# Patient Record
Sex: Male | Born: 1955
Health system: Southern US, Community
[De-identification: ages and names within clinical notes are randomized; demographics above are authoritative.]

## PROBLEM LIST (undated history)

## (undated) DIAGNOSIS — N529 Male erectile dysfunction, unspecified: Secondary | ICD-10-CM

## (undated) DIAGNOSIS — K219 Gastro-esophageal reflux disease without esophagitis: Secondary | ICD-10-CM

## (undated) DIAGNOSIS — E291 Testicular hypofunction: Secondary | ICD-10-CM

## (undated) DIAGNOSIS — G4733 Obstructive sleep apnea (adult) (pediatric): Secondary | ICD-10-CM

## (undated) DIAGNOSIS — G4730 Sleep apnea, unspecified: Secondary | ICD-10-CM

## (undated) DIAGNOSIS — Z9989 Dependence on other enabling machines and devices: Secondary | ICD-10-CM

## (undated) DIAGNOSIS — N400 Enlarged prostate without lower urinary tract symptoms: Secondary | ICD-10-CM

## (undated) DIAGNOSIS — E785 Hyperlipidemia, unspecified: Secondary | ICD-10-CM

## (undated) DIAGNOSIS — T7840XA Allergy, unspecified, initial encounter: Secondary | ICD-10-CM

## (undated) DIAGNOSIS — M1990 Unspecified osteoarthritis, unspecified site: Secondary | ICD-10-CM

## (undated) HISTORY — DX: Unspecified osteoarthritis, unspecified site: M19.90

## (undated) HISTORY — DX: Dependence on other enabling machines and devices: Z99.89

## (undated) HISTORY — PX: CHOLECYSTECTOMY: SHX55

## (undated) HISTORY — DX: Allergy, unspecified, initial encounter: T78.40XA

## (undated) HISTORY — PX: COLONOSCOPY: SHX174

## (undated) HISTORY — DX: Benign prostatic hyperplasia without lower urinary tract symptoms: N40.0

## (undated) HISTORY — DX: Obstructive sleep apnea (adult) (pediatric): G47.33

## (undated) HISTORY — DX: Gastro-esophageal reflux disease without esophagitis: K21.9

## (undated) HISTORY — PX: PILONIDAL CYST EXCISION: SHX744

## (undated) HISTORY — PX: VASECTOMY: SHX75

## (undated) HISTORY — DX: Testicular hypofunction: E29.1

## (undated) HISTORY — PX: EYE SURGERY: SHX253

## (undated) HISTORY — DX: Male erectile dysfunction, unspecified: N52.9

## (undated) HISTORY — DX: Sleep apnea, unspecified: G47.30

## (undated) HISTORY — PX: OTHER SURGICAL HISTORY: SHX169

## (undated) HISTORY — PX: PROSTATE SURGERY: SHX751

## (undated) HISTORY — DX: Hyperlipidemia, unspecified: E78.5

## (undated) HISTORY — PX: GALLBLADDER SURGERY: SHX652

---

## 2006-10-25 ENCOUNTER — Ambulatory Visit (HOSPITAL_COMMUNITY): Admission: RE | Admit: 2006-10-25 | Discharge: 2006-10-25 | Payer: Self-pay | Admitting: *Deleted

## 2006-10-25 ENCOUNTER — Encounter (INDEPENDENT_AMBULATORY_CARE_PROVIDER_SITE_OTHER): Payer: Self-pay | Admitting: *Deleted

## 2006-12-17 ENCOUNTER — Emergency Department (HOSPITAL_COMMUNITY): Admission: EM | Admit: 2006-12-17 | Discharge: 2006-12-18 | Payer: Self-pay | Admitting: Emergency Medicine

## 2008-08-06 ENCOUNTER — Encounter: Payer: Self-pay | Admitting: Pulmonary Disease

## 2008-09-24 ENCOUNTER — Encounter: Payer: Self-pay | Admitting: Pulmonary Disease

## 2008-11-06 ENCOUNTER — Ambulatory Visit: Payer: Self-pay | Admitting: Pulmonary Disease

## 2008-11-06 DIAGNOSIS — R059 Cough, unspecified: Secondary | ICD-10-CM | POA: Insufficient documentation

## 2008-11-06 DIAGNOSIS — R05 Cough: Secondary | ICD-10-CM

## 2008-11-06 DIAGNOSIS — G4733 Obstructive sleep apnea (adult) (pediatric): Secondary | ICD-10-CM | POA: Insufficient documentation

## 2010-11-16 ENCOUNTER — Other Ambulatory Visit: Payer: Self-pay | Admitting: Family Medicine

## 2010-11-16 ENCOUNTER — Ambulatory Visit
Admission: RE | Admit: 2010-11-16 | Discharge: 2010-11-16 | Disposition: A | Discharge status: Home / Self Care | Payer: Self-pay | Source: Ambulatory Visit | Attending: Family Medicine | Admitting: Family Medicine

## 2010-11-16 DIAGNOSIS — R109 Unspecified abdominal pain: Secondary | ICD-10-CM

## 2010-11-16 MED ORDER — IOHEXOL 300 MG/ML  SOLN
125.0000 mL | Freq: Once | INTRAMUSCULAR | Status: AC | PRN
  Administered 2010-11-16: 125 mL via INTRAVENOUS

## 2010-12-18 NOTE — Op Note (Signed)
NAME:  George Garner, George Garner NO.:  [id number]   MEDICAL RECORD NO.:  [id number]          PATIENT TYPE:  AMB   LOCATION:  ENDO                         FACILITY:  [facility]   PHYSICIAN:  Georgiana Spinner, M.D.    DATE OF BIRTH:  [birth year]   DATE OF PROCEDURE:  10/25/2006  DATE OF DISCHARGE:                               OPERATIVE REPORT   PROCEDURE:  Colonoscopy.   INDICATIONS:  Colon cancer screening.   ANESTHESIA:  Fentanyl 25 mcg, Versed 2 mg.   PROCEDURE:  With the patient mildly sedated in the left lateral  decubitus position, a rectal examination was performed which was normal  to my examination.  Subsequently, the Pentax videoscopic colonoscope was  inserted into the rectum and passed under direct vision to the cecum,  identified by ileocecal valve and appendiceal orifice.  It required  abdominal pressure and rotating the patient to his back.  Subsequently,  we photographed the cecum identified by the ileocecal valve and  appendiceal orifice.  From this point, the colonoscope was slowly  withdrawn taking circumferential views of the colonic mucosa stopping  only in the rectum which appeared normal on direct and showed hemorrhoid  on retroflexed view.  The endoscope was straightened and withdrawn.  The  patient's vital signs and pulse oximeter remained stable.  The patient  tolerated procedure well without apparent complication.   FINDINGS:  Internal hemorrhoids, otherwise an unremarkable examination.   PLAN:  See endoscopy note for further details.           ______________________________  Georgiana Spinner, M.D.     GMO/MEDQ  D:  10/25/2006  T:  10/25/2006  Job:  [id number]   cc:   Clydie Braun L. Hal Hope, M.D.

## 2010-12-18 NOTE — Op Note (Signed)
NAME:  George Garner, George Garner NO.:  [id number]   MEDICAL RECORD NO.:  [id number]          PATIENT TYPE:  AMB   LOCATION:  ENDO                         FACILITY:  [facility]   PHYSICIAN:  Georgiana Spinner, M.D.    DATE OF BIRTH:  Oct 03, 1955   DATE OF PROCEDURE:  10/25/2006  DATE OF DISCHARGE:                               OPERATIVE REPORT   PROCEDURE:  Upper endoscopy.   INDICATIONS:  Gastroesophageal reflux disease.   ANESTHESIA:  Fentanyl 75 mcg, Versed 5 mg.   PROCEDURE:  With the patient mildly sedated in the left lateral  decubitus position the Pentax videoscopic endoscope was inserted in the  mouth, passed under direct vision through the esophagus which appeared  normal until we reached the distal esophagus and there were changes of  esophagitis and/or Barrett's.  We could not see the squamocolumnar  junction well.  We elected to biopsy around the parameter as best we  could.  We then entered into the stomach, fundus, body, antrum, duodenal  bulb, second portion duodenum appeared normal.  From this point the  endoscope was slowly withdrawn taking circumferential views of duodenal  mucosa until the endoscope had been pulled back into the stomach, placed  in retroflexion to view the stomach from below.  The endoscope was then  straightened and withdrawn taking circumferential views of the remaining  gastric and esophageal mucosa.  The patient's vital signs, pulse  oximeter remained stable.  The patient tolerated procedure well and left  the operating room without apparent complications.   FINDINGS:  Changes of esophagitis and/or Barrett's esophagus.  Await  biopsy report.  The patient will call me for results and follow-up with  me as an outpatient and proceed to colonoscopy as planned.           ______________________________  Georgiana Spinner, M.D.     GMO/MEDQ  D:  10/25/2006  T:  10/25/2006  Job:  [id number]   cc:   Clydie Braun L. Hal Hope, M.D.

## 2011-12-06 ENCOUNTER — Encounter: Payer: Self-pay | Admitting: Family Medicine

## 2011-12-08 NOTE — Progress Notes (Signed)
This encounter was created in error - please disregard.

## 2011-12-24 ENCOUNTER — Encounter: Payer: Self-pay | Admitting: Family Medicine

## 2011-12-24 ENCOUNTER — Ambulatory Visit (INDEPENDENT_AMBULATORY_CARE_PROVIDER_SITE_OTHER): Payer: BC Managed Care – PPO | Admitting: Family Medicine

## 2011-12-24 VITALS — BP 124/70 | HR 61 | Temp 98.0°F | Resp 16 | Ht 72.0 in | Wt 223.2 lb

## 2011-12-24 DIAGNOSIS — E785 Hyperlipidemia, unspecified: Secondary | ICD-10-CM | POA: Insufficient documentation

## 2011-12-24 DIAGNOSIS — E669 Obesity, unspecified: Secondary | ICD-10-CM

## 2011-12-24 DIAGNOSIS — B079 Viral wart, unspecified: Secondary | ICD-10-CM

## 2011-12-24 DIAGNOSIS — Z0000 Encounter for general adult medical examination without abnormal findings: Secondary | ICD-10-CM

## 2011-12-24 DIAGNOSIS — E291 Testicular hypofunction: Secondary | ICD-10-CM | POA: Insufficient documentation

## 2011-12-24 DIAGNOSIS — N400 Enlarged prostate without lower urinary tract symptoms: Secondary | ICD-10-CM

## 2011-12-24 DIAGNOSIS — K219 Gastro-esophageal reflux disease without esophagitis: Secondary | ICD-10-CM | POA: Insufficient documentation

## 2011-12-24 LAB — COMPREHENSIVE METABOLIC PANEL
Albumin: 4.2 g/dL (ref 3.5–5.2)
Alkaline Phosphatase: 61 U/L (ref 39–117)
BUN: 15 mg/dL (ref 6–23)
CO2: 31 mEq/L (ref 19–32)
Glucose, Bld: 83 mg/dL (ref 70–99)
Potassium: 4.4 mEq/L (ref 3.5–5.3)
Sodium: 144 mEq/L (ref 135–145)
Total Bilirubin: 0.4 mg/dL (ref 0.3–1.2)
Total Protein: 6.9 g/dL (ref 6.0–8.3)

## 2011-12-24 LAB — TSH: TSH: 1.434 u[IU]/mL (ref 0.350–4.500)

## 2011-12-24 LAB — PSA: PSA: 3.3 ng/mL (ref <=4.00)

## 2011-12-24 LAB — TESTOSTERONE: Testosterone: 375.91 ng/dL (ref 300–890)

## 2011-12-24 LAB — LIPID PANEL
Cholesterol: 179 mg/dL (ref 0–200)
Triglycerides: 129 mg/dL (ref <150)
VLDL: 26 mg/dL (ref 0–40)

## 2011-12-24 LAB — POCT URINALYSIS DIPSTICK
Bilirubin, UA: NEGATIVE
Glucose, UA: NEGATIVE
Ketones, UA: NEGATIVE
Leukocytes, UA: NEGATIVE
Nitrite, UA: NEGATIVE

## 2011-12-24 LAB — IFOBT (OCCULT BLOOD): IFOBT: NEGATIVE

## 2011-12-24 NOTE — Patient Instructions (Signed)
Testosterone This test is used to determine if your testosterone level is abnormal. This could be used to explain difficulty getting an erection (erectile dysfunction), inability of your partner to get pregnant (infertility), premature or delayed puberty if you are male, or the appearance of masculine physical features if you are male. PREPARATION FOR TEST A blood sample is obtained by inserting a needle into a vein in the arm. NORMAL FINDINGS  Free Testosterone: 0.3-2 pg/mL   % Free Testosterone: 0.1%-0.3%  Total Testosterone:  7 mos-9 yrs (Tanner Stage I)   Male: Less than 30 ng/dL   Male: Less than 30 ng/dL   11-91 yrs (Tanner Stage II)   Male: Less than 300 ng/dL   Male: Less than 40 ng/dL   47-57 yrs (Tanner Stage III)   Male: 170-540 ng/dL   Male: Less than 60 ng/dL   57-62 yrs (Tanner Stage IV, V)   Male: 250-910 ng/dL   Male: Less than 70 ng/dL   20 yrs and over   Male: [phone] ng/dL   Male: Less than 70 ng/dL  Ranges for normal findings may vary among different laboratories and hospitals. You should always check with your doctor after having lab work or other tests done to discuss the meaning of your test results and whether your values are considered within normal limits. MEANING OF TEST  Your caregiver will go over the test results with you and discuss the importance and meaning of your results, as well as treatment options and the need for additional tests if necessary. OBTAINING THE TEST RESULTS It is your responsibility to obtain your test results. Ask the lab or department performing the test when and how you will get your results. Document Released: 08/05/2004 Document Revised: 07/08/2011 Document Reviewed: 06/30/2008 [employer] Patient Information 2012 ExitCare, [facility]    Hypertriglyceridemia  Diet for High blood levels of Triglycerides Most fats in food are triglycerides. Triglycerides in your blood are stored as fat in your body. High  levels of triglycerides in your blood may put you at a greater risk for heart disease and stroke.  Normal triglyceride levels are less than 150 mg/dL. Borderline high levels are 150-199 mg/dl. High levels are 200 - 499 mg/dL, and very high triglyceride levels are greater than 500 mg/dL. The decision to treat high triglycerides is generally based on the level. For people with borderline or high triglyceride levels, treatment includes weight loss and exercise. Drugs are recommended for people with very high triglyceride levels. Many people who need treatment for high triglyceride levels have metabolic syndrome. This syndrome is a collection of disorders that often include: insulin resistance, high blood pressure, blood clotting problems, high cholesterol and triglycerides. TESTING PROCEDURE FOR TRIGLYCERIDES  You should not eat 4 hours before getting your triglycerides measured. The normal range of triglycerides is between 10 and 250 milligrams per deciliter (mg/dl). Some people may have extreme levels (1000 or above), but your triglyceride level may be too high if it is above 150 mg/dl, depending on what other risk factors you have for heart disease.   People with high blood triglycerides may also have high blood cholesterol levels. If you have high blood cholesterol as well as high blood triglycerides, your risk for heart disease is probably greater than if you only had high triglycerides. High blood cholesterol is one of the main risk factors for heart disease.  CHANGING YOUR DIET  Your weight can affect your blood triglyceride level. If you are more than 20% above your ideal  body weight, you may be able to lower your blood triglycerides by losing weight. Eating less and exercising regularly is the best way to combat this. Fat provides more calories than any other food. The best way to lose weight is to eat less fat. Only 30% of your total calories should come from fat. Less than 7% of your diet should  come from saturated fat. A diet low in fat and saturated fat is the same as a diet to decrease blood cholesterol. By eating a diet lower in fat, you may lose weight, lower your blood cholesterol, and lower your blood triglyceride level.  Eating a diet low in fat, especially saturated fat, may also help you lower your blood triglyceride level. Ask your dietitian to help you figure how much fat you can eat based on the number of calories your caregiver has prescribed for you.  Exercise, in addition to helping with weight loss may also help lower triglyceride levels.   Alcohol can increase blood triglycerides. You may need to stop drinking alcoholic beverages.   Too much carbohydrate in your diet may also increase your blood triglycerides. Some complex carbohydrates are necessary in your diet. These may include bread, rice, potatoes, other starchy vegetables and cereals.   Reduce "simple" carbohydrates. These may include pure sugars, candy, honey, and jelly without losing other nutrients. If you have the kind of high blood triglycerides that is affected by the amount of carbohydrates in your diet, you will need to eat less sugar and less high-sugar foods. Your caregiver can help you with this.   Adding 2-4 grams of fish oil (EPA+ DHA) may also help lower triglycerides. Speak with your caregiver before adding any supplements to your regimen.  Following the Diet  Maintain your ideal weight. Your caregivers can help you with a diet. Generally, eating less food and getting more exercise will help you lose weight. Joining a weight control group may also help. Ask your caregivers for a good weight control group in your area.  Eat low-fat foods instead of high-fat foods. This can help you lose weight too.  These foods are lower in fat. Eat MORE of these:   Dried beans, peas, and lentils.   Egg whites.   Low-fat cottage cheese.   Fish.   Lean cuts of meat, such as round, sirloin, rump, and flank (cut  extra fat off meat you fix).   Whole grain breads, cereals and pasta.   Skim and nonfat dry milk.   Low-fat yogurt.   Poultry without the skin.   Cheese made with skim or part-skim milk, such as mozzarella, parmesan, farmers', ricotta, or pot cheese.  These are higher fat foods. Eat LESS of these:   Whole milk and foods made from whole milk, such as American, blue, cheddar, monterey jack, and swiss cheese   High-fat meats, such as luncheon meats, sausages, knockwurst, bratwurst, hot dogs, ribs, corned beef, ground pork, and regular ground beef.   Fried foods.  Limit saturated fats in your diet. Substituting unsaturated fat for saturated fat may decrease your blood triglyceride level. You will need to read package labels to know which products contain saturated fats.  These foods are high in saturated fat. Eat LESS of these:   Fried pork skins.   Whole milk.   Skin and fat from poultry.   Palm oil.   Butter.   Shortening.   Cream cheese.   Tomasa Blase.   Margarines and baked goods made from listed oils.  Vegetable shortenings.   Chitterlings.   Fat from meats.   Coconut oil.   Palm kernel oil.   Lard.   Cream.   Sour cream.   Fatback.   Coffee whiteners and non-dairy creamers made with these oils.   Cheese made from whole milk.  Use unsaturated fats (both polyunsaturated and monounsaturated) moderately. Remember, even though unsaturated fats are better than saturated fats; you still want a diet low in total fat.  These foods are high in unsaturated fat:   Canola oil.   Sunflower oil.   Mayonnaise.   Almonds.   Peanuts.   Pine nuts.   Margarines made with these oils.   Safflower oil.   Olive oil.   Avocados.   Cashews.   Peanut butter.   Sunflower seeds.   Soybean oil.   Peanut oil.   Olives.   Pecans.   Walnuts.   Pumpkin seeds.  Avoid sugar and other high-sugar foods. This will decrease carbohydrates without decreasing  other nutrients. Sugar in your food goes rapidly to your blood. When there is excess sugar in your blood, your liver may use it to make more triglycerides. Sugar also contains calories without other important nutrients.  Eat LESS of these:   Sugar, brown sugar, powdered sugar, jam, jelly, preserves, honey, syrup, molasses, pies, candy, cakes, cookies, frosting, pastries, colas, soft drinks, punches, fruit drinks, and regular gelatin.   Avoid alcohol. Alcohol, even more than sugar, may increase blood triglycerides. In addition, alcohol is high in calories and low in nutrients. Ask for sparkling water, or a diet soft drink instead of an alcoholic beverage.  Suggestions for planning and preparing meals   Bake, broil, grill or roast meats instead of frying.   Remove fat from meats and skin from poultry before cooking.   Add spices, herbs, lemon juice or vinegar to vegetables instead of salt, rich sauces or gravies.   Use a non-stick skillet without fat or use no-stick sprays.   Cool and refrigerate stews and broth. Then remove the hardened fat floating on the surface before serving.   Refrigerate meat drippings and skim off fat to make low-fat gravies.   Serve more fish.   Use less butter, margarine and other high-fat spreads on bread or vegetables.   Use skim or reconstituted non-fat dry milk for cooking.   Cook with low-fat cheeses.   Substitute low-fat yogurt or cottage cheese for all or part of the sour cream in recipes for sauces, dips or congealed salads.   Use half yogurt/half mayonnaise in salad recipes.   Substitute evaporated skim milk for cream. Evaporated skim milk or reconstituted non-fat dry milk can be whipped and substituted for whipped cream in certain recipes.   Choose fresh fruits for dessert instead of high-fat foods such as pies or cakes. Fruits are naturally low in fat.  When Dining Out   Order low-fat appetizers such as fruit or vegetable juice, pasta with  vegetables or tomato sauce.   Select clear, rather than cream soups.   Ask that dressings and gravies be served on the side. Then use less of them.   Order foods that are baked, broiled, poached, steamed, stir-fried, or roasted.   Ask for margarine instead of butter, and use only a small amount.   Drink sparkling water, unsweetened tea or coffee, or diet soft drinks instead of alcohol or other sweet beverages.  QUESTIONS AND ANSWERS ABOUT OTHER FATS IN THE BLOOD: SATURATED FAT, TRANS FAT, AND CHOLESTEROL What is  trans fat? Trans fat is a type of fat that is formed when vegetable oil is hardened through a process called hydrogenation. This process helps makes foods more solid, gives them shape, and prolongs their shelf life. Trans fats are also called hydrogenated or partially hydrogenated oils.  What do saturated fat, trans fat, and cholesterol in foods have to do with heart disease? Saturated fat, trans fat, and cholesterol in the diet all raise the level of LDL "bad" cholesterol in the blood. The higher the LDL cholesterol, the greater the risk for coronary heart disease (CHD). Saturated fat and trans fat raise LDL similarly.  What foods contain saturated fat, trans fat, and cholesterol? High amounts of saturated fat are found in animal products, such as fatty cuts of meat, chicken skin, and full-fat dairy products like butter, whole milk, cream, and cheese, and in tropical vegetable oils such as palm, palm kernel, and coconut oil. Trans fat is found in some of the same foods as saturated fat, such as vegetable shortening, some margarines (especially hard or stick margarine), crackers, cookies, baked goods, fried foods, salad dressings, and other processed foods made with partially hydrogenated vegetable oils. Small amounts of trans fat also occur naturally in some animal products, such as milk products, beef, and lamb. Foods high in cholesterol include liver, other organ meats, egg yolks, shrimp,  and full-fat dairy products. How can I use the new food label to make heart-healthy food choices? Check the Nutrition Facts panel of the food label. Choose foods lower in saturated fat, trans fat, and cholesterol. For saturated fat and cholesterol, you can also use the Percent Daily Value (%DV): 5% DV or less is low, and 20% DV or more is high. (There is no %DV for trans fat.) Use the Nutrition Facts panel to choose foods low in saturated fat and cholesterol, and if the trans fat is not listed, read the ingredients and limit products that list shortening or hydrogenated or partially hydrogenated vegetable oil, which tend to be high in trans fat. POINTS TO REMEMBER: YOU NEED A LITTLE TLC (THERAPEUTIC LIFESTYLE CHANGES)  Discuss your risk for heart disease with your caregivers, and take steps to reduce risk factors.   Change your diet. Choose foods that are low in saturated fat, trans fat, and cholesterol.   Add exercise to your daily routine if it is not already being done. Participate in physical activity of moderate intensity, like brisk walking, for at least 30 minutes on most, and preferably all days of the week. No time? Break the 30 minutes into three, 10-minute segments during the day.   Stop smoking. If you do smoke, contact your caregiver to discuss ways in which they can help you quit.   Do not use street drugs.   Maintain a normal weight.   Maintain a healthy blood pressure.   Keep up with your blood work for checking the fats in your blood as directed by your caregiver.  Document Released: 05/06/2004 Document Revised: 07/08/2011 Document Reviewed: 12/02/2008 [facility] Patient Information 2012 [place], [place].      Marland KitchenKeeping you healthy  Get these tests  Blood pressure- Have your blood pressure checked once a year by your healthcare provider.  Normal blood pressure is 120/80  Weight- Have your body mass index (BMI) calculated to screen for obesity.  BMI is a measure of body  fat based on height and weight. You can also calculate your own BMI at [email or web address].  Cholesterol- Have your cholesterol checked every year.  Diabetes- Have your blood sugar checked regularly if you have high blood pressure, high cholesterol, have a family history of diabetes or if you are overweight.  Screening for Colon Cancer- Colonoscopy starting at age 84.  Screening may begin sooner depending on your family history and other health conditions. Follow up colonoscopy as directed by your Gastroenterologist.  Screening for Prostate Cancer- Both blood work (PSA) and a rectal exam help screen for Prostate Cancer.  Screening begins at age 39 with African-American men and at age 25 with Caucasian men.  Screening may begin sooner depending on your family history.  Take these medicines  Aspirin- One aspirin daily can help prevent Heart disease and Stroke.  Flu shot- Every fall.  Tetanus- Every 10 years.  Zostavax- Once after the age of 16 to prevent Shingles.  Pneumonia shot- Once after the age of 29; if you are younger than 67, ask your healthcare provider if you need a Pneumonia shot.  Take these steps  Don't smoke- If you do smoke, talk to your doctor about quitting.  For tips on how to quit, go to www.smokefree.gov or call 1-800-QUIT-NOW.  Be physically active- Exercise 5 days a week for at least 30 minutes.  If you are not already physically active start slow and gradually work up to 30 minutes of moderate physical activity.  Examples of moderate activity include walking briskly, mowing the yard, dancing, swimming, bicycling, etc.  Eat a healthy diet- Eat a variety of healthy food such as fruits, vegetables, low fat milk, low fat cheese, yogurt, lean meant, poultry, fish, beans, tofu, etc. For more information go to www.thenutritionsource.org  Drink alcohol in moderation- Limit alcohol intake to less than two drinks a day. Never drink and drive.  Dentist- Brush and floss  twice daily; visit your dentist twice a year.  Depression- Your emotional health is as important as your physical health. If you're feeling down, or losing interest in things you would normally enjoy please talk to your healthcare provider.  Eye exam- Visit your eye doctor every year.  Safe sex- If you may be exposed to a sexually transmitted infection, use a condom.  Seat belts- Seat belts can save your life; always wear one.  Smoke/Carbon Monoxide detectors- These detectors need to be installed on the appropriate level of your home.  Replace batteries at least once a year.  Skin cancer- When out in the sun, cover up and use sunscreen 15 SPF or higher.  Violence- If anyone is threatening you, please tell your healthcare provider.  Living Will/ Health care power of attorney- Speak with your healthcare provider and family.

## 2011-12-24 NOTE — Progress Notes (Signed)
Subjective:    Patient ID: George Garner, male    DOB: 30-Nov-1955, 56 y.o.   MRN: [id number]  HPI This 56 y.o. Cauc male is here for CPE and labs. He takes Zyrtec for year-round allergies and   Niaspan for elevated Trigylcerides. Currently has a "cold" and thinks he picked up a virus from his   67 y.o. son who was recently ill. He has managed to lose 30 pounds since last CPE but is "stuck  at" current weight and is concerned about this. He has seen Urology re: low Testosterone/ PSA=3.96  and nothing has been diagnosed; was told his prostate is 'large".        Pt is a Optician, dispensing, is married and has alcohol < 1x/week. He exercises 6 days/week.     Review of Systems  Constitutional: Negative.   HENT: Positive for congestion, sore throat, rhinorrhea and sinus pressure. Negative for ear pain.        Nasal discharge is yellow  Eyes: Negative.   Respiratory: Negative.   Cardiovascular: Negative.   Gastrointestinal: Negative.   Genitourinary:       Nocturia, hesitancy and dribbling  Musculoskeletal: Positive for gait problem. Negative for back pain and arthralgias.       "problems walking"  Skin: Negative for pallor and rash.       Wart on right thumb that was treated with cryo 1 1/2 years ago; it has reoccured  Neurological: Negative.   Hematological: Negative.   Psychiatric/Behavioral: Negative.        Objective:   Physical Exam  Nursing note and vitals reviewed. Constitutional: He is oriented to person, place, and time. He appears well-developed and well-nourished. No distress.  HENT:  Head: Normocephalic and atraumatic.  Right Ear: External ear normal.  Left Ear: External ear normal.  Nose: Nose normal.  Mouth/Throat: Oropharynx is clear and moist.  Eyes: Conjunctivae and EOM are normal. Pupils are equal, round, and reactive to light. No scleral icterus.  Neck: Normal range of motion. Neck supple. No JVD present.  Cardiovascular: Normal rate, normal heart sounds and intact distal  pulses.  Exam reveals no gallop and no friction rub.   No murmur heard. Pulmonary/Chest: Effort normal and breath sounds normal. No respiratory distress. He has no wheezes.  Abdominal: Soft. He exhibits no mass. There is no tenderness. There is no guarding. Hernia confirmed negative in the right inguinal area and confirmed negative in the left inguinal area.       No organomegaly  Genitourinary: Rectum normal, testes normal and penis normal. Rectal exam shows no external hemorrhoid, no internal hemorrhoid, no fissure, no mass, no tenderness and anal tone normal. Guaiac negative stool. Prostate is enlarged. Prostate is not tender. Right testis shows no mass, no swelling and no tenderness. Right testis is descended. Left testis shows no mass, no swelling and no tenderness. Left testis is descended.  Musculoskeletal: Normal range of motion. He exhibits no edema and no tenderness.  Lymphadenopathy:    He has no cervical adenopathy.       Right: No inguinal adenopathy present.       Left: No inguinal adenopathy present.  Neurological: He is alert and oriented to person, place, and time. He has normal reflexes. No cranial nerve deficit. He exhibits normal muscle tone. Coordination normal.  Skin: Skin is warm and dry. No erythema. No pallor.  Psychiatric: He has a normal mood and affect. His behavior is normal. Judgment and thought content normal.  ECG: sinus bradycardia, otherwise normal.   Procedure note: Using Cryo cannister, wart-like lesion on R thumb was treated with freeze-thaw cycle x 4     Assessment & Plan:   1. Routine general medical examination at a health care facility    2. Dyslipidemia  Comprehensive metabolic panel, Lipid panel  3. Hypogonadism male - pt has been evaluated at [facility] in May 2012 Testosterone level; Urology advised weight loss, no replacement and use of Levitra prn  4. BPH (benign prostatic hypertrophy)  PSA; conservative treatment by Urology  witsurveillance  5. Obesity (BMI 30.0-34.9)  Vitamin D, 25-hydroxy, TSH  6. Viral wart on right thumb  Cryo-destruction       RX: Zostavax given to pt

## 2011-12-26 NOTE — Progress Notes (Signed)
Quick Note:  Please call pt and advise that the following labs are abnormal...  Testosterone is still in the low normal range. HDL("good") cholesterol is too low and LDL("bad") chol is too high. Total Chol is below 200 which is very good. Triglycerides are excellent with Niaspan  You can try OTC Omega 3 Fish Oil 1200 mg 1 capsule daily taken at a different time than other medications. This may help raise your HDL; continue regular exercise.  All other labs are normal. Your PSA was 3.96 last time and is lower (3.30) this time.  Copy to pt. ______

## 2011-12-30 ENCOUNTER — Encounter: Payer: Self-pay | Admitting: Family Medicine

## 2012-01-14 ENCOUNTER — Telehealth: Payer: Self-pay

## 2012-01-14 MED ORDER — NIACIN ER (ANTIHYPERLIPIDEMIC) 500 MG PO TBCR: 500.0000 mg | EXTENDED_RELEASE_TABLET | Freq: Every day | ORAL | Status: DC

## 2012-01-14 NOTE — Telephone Encounter (Signed)
Pt is requesting refill of Niaspan. Please send to [facility] at [address]

## 2012-01-14 NOTE — Telephone Encounter (Signed)
Patient just seen recently for this-- ok to refill.

## 2012-01-26 ENCOUNTER — Ambulatory Visit (INDEPENDENT_AMBULATORY_CARE_PROVIDER_SITE_OTHER): Payer: BC Managed Care – PPO | Admitting: Emergency Medicine

## 2012-01-26 ENCOUNTER — Ambulatory Visit: Payer: BC Managed Care – PPO

## 2012-01-26 VITALS — BP 130/71 | HR 71 | Temp 98.3°F | Resp 16 | Ht 72.0 in | Wt 231.6 lb

## 2012-01-26 DIAGNOSIS — J339 Nasal polyp, unspecified: Secondary | ICD-10-CM

## 2012-01-26 DIAGNOSIS — J329 Chronic sinusitis, unspecified: Secondary | ICD-10-CM

## 2012-01-26 DIAGNOSIS — J309 Allergic rhinitis, unspecified: Secondary | ICD-10-CM

## 2012-01-26 MED ORDER — FLUTICASONE PROPIONATE 50 MCG/ACT NA SUSP: 2.0000 | Freq: Every day | NASAL | Status: DC

## 2012-01-26 MED ORDER — LEVOFLOXACIN 500 MG PO TABS: 500.0000 mg | ORAL_TABLET | Freq: Every day | ORAL | Status: AC

## 2012-01-26 MED ORDER — PREDNISONE 20 MG PO TABS: ORAL_TABLET | ORAL | Status: DC

## 2012-01-26 NOTE — Progress Notes (Signed)
  Subjective:    Patient ID: George Garner, male    DOB: [birth year], 56 y.o.   MRN: [id number]  HPI patient states that one month ago he developed sinus congestion since that time he has been unable to breathe through the left side of his nose. He thinks initial episode was triggered by cat exposure. Patient also would like a refill on his Cialis .    Review of Systems     Objective:   Physical Exam there is a large polyp present in the left side of the nose. TMs are both occluded with wax. The throat is normal chest is clear to auscultation  [facility] reading (PRIMARY) by  Dr. 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 We'll put on prednisone Dosepak as well as Flonase nasal spray. Conclusion persistent he will have to see ENT for removal of the polyp. Her to see if there is any evidence of maxillary sinus obstruction. She shows complete blockage of the left maxillary sinus. Patient will be on prednisone Dosepak Levaquin and Flonase. He is going to decide if he wants to followup here in 2 weeks for repeat film or whether he wants to see ENT and he will let us know test requested

## 2012-03-13 ENCOUNTER — Ambulatory Visit (INDEPENDENT_AMBULATORY_CARE_PROVIDER_SITE_OTHER): Payer: BC Managed Care – PPO | Admitting: Family Medicine

## 2012-03-13 VITALS — BP 124/72 | HR 69 | Temp 99.1°F | Resp 18 | Ht 72.0 in | Wt 229.0 lb

## 2012-03-13 DIAGNOSIS — J310 Chronic rhinitis: Secondary | ICD-10-CM

## 2012-03-13 DIAGNOSIS — J069 Acute upper respiratory infection, unspecified: Secondary | ICD-10-CM

## 2012-03-13 MED ORDER — IPRATROPIUM BROMIDE 0.06 % NA SOLN: 2.0000 | Freq: Four times a day (QID) | NASAL | Status: DC

## 2012-03-13 NOTE — Progress Notes (Signed)
  Subjective:    Patient ID: ABDALLAH HERN, male    DOB: Apr 20, 1956, 56 y.o.   MRN: [id number]  HPI Nasal congestion past 4 days. Some cough.  2 boys have been sick as well recently.  Using flonase - 1 spray in each nostril each day.   Seen few weeks ago - dx with nasal polyp and congestion - improved with treatment last time almost instantly. (received prednisone taper)  No fever.    Review of Systems  Constitutional: Negative for fever and chills.  HENT: Positive for congestion (clear at times, yellow at times. ), rhinorrhea and sneezing.   Respiratory: Positive for cough. Negative for shortness of breath and wheezing.        Objective:   Physical Exam  Constitutional: He is oriented to person, place, and time. He appears well-developed and well-nourished.  HENT:  Head: Normocephalic and atraumatic.  Right Ear: Tympanic membrane, external ear and ear canal normal.  Left Ear: Tympanic membrane, external ear and ear canal normal.  Nose: No rhinorrhea.  Mouth/Throat: Oropharynx is clear and moist and mucous membranes are normal. No oropharyngeal exudate or posterior oropharyngeal erythema.  Eyes: Conjunctivae are normal. Pupils are equal, round, and reactive to light.  Neck: Neck supple.  Cardiovascular: Normal rate, regular rhythm, normal heart sounds and intact distal pulses.   No murmur heard. Pulmonary/Chest: Effort normal and breath sounds normal. He has no wheezes. He has no rhonchi. He has no rales.  Abdominal: Soft. There is no tenderness.  Lymphadenopathy:    He has no cervical adenopathy.  Neurological: He is alert and oriented to person, place, and time.  Skin: Skin is warm and dry. No rash noted.  Psychiatric: He has a normal mood and affect. His behavior is normal.       Assessment & Plan:  ADONIAS DEMORE is a 56 y.o. male 1. URI (upper respiratory infection)  ipratropium (ATROVENT) 0.06 % nasal spray  2. Rhinitis  ipratropium (ATROVENT) 0.06 % nasal spray   Sx care,.   Holding off on repeat prednisone at this point, but if not improving in next week, could reconsider. rtc precautions discussed.   Patient Instructions  You can increase the flonase to 2 sprays once per day.  atrovent nasal spray as needed for congestion and runny nose. Saline nasal spray atleast 4 times per day, over the counter mucinex or mucinex DM if needed for cough.  Drink plenty of fluids.  Return to the clinic or go to the nearest emergency room if any of your symptoms worsen or new symptoms occur.

## 2012-03-13 NOTE — Patient Instructions (Signed)
You can increase the flonase to 2 sprays once per day.  atrovent nasal spray as needed for congestion and runny nose. Saline nasal spray atleast 4 times per day, over the counter mucinex or mucinex DM if needed for cough.  Drink plenty of fluids.  Return to the clinic or go to the nearest emergency room if any of your symptoms worsen or new symptoms occur.

## 2012-08-21 ENCOUNTER — Telehealth: Payer: Self-pay

## 2012-08-21 MED ORDER — NIACIN ER (ANTIHYPERLIPIDEMIC) 500 MG PO TBCR: 500.0000 mg | EXTENDED_RELEASE_TABLET | Freq: Every day | ORAL | Status: DC

## 2012-08-21 NOTE — Telephone Encounter (Signed)
The following email was submitted to your website from George Garner I have been on a prescription for Niaspan 500 mg for cholesterol.  While at another doctor, Melvyn Novas, I mentioned that I was about out, so she wrote me a prescription for it.  Now that prescription has run out.  I called in to [facility] to get it refilled, but after standing in line for over 10 minutes and not seeing any movement in the line, I gave up.  Would it be possible to call in a prescription at another pharmacy?  The [facility] would be fine.    Thanks  George Garner Date of birth: [birth year]

## 2012-08-21 NOTE — Telephone Encounter (Signed)
Sent in 1 mos RF and notified pt on home VM that we sent in RF to [facility] on [address] and that he is due for f/up before more RFs are needed.

## 2012-09-22 ENCOUNTER — Ambulatory Visit (INDEPENDENT_AMBULATORY_CARE_PROVIDER_SITE_OTHER): Payer: BC Managed Care – PPO | Admitting: Family Medicine

## 2012-09-22 VITALS — BP 132/68 | HR 66 | Temp 98.2°F | Resp 18 | Ht 72.0 in | Wt 223.0 lb

## 2012-09-22 DIAGNOSIS — E785 Hyperlipidemia, unspecified: Secondary | ICD-10-CM

## 2012-09-22 DIAGNOSIS — J069 Acute upper respiratory infection, unspecified: Secondary | ICD-10-CM

## 2012-09-22 DIAGNOSIS — J310 Chronic rhinitis: Secondary | ICD-10-CM

## 2012-09-22 DIAGNOSIS — Z13220 Encounter for screening for lipoid disorders: Secondary | ICD-10-CM

## 2012-09-22 DIAGNOSIS — J309 Allergic rhinitis, unspecified: Secondary | ICD-10-CM

## 2012-09-22 DIAGNOSIS — M542 Cervicalgia: Secondary | ICD-10-CM

## 2012-09-22 DIAGNOSIS — G5600 Carpal tunnel syndrome, unspecified upper limb: Secondary | ICD-10-CM

## 2012-09-22 LAB — COMPREHENSIVE METABOLIC PANEL
BUN: 23 mg/dL (ref 6–23)
CO2: 31 mEq/L (ref 19–32)
Calcium: 9.9 mg/dL (ref 8.4–10.5)
Chloride: 105 mEq/L (ref 96–112)
Creat: 1.06 mg/dL (ref 0.50–1.35)
Glucose, Bld: 98 mg/dL (ref 70–99)

## 2012-09-22 LAB — LIPID PANEL
LDL Cholesterol: 140 mg/dL — ABNORMAL HIGH (ref 0–99)
Triglycerides: 95 mg/dL (ref <150)
VLDL: 19 mg/dL (ref 0–40)

## 2012-09-22 MED ORDER — FLUTICASONE PROPIONATE 50 MCG/ACT NA SUSP: 2.0000 | Freq: Every day | NASAL | Status: DC

## 2012-09-22 MED ORDER — NIACIN ER (ANTIHYPERLIPIDEMIC) 500 MG PO TBCR: EXTENDED_RELEASE_TABLET | ORAL | Status: DC

## 2012-09-22 NOTE — Progress Notes (Signed)
Subjective: Patient is here for a number of things. He denies headaches. He has some decreased hearing. Always has wax in his ears and these were irrigated. His neck is a little sore. He has a history of arthritic problems there. He has a numbness and tingling into the middle fingers of his left hand. He works on a Animator a lot. When he leaned down to pick something up it seems to be the worst. He hasn't had history of hyperlipidemia. Doesn't have to keep you apprised for the niacin it is not doing him any good. He has chronic allergic rhinitis and uses medications for that, but really has not been using the nose spray regularly lately and just takes the Zyrtec OTC. He is a Musician. He says the work hardening lost weight, about 40 pounds over 6 months, but since then he is plateaued just cannot lose. He has questions about that.  Objective: Pleasant gentleman in no major distress. TMs are both occluded by [facility]. Later on it was all irrigated out drugs could be well visualized. There is a little red streak on the left eardrum, maybe where the Lexapro loose or the pressure of the spray irritated. His throat was clear. Neck little bit decreased range of motion.. Chest clear. Heart regular murmurs. Motor is normal in his hands. Sensory grossly normal. Grip is good.  Assessment: Probable carpal tunnel syndrome Headaches Cerumen impaction, irrigated Allergic rhinitis Cervical Hyperlipidemia  Plan: Check labs Wear the splint at night. If CtS symptoms persist we will need to consider getting nerve conduction studies and/or cervical imaging.

## 2012-09-22 NOTE — Patient Instructions (Addendum)
Try to use Debrox in your ears every 3 or 4 months on a scheduled basis to see if you can keep the wax from building back up  Wear the wrist splint every night for at least several weeks and see how the discomfort in the fingers does.  Try over-the-counter time released niacin and see how that does you. Ask the pharmacist if they do have a generic that is inexpensive  Take Aleve (naproxen) 2 pills twice daily with food to see if that will help you neck. Try for a couple of weeks if it is not affecting your stomach. If it helps, then start tapering the dose back.  Continued to get the exercise and work on weight loss.

## 2012-09-23 ENCOUNTER — Encounter: Payer: Self-pay | Admitting: Family Medicine

## 2012-09-25 ENCOUNTER — Ambulatory Visit (INDEPENDENT_AMBULATORY_CARE_PROVIDER_SITE_OTHER): Payer: BC Managed Care – PPO | Admitting: Physician Assistant

## 2012-09-25 DIAGNOSIS — H6690 Otitis media, unspecified, unspecified ear: Secondary | ICD-10-CM

## 2012-09-25 MED ORDER — AZITHROMYCIN 250 MG PO TABS: ORAL_TABLET | ORAL | Status: DC

## 2012-09-25 NOTE — Patient Instructions (Signed)
Continue the Flonase and resume the Atrovent nasal spray.  Get plenty of rest and drink at least 64 ounces of water daily.

## 2012-09-25 NOTE — Progress Notes (Signed)
  Subjective:    Patient ID: George Garner, male    DOB: [birth year], 57 y.o.   MRN: [id number]  HPI  This 57 y.o. male presents for evaluation of LEFT ear pain that began after having his ears irrigated 09/22/2012.  He notes that he had wax in both ears and while they were both cleared, at the end of the procedure the pain was so severe in the LEFT ear that he thought he might faint.  The LEFT ear feels full, muffled.  Not painful presently.  Reports he's never had an ear infection.  No nasal congestion, drainage, sore throat, cough.   Past Medical History  Diagnosis Date  . GERD (gastroesophageal reflux disease)   . Dyslipidemia   . Hypogonadism male   . OSA on CPAP   . Obstructive sleep apnea     Past Surgical History  Procedure Laterality Date  . Gallbladder surgery    . Pilonidal cyst excision    . Vasectomy    . Eye surgery    . Cholecystectomy      Prior to Admission medications   Medication Sig Start Date End Date Taking? Authorizing Provider  aspirin 81 MG tablet Take 81 mg by mouth daily.   Yes Historical Provider, MD  cetirizine (ZYRTEC) 10 MG tablet Take 10 mg by mouth daily.   Yes Historical Provider, MD  fluticasone (FLONASE) 50 MCG/ACT nasal spray Place 2 sprays into the nose daily. 09/22/12 09/22/13 Yes Peyton Najjar, MD  Multiple Vitamin (MULTIVITAMIN) tablet Take 1 tablet by mouth daily.   Yes Historical Provider, MD  niacin (NIASPAN) 500 MG CR tablet Take daily for lipids 09/22/12  Yes Peyton Najjar, MD  ipratropium (ATROVENT) 0.06 % nasal spray Place 2 sprays into the nose 4 (four) times daily. 03/13/12 03/13/13 NO Shade Flood, MD    Allergies  Allergen Reactions  . Penicillins Rash    History   Social History  . Marital Status: Married   Occupational History  . minister    Social History Main Topics  . Smoking status: Former Games developer  . Smokeless tobacco: Not on file  . Alcohol Use: Yes  . Drug Use: No  . Sexually Active: Yes    Birth Control/  Protection: Other-see comments     Comment: vasectomy    Family History  Problem Relation Age of Onset  . Diabetes Mother     Review of Systems As above.    Objective:   Physical Exam  Blood pressure 147/76, pulse 55, temperature 98.2 F (36.8 C), temperature source Oral, resp. rate 16, height 6' (1.829 m), weight 226 lb (102.513 kg), SpO2 98.00%. Body mass index is 30.64 kg/(m^2). Well-developed, well nourished WM who is awake, alert and oriented, in NAD. HEENT: [place]/AT, PERRL, EOMI.  Sclera and conjunctiva are clear.  EAC are patent, RIGHT TM is normal in appearance. LEFT TM is injected, dull and mildly bulging. Nasal mucosa is pink and moist. OP is clear. Neck: supple, non-tender, no lymphadenopathy, thyromegaly. Lungs: normal effort Extremities: no cyanosis, clubbing or edema. Skin: warm and dry without rash. Psychologic: good mood and appropriate affect, normal speech and behavior.     Assessment & Plan:  AOM (acute otitis media), left - Plan: azithromycin (ZITHROMAX) 250 MG tablet  Continue Flonase.  Restart Atrovent nasal spray.  Rest, fluids.  Anticipatory guidance provided.

## 2012-09-29 ENCOUNTER — Ambulatory Visit (INDEPENDENT_AMBULATORY_CARE_PROVIDER_SITE_OTHER): Payer: BC Managed Care – PPO | Admitting: Family Medicine

## 2012-09-29 VITALS — BP 134/56 | HR 82 | Temp 97.8°F | Resp 14 | Ht 71.5 in | Wt 227.0 lb

## 2012-09-29 DIAGNOSIS — H6692 Otitis media, unspecified, left ear: Secondary | ICD-10-CM

## 2012-09-29 DIAGNOSIS — H6690 Otitis media, unspecified, unspecified ear: Secondary | ICD-10-CM

## 2012-09-29 MED ORDER — CEFDINIR 300 MG PO CAPS: 300.0000 mg | ORAL_CAPSULE | Freq: Two times a day (BID) | ORAL | Status: DC

## 2012-09-29 NOTE — Progress Notes (Signed)
[facility]:  Office Visit  Chief Complaint:  Chief Complaint  Patient presents with  . Otalgia    left ear; not feeling better after antibiotics    HPI: George Garner is a 56 y.o. male who complains of  Left ear AOM, not resolved from prior cerumen irrigationa nd also azithromycin treatment. Denies fevers, chills  Past Medical History  Diagnosis Date  . GERD (gastroesophageal reflux disease)   . Dyslipidemia   . Hypogonadism male   . OSA on CPAP   . Obstructive sleep apnea    Past Surgical History  Procedure Laterality Date  . Gallbladder surgery    . Pilonidal cyst excision    . Vasectomy    . Eye surgery    . Cholecystectomy     History   Social History  . Marital Status: Married    Spouse Name: N/A    Number of Children: N/A  . Years of Education: N/A   Occupational History  . minister    Social History Main Topics  . Smoking status: Former Games developer  . Smokeless tobacco: None  . Alcohol Use: Yes  . Drug Use: No  . Sexually Active: Yes    Birth Control/ Protection: Other-see comments     Comment: vasectomy   Other Topics Concern  . None   Social History Narrative  . None   Family History  Problem Relation Age of Onset  . Diabetes Mother    Allergies  Allergen Reactions  . Penicillins Rash   Prior to Admission medications   Medication Sig Start Date End Date Taking? Authorizing Provider  aspirin 81 MG tablet Take 81 mg by mouth daily.   Yes Historical Provider, MD  azithromycin (ZITHROMAX) 250 MG tablet Take 2 tabs PO x 1 dose, then 1 tab PO QD x 4 days 09/25/12  Yes Chelle S Jeffery, PA-C  cetirizine (ZYRTEC) 10 MG tablet Take 10 mg by mouth daily.   Yes Historical Provider, MD  fluticasone (FLONASE) 50 MCG/ACT nasal spray Place 2 sprays into the nose daily. 09/22/12 09/22/13 Yes Peyton Najjar, MD  ipratropium (ATROVENT) 0.06 % nasal spray Place 2 sprays into the nose 4 (four) times daily. 03/13/12 03/13/13 Yes Shade Flood, MD   Multiple Vitamin (MULTIVITAMIN) tablet Take 1 tablet by mouth daily.   Yes Historical Provider, MD  niacin (NIASPAN) 500 MG CR tablet Take daily for lipids 09/22/12  Yes Peyton Najjar, MD     ROS: The patient denies fevers, chills, night sweats, unintentional weight loss, chest pain, palpitations, wheezing, dyspnea on exertion, nausea, vomiting, abdominal pain, dysuria, hematuria, melena, numbness, weakness, or tingling.   All other systems have been reviewed and were otherwise negative with the exception of those mentioned in the HPI and as above.    PHYSICAL EXAM: Filed Vitals:   09/29/12 0956  BP: 134/56  Pulse: 82  Temp: 97.8 F (36.6 C)  Resp: 14   Filed Vitals:   09/29/12 0956  Height: 5' 11.5" (1.816 m)  Weight: 227 lb (102.967 kg)   Body mass index is 31.22 kg/(m^2).  General: Alert, no acute distress HEENT:  Normocephalic, atraumatic, oropharynx patent. Left TM red, dull. No exudates Cardiovascular:  Regular rate and rhythm, no rubs murmurs or gallops.  No Carotid bruits, radial pulse intact. No pedal edema.  Respiratory: Clear to auscultation bilaterally.  No wheezes, rales, or rhonchi.  No cyanosis, no use of accessory musculature GI: No organomegaly, abdomen is soft and  non-tender, positive bowel sounds.  No masses. Skin: No rashes. Neurologic: Facial musculature symmetric. Psychiatric: Patient is appropriate throughout our interaction. Lymphatic: No cervical lymphadenopathy Musculoskeletal: Gait intact.   LABS: Results for orders placed in visit on 09/22/12  COMPREHENSIVE METABOLIC PANEL      Result Value Range   Sodium 141  135 - 145 mEq/L   Potassium 5.2  3.5 - 5.3 mEq/L   Chloride 105  96 - 112 mEq/L   CO2 31  19 - 32 mEq/L   Glucose, Bld 98  70 - 99 mg/dL   BUN 23  6 - 23 mg/dL   Creat 0.98  1.19 - 1.47 mg/dL   Total Bilirubin 0.7  0.3 - 1.2 mg/dL   Alkaline Phosphatase 55  39 - 117 U/L   AST 18  0 - 37 U/L   ALT 19  0 - 53 U/L   Total Protein  7.1  6.0 - 8.3 g/dL   Albumin 4.5  3.5 - 5.2 g/dL   Calcium 9.9  8.4 - 82.9 mg/dL  LIPID PANEL      Result Value Range   Cholesterol 201 (*) 0 - 200 mg/dL   Triglycerides 95  <562 mg/dL   HDL 42  >13 mg/dL   Total CHOL/HDL Ratio 4.8     VLDL 19  0 - 40 mg/dL   LDL Cholesterol 086 (*) 0 - 99 mg/dL     EKG/XRAY:   Primary read interpreted by Dr. Conley Rolls at [facility].   ASSESSMENT/PLAN: Encounter Diagnosis  Name Primary?  . Left otitis media Yes   D/w patient labs D/w patient risks and benefits of taking omnicef with pcn allergy, he states he had a mild rash as a teenager. NO SOB, CP Rx Omnicef 300 mg BID x 7 days F/u prn    [name], DO 09/29/2012 10:38 AM

## 2012-10-09 ENCOUNTER — Ambulatory Visit (INDEPENDENT_AMBULATORY_CARE_PROVIDER_SITE_OTHER): Payer: BC Managed Care – PPO | Admitting: Physician Assistant

## 2012-10-09 VITALS — BP 128/60 | HR 56 | Temp 98.1°F | Resp 16 | Ht 70.0 in | Wt 224.0 lb

## 2012-10-09 DIAGNOSIS — H9190 Unspecified hearing loss, unspecified ear: Secondary | ICD-10-CM

## 2012-10-09 DIAGNOSIS — H9193 Unspecified hearing loss, bilateral: Secondary | ICD-10-CM

## 2012-10-09 DIAGNOSIS — H6980 Other specified disorders of Eustachian tube, unspecified ear: Secondary | ICD-10-CM

## 2012-10-09 DIAGNOSIS — H6983 Other specified disorders of Eustachian tube, bilateral: Secondary | ICD-10-CM

## 2012-10-09 NOTE — Progress Notes (Signed)
   [address] [place] [address]   Phone [phone]  Subjective:    Patient ID: George Garner, male    DOB: 1955/11/24, 57 y.o.   MRN: [id number]  HPI  Pt presents to clinic with concerns regarding his ear.  Since he had an ear irrigation on 2/12 he has not felt like his hearing is back to normal.  He knows he has trouble hearing but now he can hear himself more than normal, like an echo in his head.  He is having no current nasal congestion but he is on daily Flonase and Atrovent for allergies.  Review of Systems  HENT: Positive for hearing loss. Negative for ear pain, congestion and ear discharge.   Neurological: Negative for headaches.       Objective:   Physical Exam  Vitals reviewed. Constitutional: He is oriented to person, place, and time. He appears well-developed and well-nourished.  HENT:  Head: Normocephalic and atraumatic.  Right Ear: Hearing, external ear and ear canal normal. Tympanic membrane is bulging (serous fluids, dull). Tympanic membrane is not injected and not erythematous.  Left Ear: Hearing, external ear and ear canal normal. Tympanic membrane is bulging (serous fluids, with a scab of cerumen along the inferior side of the TM).  Weber - Lateralization to R ear, pt states he does not hear anything in the L ear Rhinne - AC>BC bilaterally  Eyes: Conjunctivae are normal.  Neck: Neck supple.  Pulmonary/Chest: Effort normal.  Neurological: He is alert and oriented to person, place, and time.  Skin: Skin is warm and dry.  Psychiatric: He has a normal mood and affect. His behavior is normal. Judgment and thought content normal.    Audiometry performed and pt has moderately severe hearing loss in range > 3K bilaterally.      Assessment & Plan:  Hearing loss and ETD - pt is on Flonase but he is using half dose so he will increase his dose to 2 sprays each nostril daily  He will also take a 12h sudafed in the am.  We will do a referral for ENT due to use of  Flonase and still ETD and significant hearing loss which may improve with reduction in ETD but I do not expect it to resolve especially since the hearing loss is not reduced across all the ranges.

## 2013-02-21 ENCOUNTER — Encounter: Payer: BC Managed Care – PPO | Admitting: Family Medicine

## 2013-04-04 ENCOUNTER — Encounter: Payer: Self-pay | Admitting: Family Medicine

## 2013-04-04 ENCOUNTER — Ambulatory Visit (INDEPENDENT_AMBULATORY_CARE_PROVIDER_SITE_OTHER): Payer: BC Managed Care – PPO | Admitting: Family Medicine

## 2013-04-04 VITALS — BP 125/69 | HR 72 | Temp 98.4°F | Resp 16 | Ht 72.0 in | Wt 213.0 lb

## 2013-04-04 DIAGNOSIS — R195 Other fecal abnormalities: Secondary | ICD-10-CM

## 2013-04-04 DIAGNOSIS — N400 Enlarged prostate without lower urinary tract symptoms: Secondary | ICD-10-CM

## 2013-04-04 DIAGNOSIS — Z0000 Encounter for general adult medical examination without abnormal findings: Secondary | ICD-10-CM

## 2013-04-04 DIAGNOSIS — E785 Hyperlipidemia, unspecified: Secondary | ICD-10-CM

## 2013-04-04 LAB — LIPID PANEL
HDL: 44 mg/dL (ref >39)
LDL Cholesterol: 113 mg/dL — ABNORMAL HIGH (ref 0–99)

## 2013-04-04 LAB — POCT URINALYSIS DIPSTICK
Bilirubin, UA: NEGATIVE
Glucose, UA: NEGATIVE
Leukocytes, UA: NEGATIVE
Nitrite, UA: NEGATIVE
Urobilinogen, UA: 0.2

## 2013-04-04 LAB — COMPREHENSIVE METABOLIC PANEL
ALT: 23 U/L (ref 0–53)
AST: 24 U/L (ref 0–37)
Albumin: 4.2 g/dL (ref 3.5–5.2)
Alkaline Phosphatase: 56 U/L (ref 39–117)
BUN: 12 mg/dL (ref 6–23)
Calcium: 9.4 mg/dL (ref 8.4–10.5)
Chloride: 105 mEq/L (ref 96–112)
Potassium: 4.7 mEq/L (ref 3.5–5.3)
Sodium: 142 mEq/L (ref 135–145)

## 2013-04-04 LAB — IFOBT (OCCULT BLOOD): IFOBT: NEGATIVE

## 2013-04-04 LAB — T4, FREE: Free T4: 1.04 ng/dL (ref 0.80–1.80)

## 2013-04-04 NOTE — Progress Notes (Signed)
Subjective:    Patient ID: George Garner, male    DOB: Sep 22, 1955, 57 y.o.   MRN: [id number]  HPI  This 57 y.o. Cauc male is here for CPE; he is in good health but is not pleased that his  weight loss has stalled despite his best efforts. He would like to see 1 lb/week weight loss.  He does dome aerobic exercises several times a week but is not strength training. His caloric  intake is ~ 1900 calories/day. Another concern is abnormal stools; he reports sporadic liquid  stools not related to any particular food ingestion. He denies hematochezia or melena or  fever/chills, n/v or abd pain with liquid stools.    HCM: CRS-Current (normal).            IMM- Current.  Patient Active Problem List   Diagnosis Date Noted  . GERD (gastroesophageal reflux disease)   . Dyslipidemia   . HYPOGODADISM MALE   . OBSTRUCTIVE SLEEP APNEA 11/06/2008  . COUGH 11/06/2008    PMHx, Soc Hx and Fam Hx reviewed. Medications reconciled.   Review of Systems  Gastrointestinal:       As per HPI; upper abd pressure but no pain.  All other systems reviewed and are negative.       Objective:   Physical Exam  Nursing note and vitals reviewed. Constitutional: He is oriented to person, place, and time. Vital signs are normal. He appears well-developed and well-nourished. No distress.  HENT:  Head: Normocephalic and atraumatic.  Right Ear: Hearing, tympanic membrane, external ear and ear canal normal.  Left Ear: Hearing, tympanic membrane, external ear and ear canal normal.  Nose: Nose normal. No mucosal edema, rhinorrhea, nasal deformity or septal deviation.  Mouth/Throat: Uvula is midline, oropharynx is clear and moist and mucous membranes are normal. No oral lesions. Normal dentition. No dental caries.  Eyes: Conjunctivae and lids are normal. Pupils are equal, round, and reactive to light. No scleral icterus.  Fundoscopic exam:      The right eye shows no arteriolar narrowing, no AV nicking and no papilledema.  The right eye shows red reflex.       The left eye shows no arteriolar narrowing and no papilledema. The left eye shows red reflex.  Periodic vision eval performed by [facility] professional.  Neck: Normal range of motion and full passive range of motion without pain. Neck supple. No hepatojugular reflux and no JVD present. No spinous process tenderness and no muscular tenderness present. Normal range of motion present. No mass and no thyromegaly present.  Cardiovascular: Normal rate, regular rhythm, S1 normal, S2 normal, normal heart sounds and normal pulses.   No extrasystoles are present. PMI is not displaced.  Exam reveals no gallop and no friction rub.   No murmur heard. Pulmonary/Chest: Effort normal and breath sounds normal. No respiratory distress.  Abdominal: Soft. Normal appearance, normal aorta and bowel sounds are normal. He exhibits no distension, no abdominal bruit, no pulsatile midline mass and no mass. There is no hepatosplenomegaly. There is tenderness in the right upper quadrant. There is no rigidity, no rebound, no guarding, no CVA tenderness and negative Murphy's sign. No hernia.  Genitourinary: Rectum normal. Rectal exam shows no external hemorrhoid, no internal hemorrhoid, no fissure, no mass, no tenderness and anal tone normal. Guaiac negative stool. Prostate is enlarged. Prostate is not tender.  Musculoskeletal: Normal range of motion. He exhibits no edema and no tenderness.  Lymphadenopathy:       Head (right  side): No submental, no submandibular, no tonsillar, no posterior auricular and no occipital adenopathy present.       Head (left side): No submental, no submandibular, no tonsillar, no posterior auricular and no occipital adenopathy present.    He has no cervical adenopathy.    He has no axillary adenopathy.       Right: No inguinal and no supraclavicular adenopathy present.       Left: No inguinal and no supraclavicular adenopathy present.  Neurological: He is alert and  oriented to person, place, and time. He has normal reflexes. He is not disoriented. He displays no atrophy. No cranial nerve deficit or sensory deficit. He exhibits normal muscle tone. Coordination and gait normal.  Skin: Skin is warm, dry and intact. No bruising, no lesion and no rash noted. He is not diaphoretic. No cyanosis or erythema. No pallor. Nails show no clubbing.  Psychiatric: He has a normal mood and affect. His speech is normal and behavior is normal. Judgment and thought content normal. Cognition and memory are normal.    Results for orders placed in visit on 04/04/13  POCT URINALYSIS DIPSTICK      Result Value Range   Color, UA yellow     Clarity, UA clear     Glucose, UA neg     Bilirubin, UA neg     Ketones, UA neg     Spec Grav, UA 1.025     Blood, UA neg     pH, UA 6.0     Protein, UA trace     Urobilinogen, UA 0.2     Nitrite, UA neg     Leukocytes, UA Negative    IFOBT (OCCULT BLOOD)      Result Value Range   IFOBT Negative         Assessment & Plan:  Routine general medical examination at a health care facility - Plan: POCT urinalysis dipstick, IFOBT POC (occult bld, rslt in office)  Dyslipidemia - Continue 3 grams of Fish Oil daily. Continue regular exercise and add weight training to increase muscle mass and caloric burn. Pt declines referral to a nutritionist.  Plan: Comprehensive metabolic panel, Lipid panel  Loose stools - Plan: H. pylori antibody, IgG, T4, Free, T3, Free  Enlarged prostate without lower urinary tract symptoms (luts) - Plan: PSA

## 2013-04-04 NOTE — Patient Instructions (Addendum)
Keeping you healthy  Get these tests  Blood pressure- Have your blood pressure checked once a year by your healthcare provider.  Normal blood pressure is 120/80  Weight- Have your body mass index (BMI) calculated to screen for obesity.  BMI is a measure of body fat based on height and weight. You can also calculate your own BMI at [email or web address].  Cholesterol- Have your cholesterol checked every year.  Diabetes- Have your blood sugar checked regularly if you have high blood pressure, high cholesterol, have a family history of diabetes or if you are overweight.  Screening for Colon Cancer- Colonoscopy starting at age 78.  Screening may begin sooner depending on your family history and other health conditions. Follow up colonoscopy as directed by your Gastroenterologist.  Screening for Prostate Cancer- Both blood work (PSA) and a rectal exam help screen for Prostate Cancer.  Screening begins at age 53 with African-American men and at age 60 with Caucasian men.  Screening may begin sooner depending on your family history.  Take these medicines  Aspirin- One aspirin daily can help prevent Heart disease and Stroke.  Flu shot- Every fall.  Tetanus- Every 10 years. Tdap was given in 2007; next Tetanus is due in 2017.  Zostavax- Once after the age of 35 to prevent Shingles.  Pneumonia shot- Once after the age of 11; if you are younger than 23, ask your healthcare provider if you need a Pneumonia shot.  Take these steps  Don't smoke- If you do smoke, talk to your doctor about quitting.  For tips on how to quit, go to www.smokefree.gov or call 1-800-QUIT-NOW.  Be physically active- Exercise 5 days a week for at least 30 minutes.  If you are not already physically active start slow and gradually work up to 30 minutes of moderate physical activity.  Examples of moderate activity include walking briskly, mowing the yard, dancing, swimming, bicycling, etc.  Eat a healthy diet- Eat a  variety of healthy food such as fruits, vegetables, low fat milk, low fat cheese, yogurt, lean meant, poultry, fish, beans, tofu, etc. For more information go to www.thenutritionsource.org  Drink alcohol in moderation- Limit alcohol intake to less than two drinks a day. Never drink and drive.  Dentist- Brush and floss twice daily; visit your dentist twice a year.  Depression- Your emotional health is as important as your physical health. If you're feeling down, or losing interest in things you would normally enjoy please talk to your healthcare provider.  Eye exam- Visit your eye doctor every year.  Safe sex- If you may be exposed to a sexually transmitted infection, use a condom.  Seat belts- Seat belts can save your life; always wear one.  Smoke/Carbon Monoxide detectors- These detectors need to be installed on the appropriate level of your home.  Replace batteries at least once a year.  Skin cancer- When out in the sun, cover up and use sunscreen 15 SPF or higher.  Violence- If anyone is threatening you, please tell your healthcare provider.  Living Will/ Health care power of attorney- Speak with your healthcare provider and family.   Exercise to Lose Weight Exercise and a healthy diet may help you lose weight. Your doctor may suggest specific exercises. EXERCISE IDEAS AND TIPS  Choose low-cost things you enjoy doing, such as walking, bicycling, or exercising to workout videos.  Take stairs instead of the elevator.  Walk during your lunch break.  Park your car further away from work or school.  Go to  a gym or an exercise class.  Start with 5 to 10 minutes of exercise each day. Build up to 30 minutes of exercise 4 to 6 days a week.  Wear shoes with good support and comfortable clothes.  Stretch before and after working out.  Work out until you breathe harder and your heart beats faster.  Drink extra water when you exercise.  Do not do so much that you hurt yourself,  feel dizzy, or get very short of breath. Exercises that burn about 150 calories:  Running 1  miles in 15 minutes.  Playing volleyball for 45 to 60 minutes.  Washing and waxing a car for 45 to 60 minutes.  Playing touch football for 45 minutes.  Walking 1  miles in 35 minutes.  Pushing a stroller 1  miles in 30 minutes.  Playing basketball for 30 minutes.  Raking leaves for 30 minutes.  Bicycling 5 miles in 30 minutes.  Walking 2 miles in 30 minutes.  Dancing for 30 minutes.  Shoveling snow for 15 minutes.  Swimming laps for 20 minutes.  Walking up stairs for 15 minutes.  Bicycling 4 miles in 15 minutes.  Gardening for 30 to 45 minutes.  Jumping rope for 15 minutes.  Washing windows or floors for 45 to 60 minutes. Document Released: 08/21/2010 Document Revised: 10/11/2011 Document Reviewed: 08/21/2010 [facility] Patient Information 2014 [place], [place].  I have suggested you step up your strength training in order to increase your muscle mass and increase the amount of calories you burn per workout. A reasonable weight loss goal is 1-2 pound to 1 pound per week. You have lost 10 pounds since May 2013.    Helicobacter Pylori and Ulcer Disease An ulcer may be in your stomach (gastric ulcer) or in the first part of your small bowel, which is called the duodenum (duodenal ulcer). An ulcer is a break in the stomach or duodenum lining. The break wears down into the deeper tissue. Helicobacter pylori (H. pylori) is a type of germ (bacteria) that may cause the majority of gastric or duodenal ulcers. CAUSES   A germ (bacterium). H. pylori can weaken the protective mucous coating of the stomach and duodenum. This allows acid to get through to the sensitive lining of the stomach or duodenum and an ulcer can then form.  Certain medications.  Using substances that can bother the lining of the stomach (alcohol, tobacco or medications such as Advil or Motrin) in the presence of  H.pylori infection. This can increase the chances of getting an ulcer.  Cancer (rarely). Most people infected with H. pylori do not get ulcers. It is not known how people catch H. pylori. It may be through food or water. H. pylori has been found in the saliva of some infected people. Therefore, the bacteria may also spread through mouth-to-mouth contact such as kissing. SYMPTOMS  The problems (symptoms) of ulcer disease are usually:  A burning or gnawing of the mid-upper belly (abdomen). This is often worse on an empty stomach. It may get better with food. This may be associated with feeling sick to your stomach (nausea), bloating and vomiting.  If the ulcer results in bleeding, it can cause:  Black, tarry stools.  Throwing up bright red blood.  Throwing up coffee ground looking materials. With severe bleeding, there may be loss of consciousness and shock. Besides ulcer disease, H. pylori can also cause chronic gastritis (irritation of the lining of the stomach without ulcer) or stomach acid-type discomfort. You may  not have symptoms even though you have an H. pylori infection. Although this is an infection, you may not have usual infection symptoms (such as fever). DIAGNOSIS  Ulcer disease can be diagnosed in many different ways. If you have an ulcer, it is important to know whether or not it is caused by H. Pylori. Treatment for an ulcer caused by H. pylori is different from that for an ulcer with other causes. The best way to detect H. pylori is taking tissue directly from the ulcer during an endoscopy test.   An endoscopy is an exam that uses an endoscope. This is a thin, lighted tube with a small camera on the end. It is like a flexible telescope. The patient is given a drug to make them calm (sedative). The caregiver eases the endoscope into the mouth and down the throat to the stomach and duodenum. This allows the doctor to see the lining of the esophagus, stomach and duodenum.  If an  endoscopy is not needed, then H. pylori can be detected with tests of the blood, stool or even breath. TREATMENT   H. pylori peptic ulcer treatment usually involves a combination of:  Medicines that kill germs (antibiotics).  Acid suppressors.  Stomach protectors.  The use of only one medication to treat H. pylori is not recommended. The most proven treatment is a 2 week course of treatment called triple therapy. It involves taking two antibiotics to kill the bacteria and either an acid suppressor or stomach-lining shield. Two-week triple therapy reduces ulcer symptoms, kills the bacteria, and prevents ulcers from coming back in many patients.  Unfortunately, patients may find triple therapy hard to do. This is because it involves taking as many as 20 pills a day. Also, the antibiotics used in triple therapy may cause mild side effects. These include nausea, vomiting, diarrhea, dark stools, a metallic taste in the mouth, dizziness, headache and yeast infections in women. Talk to your caregiver if you have any of these side effects. HOME CARE INSTRUCTIONS   Take your medications as directed and for as long as prescribed. Contact your caregiver if you have problems or side effects from your medications.  Continue regular work and usual activities unless told otherwise by your caregiver.  Avoid tobacco, alcohol and caffeine. Tobacco use will decrease and slow healing.  Avoid medications that are harmful. This includes aspirin and NSAIDS such as ibuprofen and naproxen.  Avoid foods that seem to aggravate or cause discomfort.  There are many over-the-counter products available to control stomach acid and other symptoms. Discuss these with your caregiver before using them. Do not  stop taking prescription medications for over-the-counter medications without talking with your caregiver.  Special diets are not usually needed.  Keep any follow-up appointments and blood tests as directed. SEEK  MEDICAL CARE IF:   Your pain or other ulcer symptoms do not improve within a few days of starting treatment.  You develop diarrhea. This can be a problem related to certain treatments.  You have ongoing indigestion or heartburn even if your main ulcer symptoms are improved.  You think you have any side effects from your medications or if you do not understand how to use your medications right. SEEK IMMEDIATE MEDICAL CARE IF:  Any of the following happen:  You develop bright red, rectal bleeding.  You develop dark black, tarry stools.  You throw up (vomit) blood.  You become light-headed, weak, have fainting episodes, or become sweaty, cold and clammy.  You have severe  abdominal pain not controlled by medications. Do not take pain medications unless ordered by your caregiver. MAKE SURE YOU:   Understand these instructions.  Will watch your condition.  Will get help right away if you are not doing well or get worse. Document Released: 10/09/2003 Document Revised: 10/11/2011 Document Reviewed: 03/07/2008 [facility] Patient Information 2013 [place], [place].

## 2013-04-05 LAB — H. PYLORI ANTIBODY, IGG: H Pylori IgG: 1.22 {ISR} — ABNORMAL HIGH

## 2013-04-09 ENCOUNTER — Other Ambulatory Visit: Payer: Self-pay | Admitting: Family Medicine

## 2013-04-09 DIAGNOSIS — N400 Enlarged prostate without lower urinary tract symptoms: Secondary | ICD-10-CM

## 2013-04-09 DIAGNOSIS — R972 Elevated prostate specific antigen [PSA]: Secondary | ICD-10-CM

## 2013-04-09 MED ORDER — CLARITHROMYCIN 500 MG PO TABS: ORAL_TABLET | ORAL | Status: DC

## 2013-04-09 MED ORDER — OMEPRAZOLE 20 MG PO CPDR: DELAYED_RELEASE_CAPSULE | ORAL | Status: DC

## 2013-04-09 NOTE — Progress Notes (Signed)
Quick Note:  Please contact pt and advise that the following labs are abnormal...  Blood test for bacteria in the stomach is positive; I am prescribing 2 medications; take the Omeprazole as directed for 8 weeks total and the antibiotic for 2 weeks only.  Prostate blood test (PSA) is slightly above normal. I am referring you to a Urologist for further evaluation.  All other tests are normal.  Copy to pt. ______

## 2013-05-11 ENCOUNTER — Ambulatory Visit (INDEPENDENT_AMBULATORY_CARE_PROVIDER_SITE_OTHER): Payer: BC Managed Care – PPO | Admitting: Internal Medicine

## 2013-05-11 VITALS — BP 144/74 | HR 65 | Temp 98.4°F | Resp 18 | Wt 218.0 lb

## 2013-05-11 DIAGNOSIS — R21 Rash and other nonspecific skin eruption: Secondary | ICD-10-CM

## 2013-05-11 DIAGNOSIS — R194 Change in bowel habit: Secondary | ICD-10-CM

## 2013-05-11 DIAGNOSIS — R141 Gas pain: Secondary | ICD-10-CM

## 2013-05-11 DIAGNOSIS — R198 Other specified symptoms and signs involving the digestive system and abdomen: Secondary | ICD-10-CM

## 2013-05-11 DIAGNOSIS — R42 Dizziness and giddiness: Secondary | ICD-10-CM

## 2013-05-11 DIAGNOSIS — B360 Pityriasis versicolor: Secondary | ICD-10-CM

## 2013-05-11 DIAGNOSIS — R140 Abdominal distension (gaseous): Secondary | ICD-10-CM

## 2013-05-11 LAB — POCT CBC
HCT, POC: 42.8 % — AB (ref 43.5–53.7)
Lymph, poc: 2.1 (ref 0.6–3.4)
MCHC: 32.2 g/dL (ref 31.8–35.4)
MCV: 95.7 fL (ref 80–97)
MID (cbc): 0.4 (ref 0–0.9)
POC Granulocyte: 4.8 (ref 2–6.9)
POC LYMPH PERCENT: 29.4 %L (ref 10–50)
Platelet Count, POC: 172 10*3/uL (ref 142–424)
RDW, POC: 13.7 %

## 2013-05-11 LAB — GLUCOSE, POCT (MANUAL RESULT ENTRY): POC Glucose: 71 mg/dl (ref 70–99)

## 2013-05-11 LAB — LIPASE: Lipase: 23 U/L (ref 0–75)

## 2013-05-11 MED ORDER — TRIAMCINOLONE ACETONIDE 0.1 % EX CREA: TOPICAL_CREAM | Freq: Two times a day (BID) | CUTANEOUS | Status: DC

## 2013-05-11 MED ORDER — FLUCONAZOLE 200 MG PO TABS: 200.0000 mg | ORAL_TABLET | Freq: Every day | ORAL | Status: DC

## 2013-05-11 NOTE — Progress Notes (Signed)
Subjective:    Patient ID: George Garner, male    DOB: 12/30/1955, 57 y.o.   MRN: [id number]  HPI presents today with ongoing issue upset stomach, multiple BMs, sometimes diarrhea sometimes normal. These symptoms have not improved since 04/04/13. He was treated with antibiotic at that time due to bacteria in stomach. Up until August was always had regular am BM. Had colonoscopy 5 years ago. No nausea, vomiting, anorexia. Is losing weight but trying to lose.Occ abdominal pain , does have H. Pylori, txed but feels no better and is on omeprazole now.  2) rash L side. Itchy and is also on R side as well. Just on front of chestt, is fading.  3)dizziness x 2 weeks. More when he stands up from sitting position. Is postural only. No recent cbc found in record, hemosure was negative.    Review of Systems     Objective:   Physical Exam  Vitals reviewed. Constitutional: He is oriented to person, place, and time. He appears well-developed and well-nourished. No distress.  HENT:  Head: Normocephalic.  Eyes: EOM are normal. No scleral icterus.  Neck: Neck supple.  Cardiovascular: Normal rate, regular rhythm, normal heart sounds and intact distal pulses.   Pulmonary/Chest: Effort normal and breath sounds normal.  Abdominal: Soft. Bowel sounds are normal. He exhibits no distension and no mass. There is no tenderness. There is no rebound and no guarding.  Neurological: He is alert and oriented to person, place, and time. He exhibits normal muscle tone. Coordination normal.  Skin: Rash noted. Rash is maculopapular. There is erythema.     Itchy, irregular paps and macs, scaly  Back covered with tinea versicolor  Psychiatric: He has a normal mood and affect. His behavior is normal.   EKG is normal except low voltage  Results for orders placed in visit on 05/11/13  POCT CBC      Result Value Range   WBC 7.3  4.6 - 10.2 K/uL   Lymph, poc 2.1  0.6 - 3.4   POC LYMPH PERCENT 29.4  10 - 50 %L   MID (cbc)  0.4  0 - 0.9   POC MID % 5.5  0 - 12 %M   POC Granulocyte 4.8  2 - 6.9   Granulocyte percent 65.1  37 - 80 %G   RBC 4.47 (*) 4.69 - 6.13 M/uL   Hemoglobin 13.8 (*) 14.1 - 18.1 g/dL   HCT, POC 40.9 (*) 81.1 - 53.7 %   MCV 95.7  80 - 97 fL   MCH, POC 30.9  27 - 31.2 pg   MCHC 32.2  31.8 - 35.4 g/dL   RDW, POC 91.4     Platelet Count, POC 172  142 - 424 K/uL   MPV 8.3  0 - 99.8 fL  GLUCOSE, POCT (MANUAL RESULT ENTRY)      Result Value Range   POC Glucose 71  70 - 99 mg/dl  POCT GLYCOSYLATED HEMOGLOBIN (HGB A1C)      Result Value Range   Hemoglobin A1C 4.9          Assessment & Plan:  Fluconazole 200mg  1 po qd  Week/Triamcinolone cream chest 1 week Refer to GI [place] for GI issues and mild anemia Patient advised to return in one month for follow up sooner if worse  Meds ordered this encounter  Medications  . fluconazole (DIFLUCAN) 200 MG tablet    Sig: Take 1 tablet (200 mg total) by mouth daily.  Dispense:  7 tablet    Refill:  0  . triamcinolone cream (KENALOG) 0.1 %    Sig: Apply topically 2 (two) times daily.    Dispense:  30 g    Refill:  0

## 2013-05-11 NOTE — Progress Notes (Signed)
  Subjective:    Patient ID: George Garner, male    DOB: 20-Jul-1956, 57 y.o.   MRN: [id number]  HPI    Review of Systems     Objective:   Physical Exam        Assessment & Plan:

## 2013-05-11 NOTE — Patient Instructions (Addendum)
You will be referred to GI doctor for the abdominal pain and anemia. You will get a call regarding this. If you get worse or dizziness becomes more persistent let us know. To emergency room if severe. Use the cream on your rash, and take the diflucan daily for seven days, this should help the rash very quickly. Return to clinic in one month for a recheck  Vertigo Vertigo means you feel like you or your surroundings are moving when they are not. Vertigo can be dangerous if it occurs when you are at work, driving, or performing difficult activities.  CAUSES  Vertigo occurs when there is a conflict of signals sent to your brain from the visual and sensory systems in your body. There are many different causes of vertigo, including: Infections, especially in the inner ear. A bad reaction to a drug or misuse of alcohol and medicines. Withdrawal from drugs or alcohol. Rapidly changing positions, such as lying down or rolling over in bed. A migraine headache. Decreased blood flow to the brain. Increased pressure in the brain from a head injury, infection, tumor, or bleeding. SYMPTOMS  You may feel as though the world is spinning around or you are falling to the ground. Because your balance is upset, vertigo can cause nausea and vomiting. You may have involuntary eye movements (nystagmus). DIAGNOSIS  Vertigo is usually diagnosed by physical exam. If the cause of your vertigo is unknown, your caregiver may perform imaging tests, such as an MRI scan (magnetic resonance imaging). TREATMENT  Most cases of vertigo resolve on their own, without treatment. Depending on the cause, your caregiver may prescribe certain medicines. If your vertigo is related to body position issues, your caregiver may recommend movements or procedures to correct the problem. In rare cases, if your vertigo is caused by certain inner ear problems, you may need surgery. HOME CARE INSTRUCTIONS  Follow your caregiver's  instructions. Avoid driving. Avoid operating heavy machinery. Avoid performing any tasks that would be dangerous to you or others during a vertigo episode. Tell your caregiver if you notice that certain medicines seem to be causing your vertigo. Some of the medicines used to treat vertigo episodes can actually make them worse in some people. SEEK IMMEDIATE MEDICAL CARE IF:  Your medicines do not relieve your vertigo or are making it worse. You develop problems with talking, walking, weakness, or using your arms, hands, or legs. You develop severe headaches. Your nausea or vomiting continues or gets worse. You develop visual changes. A family member notices behavioral changes. Your condition gets worse. MAKE SURE YOU: Understand these instructions. Will watch your condition. Will get help right away if you are not doing well or get worse. Document Released: 04/28/2005 Document Revised: 10/11/2011 Document Reviewed: 02/04/2011 [facility] Patient Information 2014 [place], [place].    Tinea Versicolor Tinea versicolor is a common yeast infection of the skin. This condition becomes known when the yeast on our skin starts to overgrow (yeast is a normal inhabitant on our skin). This condition is noticed as white or light brown patches on brown skin, and is more evident in the summer on tanned skin. These areas are slightly scaly if scratched. The light patches from the yeast become evident when the yeast creates "holes in your suntan". This is most often noticed in the summer. The patches are usually located on the chest, back, pubis, neck and body folds. However, it may occur on any area of body. Mild itching and inflammation (redness or soreness) may be  present. DIAGNOSIS  The diagnosisof this is made clinically (by looking). Cultures from samples are usually not needed. Examination under the microscope may help. However, yeast is normally found on skin. The diagnosis still remains clinical.  Examination under Wood's Ultraviolet Light can determine the extent of the infection. TREATMENT  This common infection is usually only of cosmetic (only a concern to your appearance). It is easily treated with dandruff shampoo used during showers or bathing. Vigorous scrubbing will eliminate the yeast over several days time. The light areas in your skin may remain for weeks or months after the infection is cured unless your skin is exposed to sunlight. The lighter or darker spots caused by the fungus that remain after complete treatment are not a sign of treatment failure; it will take a long time to resolve. Your caregiver may recommend a number of commercial preparations or medication by mouth if home care is not working. Recurrence is common and preventative medication may be necessary. This skin condition is not highly contagious. Special care is not needed to protect close friends and family members. Normal hygiene is usually enough. Follow up is required only if you develop complications (such as a secondary infection from scratching), if recommended by your caregiver, or if no relief is obtained from the preparations used. Document Released: 07/16/2000 Document Revised: 10/11/2011 Document Reviewed: 08/28/2008 [facility] Patient Information 2014 [place], [place]. Gastroesophageal Reflux Disease, Adult Gastroesophageal reflux disease (GERD) happens when acid from your stomach flows up into the esophagus. When acid comes in contact with the esophagus, the acid causes soreness (inflammation) in the esophagus. Over time, GERD may create small holes (ulcers) in the lining of the esophagus. CAUSES   Increased body weight. This puts pressure on the stomach, making acid rise from the stomach into the esophagus.  Smoking. This increases acid production in the stomach.  Drinking alcohol. This causes decreased pressure in the lower esophageal sphincter (valve or ring of muscle between the esophagus and  stomach), allowing acid from the stomach into the esophagus.  Late evening meals and a full stomach. This increases pressure and acid production in the stomach.  A malformed lower esophageal sphincter. Sometimes, no cause is found. SYMPTOMS   Burning pain in the lower part of the mid-chest behind the breastbone and in the mid-stomach area. This may occur twice a week or more often.  Trouble swallowing.  Sore throat.  Dry cough.  Asthma-like symptoms including chest tightness, shortness of breath, or wheezing. DIAGNOSIS  Your caregiver may be able to diagnose GERD based on your symptoms. In some cases, X-rays and other tests may be done to check for complications or to check the condition of your stomach and esophagus. TREATMENT  Your caregiver may recommend over-the-counter or prescription medicines to help decrease acid production. Ask your caregiver before starting or adding any new medicines.  HOME CARE INSTRUCTIONS   Change the factors that you can control. Ask your caregiver for guidance concerning weight loss, quitting smoking, and alcohol consumption.  Avoid foods and drinks that make your symptoms worse, such as:  Caffeine or alcoholic drinks.  Chocolate.  Peppermint or mint flavorings.  Garlic and onions.  Spicy foods.  Citrus fruits, such as oranges, lemons, or limes.  Tomato-based foods such as sauce, chili, salsa, and pizza.  Fried and fatty foods.  Avoid lying down for the 3 hours prior to your bedtime or prior to taking a nap.  Eat small, frequent meals instead of large meals.  Wear loose-fitting clothing.  Do not wear anything tight around your waist that causes pressure on your stomach.  Raise the head of your bed 6 to 8 inches with wood blocks to help you sleep. Extra pillows will not help.  Only take over-the-counter or prescription medicines for pain, discomfort, or fever as directed by your caregiver.  Do not take aspirin, ibuprofen, or other  nonsteroidal anti-inflammatory drugs (NSAIDs). SEEK IMMEDIATE MEDICAL CARE IF:   You have pain in your arms, neck, jaw, teeth, or back.  Your pain increases or changes in intensity or duration.  You develop nausea, vomiting, or sweating (diaphoresis).  You develop shortness of breath, or you faint.  Your vomit is green, yellow, black, or looks like coffee grounds or blood.  Your stool is red, bloody, or black. These symptoms could be signs of other problems, such as heart disease, gastric bleeding, or esophageal bleeding. MAKE SURE YOU:   Understand these instructions.  Will watch your condition.  Will get help right away if you are not doing well or get worse. Document Released: 04/28/2005 Document Revised: 10/11/2011 Document Reviewed: 02/05/2011 [facility] Patient Information 2014 [employer] [place], [place]. Bloating Bloating is the feeling of fullness in your belly. You may feel as though your pants are too tight. Often the cause of bloating is overeating, retaining fluids, or having gas in your bowel. It is also caused by swallowing air and eating foods that cause gas. Irritable bowel syndrome is one of the most common causes of bloating. Constipation is also a common cause. Sometimes more serious problems can cause bloating. SYMPTOMS  Usually there is a feeling of fullness, as though your abdomen is bulged out. There may be mild discomfort.  DIAGNOSIS  Usually no particular testing is necessary for most bloating. If the condition persists and seems to become worse, your caregiver may do additional testing.  TREATMENT   There is no direct treatment for bloating.  Do not put gas into the bowel. Avoid chewing gum and sucking on candy. These tend to make you swallow air. Swallowing air can also be a nervous habit. Try to avoid this.  Avoiding high residue diets will help. Eat foods with soluble fibers (examples include root vegetables, apples, or barley) and substitute dairy products with  soy and rice products. This helps irritable bowel syndrome.  If constipation is the cause, then a high residue diet with more fiber will help.  Avoid carbonated beverages.  Over-the-counter preparations are available that help reduce gas. Your pharmacist can help you with this. SEEK MEDICAL CARE IF:   Bloating continues and seems to be getting worse.  You notice a weight gain.  You have a weight loss but the bloating is getting worse.  You have changes in your bowel habits or develop nausea or vomiting. SEEK IMMEDIATE MEDICAL CARE IF:   You develop shortness of breath or swelling in your legs.  You have an increase in abdominal pain or develop chest pain. Document Released: 05/19/2006 Document Revised: 10/11/2011 Document Reviewed: 07/07/2007 [facility] Patient Information 2014 [place], [place].

## 2013-05-16 ENCOUNTER — Telehealth: Payer: Self-pay | Admitting: Radiology

## 2013-05-16 NOTE — Telephone Encounter (Signed)
E-mail sent for Dr. Perrin Maltese:  Dr. Perrin Maltese had suggested a colonoscopy for me.  I'm not adverse to doing that if it's necessary, but it's a very expensive procedure, even with insurance, and I want to avoid it if at all possible.  My question for him is simply this; since my original problem was with a bacteria in my stomach, shouldn't we retest for that to make certain the antibiotic worked before doing the colonoscopy?  I have not yet been contacted by the [facility], but I wanted to run this by him before they do call.    Dr. Lars Masson advise what you want to suggest for the patient.  Thanks!

## 2013-05-20 NOTE — Telephone Encounter (Signed)
Must have colonoscopy

## 2013-05-21 NOTE — Telephone Encounter (Signed)
Called patient to advise. He indicates he will come in to talk to Dr Perrin Maltese about it when he is back in town.

## 2013-07-04 ENCOUNTER — Encounter: Payer: Self-pay | Admitting: Internal Medicine

## 2014-01-07 ENCOUNTER — Ambulatory Visit: Payer: BC Managed Care – PPO

## 2014-01-07 ENCOUNTER — Ambulatory Visit (INDEPENDENT_AMBULATORY_CARE_PROVIDER_SITE_OTHER): Payer: BC Managed Care – PPO | Admitting: Emergency Medicine

## 2014-01-07 VITALS — BP 130/72 | HR 72 | Temp 98.3°F | Ht 72.0 in | Wt 241.0 lb

## 2014-01-07 DIAGNOSIS — M25562 Pain in left knee: Secondary | ICD-10-CM

## 2014-01-07 DIAGNOSIS — S83509A Sprain of unspecified cruciate ligament of unspecified knee, initial encounter: Secondary | ICD-10-CM

## 2014-01-07 DIAGNOSIS — M25569 Pain in unspecified knee: Secondary | ICD-10-CM

## 2014-01-07 MED ORDER — MELOXICAM 7.5 MG PO TABS: ORAL_TABLET | ORAL | Status: DC

## 2014-01-07 NOTE — Progress Notes (Signed)
   Subjective:    Patient ID: George Garner, male    DOB: Jun 19, 1956, 58 y.o.   MRN: [id number]  HPI 58 year old male pt presents with left knee pain. Twisted his knee on Saturday. Sunday morning he had a lot of pain in his knee. The pain has become worse. There is some swelling. He cannot fully straighten his leg.     Review of Systems     Objective:   Physical Exam There is tenderness at the proximal attachment of the lateral collateral ligament. There is also tenderness present along the medial meniscus. There is a negative anterior drawer sign. There is no knee weakness noted.  [facility] reading (PRIMARY) by  Dr.Ignatius Kloos negative      Assessment & Plan:  Patient has a lateral collateral ligament strain and may have an injury to the medial cartilage. He will treat this with rest ice elevation and given an anti-inflammatory medication

## 2014-01-07 NOTE — Patient Instructions (Signed)
Knee Sprain  A knee sprain is a tear in one of the strong, fibrous tissues that connect the bones (ligaments) in your knee. The severity of the sprain depends on how much of the ligament is torn. The tear can be either partial or complete.  CAUSES   Often, sprains are a result of a fall or injury. The force of the impact causes the fibers of your ligament to stretch too much. This excess tension causes the fibers of your ligament to tear.  SIGNS AND SYMPTOMS   You may have some loss of motion in your knee. Other symptoms include:   Bruising.   Pain in the knee area.   Tenderness of the knee to the touch.   Swelling.  DIAGNOSIS   To diagnose a knee sprain, your health care provider will physically examine your knee. Your health care provider may also suggest an X-ray exam of your knee to make sure no bones are broken.  TREATMENT   If your ligament is only partially torn, treatment usually involves keeping the knee in a fixed position (immobilization) or bracing your knee for activities that require movement for several weeks. To do this, your health care provider will apply a bandage, cast, or splint to keep your knee from moving and to support your knee during movement until it heals. For a partially torn ligament, the healing process usually takes 4 6 weeks.  If your ligament is completely torn, depending on which ligament it is, you may need surgery to reconnect the ligament to the bone or reconstruct it. After surgery, a cast or splint may be applied and will need to stay on your knee for 4 6 weeks while your ligament heals.  HOME CARE INSTRUCTIONS   Keep your injured knee elevated to decrease swelling.   To ease pain and swelling, apply ice to the injured area:   Put ice in a plastic bag.   Place a towel between your skin and the bag.   Leave the ice on for 20 minutes, 2 3 times a day.   Only take medicine for pain as directed by your health care provider.   Do not leave your knee unprotected until  pain and stiffness go away (usually 4 6 weeks).   If you have a cast or splint, do not allow it to get wet. If you have been instructed not to remove it, cover it with a plastic bag when you shower or bathe. Do not swim.   Your health care provider may suggest exercises for you to do during your recovery to prevent or limit permanent weakness and stiffness.  SEEK IMMEDIATE MEDICAL CARE IF:   Your cast or splint becomes damaged.   Your pain becomes worse.   You have significant pain, swelling, or numbness below the cast or splint.  MAKE SURE YOU:   Understand these instructions.   Will watch your condition.   Will get help right away if you are not doing well or get worse.  Document Released: 07/19/2005 Document Revised: 05/09/2013 Document Reviewed: 02/28/2013  [employer] Patient Information 2014 [employer].

## 2014-04-25 ENCOUNTER — Ambulatory Visit (INDEPENDENT_AMBULATORY_CARE_PROVIDER_SITE_OTHER): Payer: BC Managed Care – PPO | Admitting: Family Medicine

## 2014-04-25 VITALS — BP 142/79 | HR 69 | Temp 97.8°F | Resp 16 | Ht 72.0 in | Wt 237.0 lb

## 2014-04-25 DIAGNOSIS — S46911S Strain of unspecified muscle, fascia and tendon at shoulder and upper arm level, right arm, sequela: Secondary | ICD-10-CM

## 2014-04-25 DIAGNOSIS — S8392XS Sprain of unspecified site of left knee, sequela: Secondary | ICD-10-CM

## 2014-04-25 DIAGNOSIS — S63601S Unspecified sprain of right thumb, sequela: Secondary | ICD-10-CM

## 2014-04-25 DIAGNOSIS — IMO0002 Reserved for concepts with insufficient information to code with codable children: Secondary | ICD-10-CM

## 2014-04-25 MED ORDER — MELOXICAM 15 MG PO TABS: 15.0000 mg | ORAL_TABLET | Freq: Every day | ORAL | Status: DC

## 2014-04-25 NOTE — Patient Instructions (Signed)
Arthritis, Nonspecific °Arthritis is inflammation of a joint. This usually means pain, redness, warmth or swelling are present. One or more joints may be involved. There are a number of types of arthritis. Your caregiver may not be able to tell what type of arthritis you have right away. °CAUSES  °The most common cause of arthritis is the wear and tear on the joint (osteoarthritis). This causes damage to the cartilage, which can break down over time. The knees, hips, back and neck are most often affected by this type of arthritis. °Other types of arthritis and common causes of joint pain include: °· Sprains and other injuries near the joint. Sometimes minor sprains and injuries cause pain and swelling that develop hours later. °· Rheumatoid arthritis. This affects hands, feet and knees. It usually affects both sides of your body at the same time. It is often associated with chronic ailments, fever, weight loss and general weakness. °· Crystal arthritis. Gout and pseudo gout can cause occasional acute severe pain, redness and swelling in the foot, ankle, or knee. °· Infectious arthritis. Bacteria can get into a joint through a break in overlying skin. This can cause infection of the joint. Bacteria and viruses can also spread through the blood and affect your joints. °· Drug, infectious and allergy reactions. Sometimes joints can become mildly painful and slightly swollen with these types of illnesses. °SYMPTOMS  °· Pain is the main symptom. °· Your joint or joints can also be red, swollen and warm or hot to the touch. °· You may have a fever with certain types of arthritis, or even feel overall ill. °· The joint with arthritis will hurt with movement. Stiffness is present with some types of arthritis. °DIAGNOSIS  °Your caregiver will suspect arthritis based on your description of your symptoms and on your exam. Testing may be needed to find the type of arthritis: °· Blood and sometimes urine tests. °· X-ray tests  and sometimes CT or MRI scans. °· Removal of fluid from the joint (arthrocentesis) is done to check for bacteria, crystals or other causes. Your caregiver (or a specialist) will numb the area over the joint with a local anesthetic, and use a needle to remove joint fluid for examination. This procedure is only minimally uncomfortable. °· Even with these tests, your caregiver may not be able to tell what kind of arthritis you have. Consultation with a specialist (rheumatologist) may be helpful. °TREATMENT  °Your caregiver will discuss with you treatment specific to your type of arthritis. If the specific type cannot be determined, then the following general recommendations may apply. °Treatment of severe joint pain includes: °· Rest. °· Elevation. °· Anti-inflammatory medication (for example, ibuprofen) may be prescribed. Avoiding activities that cause increased pain. °· Only take over-the-counter or prescription medicines for pain and discomfort as recommended by your caregiver. °· Cold packs over an inflamed joint may be used for 10 to 15 minutes every hour. Hot packs sometimes feel better, but do not use overnight. Do not use hot packs if you are diabetic without your caregiver's permission. °· A cortisone shot into arthritic joints may help reduce pain and swelling. °· Any acute arthritis that gets worse over the next 1 to 2 days needs to be looked at to be sure there is no joint infection. °Long-term arthritis treatment involves modifying activities and lifestyle to reduce joint stress jarring. This can include weight loss. Also, exercise is needed to nourish the joint cartilage and remove waste. This helps keep the muscles   around the joint strong. °HOME CARE INSTRUCTIONS  °· Do not take aspirin to relieve pain if gout is suspected. This elevates uric acid levels. °· Only take over-the-counter or prescription medicines for pain, discomfort or fever as directed by your caregiver. °· Rest the joint as much as  possible. °· If your joint is swollen, keep it elevated. °· Use crutches if the painful joint is in your leg. °· Drinking plenty of fluids may help for certain types of arthritis. °· Follow your caregiver's dietary instructions. °· Try low-impact exercise such as: °¨ Swimming. °¨ Water aerobics. °¨ Biking. °¨ Walking. °· Morning stiffness is often relieved by a warm shower. °· Put your joints through regular range-of-motion. °SEEK MEDICAL CARE IF:  °· You do not feel better in 24 hours or are getting worse. °· You have side effects to medications, or are not getting better with treatment. °SEEK IMMEDIATE MEDICAL CARE IF:  °· You have a fever. °· You develop severe joint pain, swelling or redness. °· Many joints are involved and become painful and swollen. °· There is severe back pain and/or leg weakness. °· You have loss of bowel or bladder control. °Document Released: 08/26/2004 Document Revised: 10/11/2011 Document Reviewed: 09/11/2008 °[employer]® Patient Information ©2015 [employer]. This information is not intended to replace advice given to you by your health care provider. Make sure you discuss any questions you have with your health care provider. ° °

## 2014-04-25 NOTE — Progress Notes (Signed)
Subjective:    Patient ID: George Garner, male    DOB: July 31, 1956, 58 y.o.   MRN: [id number] Chief Complaint  Patient presents with  . Hand Pain    right thumb has been having pain for about 3 weeks; no swelling;  pt states he has had injuries to thumb in the past  . Elbow Pain    hx of injury;  right elbow has been having pain for about 1 month; no swelling; pt states pain is everyday and is not going away  . Knee Pain    hx of injury; left knee has been having pain off and on for about 3 months; no swelling; walking up the stairs triggers more pain than other daily activities    HPI  Had hyperextension injury of his right thumb about 3 weeks ago when playing volleyball.  Never had any swelling or ROM but painful MCP joint and clicks - feels like he might have popped off a part of a bone. Has not tried anything for it - thought it would just heal but instead the pain has been getting worse. Also having pain over medial aspect of elbow when arm is fully extended.  No swelling or weakness, no decreased ROM, no pain with lifting or grasping. 3 mos ago was seen here for suspected Left knee LCL sprain - was rehabed w/ RICE and meloxicam which helped significantly but he is still having pain in the knee when walking upstairs.   No sig medical problems. 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  Past Medical History  Diagnosis Date  . GERD (gastroesophageal reflux disease)   . Dyslipidemia   . Hypogonadism male   . OSA on CPAP   . Obstructive sleep apnea   . Allergy    Current Outpatient Prescriptions on File Prior to Visit  Medication Sig Dispense Refill  . aspirin 81 MG tablet Take 81 mg by mouth daily.      . cetirizine (ZYRTEC) 10 MG tablet Take 10 mg by mouth daily.      . fish oil-omega-3 fatty acids 1000 MG capsule Take 3 g by mouth daily.       . fluticasone (FLONASE) 50 MCG/ACT nasal spray Place 2 sprays into the nose daily.  16 g  12  . ipratropium (ATROVENT) 0.06 %  nasal spray Place 2 sprays into the nose 4 (four) times daily.  15 mL  1  . Multiple Vitamin (MULTIVITAMIN) tablet Take 1 tablet by mouth daily.      . niacin (NIASPAN) 500 MG CR tablet Take daily for lipids  90 tablet  3  . omeprazole (PRILOSEC) 20 MG capsule Take 2 capsules once a day for 2 weeks then 1 capsule daily for 6 weeks.  30 capsule  2   No current facility-administered medications on file prior to visit.   Allergies  Allergen Reactions  . Penicillins Rash     Review of Systems  Constitutional: Negative for fever, chills, diaphoresis, activity change and appetite change.  Gastrointestinal: Negative for vomiting, abdominal pain and abdominal distention.  Musculoskeletal: Positive for arthralgias. Negative for back pain, gait problem, joint swelling and myalgias.  Skin: Negative for color change, rash and wound.  Neurological: Negative for weakness and numbness.  Hematological: Negative for adenopathy. Does not bruise/bleed easily.  Psychiatric/Behavioral: Negative for sleep disturbance.       Objective:  BP 142/79  Pulse 69  Temp(Src) 97.8 F (36.6 C) (Oral)  Resp 16  Ht 6' (1.829 m)  Wt 237 lb (107.502 kg)  BMI 32.14 kg/m2  SpO2 97%  Physical Exam  Constitutional: He is oriented to person, place, and time. He appears well-developed and well-nourished. No distress.  HENT:  Head: Normocephalic and atraumatic.  Eyes: No scleral icterus.  Pulmonary/Chest: Effort normal.  Musculoskeletal:       Right elbow: He exhibits normal range of motion, no swelling, no effusion, no deformity and no laceration. Tenderness found. Medial epicondyle tenderness noted. No radial head, no lateral epicondyle and no olecranon process tenderness noted.       Right wrist: Normal.       Right hand: He exhibits tenderness and bony tenderness. He exhibits normal range of motion, no deformity and no swelling. Normal sensation noted. Normal strength noted.  Rt first MCP joint ttp over palmar  aspect but no tenderness over CMC or metacarpals, no ttp over first extensor tendons and full ROM. No pain in Rt epicondyle with any resisted muscle testing in arm or wrist.  Neurological: He is alert and oriented to person, place, and time. He has normal strength. He displays no atrophy. No sensory deficit. He exhibits normal muscle tone. Gait normal.  Skin: Skin is warm and dry. He is not diaphoretic.  Psychiatric: He has a normal mood and affect. His behavior is normal.       Assessment & Plan:   Strain of elbow, right, sequela - Plan: Ambulatory referral to Orthopedic Surgery  Thumb sprain, right, sequela - Plan: Ambulatory referral to Orthopedic Surgery  Left knee sprain, sequela - Plan: Ambulatory referral to Orthopedic Surgery Since all injuries are several weeks out and pt does not have any deficit in sensation, weakness, swelling, or restrictions of motion but just having pain so start daily meloxicam and refer to ortho as concern more chronic joint problems - arthritis.  Will defer xray imaging to ortho since all problems more chronic. Consider starting glucosamine-chondroiton supp at f/u.  Warned of no otc nsaids w/ meloxicam.  Elev BP - poss due to pain - check at f/u  Meds ordered this encounter  Medications  . meloxicam (MOBIC) 15 MG tablet    Sig: Take 1 tablet (15 mg total) by mouth daily.    Dispense:  30 tablet    Refill:  1     Delman Cheadle, MD MPH

## 2014-05-13 ENCOUNTER — Ambulatory Visit (INDEPENDENT_AMBULATORY_CARE_PROVIDER_SITE_OTHER): Payer: BC Managed Care – PPO | Admitting: Family Medicine

## 2014-05-13 ENCOUNTER — Encounter: Payer: Self-pay | Admitting: Family Medicine

## 2014-05-13 VITALS — BP 140/80 | HR 71 | Temp 98.5°F | Resp 16 | Ht 72.0 in | Wt 237.2 lb

## 2014-05-13 DIAGNOSIS — J302 Other seasonal allergic rhinitis: Secondary | ICD-10-CM

## 2014-05-13 DIAGNOSIS — Z1329 Encounter for screening for other suspected endocrine disorder: Secondary | ICD-10-CM

## 2014-05-13 DIAGNOSIS — Z23 Encounter for immunization: Secondary | ICD-10-CM

## 2014-05-13 DIAGNOSIS — E785 Hyperlipidemia, unspecified: Secondary | ICD-10-CM

## 2014-05-13 DIAGNOSIS — B070 Plantar wart: Secondary | ICD-10-CM

## 2014-05-13 DIAGNOSIS — Z125 Encounter for screening for malignant neoplasm of prostate: Secondary | ICD-10-CM

## 2014-05-13 DIAGNOSIS — Z1389 Encounter for screening for other disorder: Secondary | ICD-10-CM

## 2014-05-13 DIAGNOSIS — Z131 Encounter for screening for diabetes mellitus: Secondary | ICD-10-CM

## 2014-05-13 DIAGNOSIS — G4733 Obstructive sleep apnea (adult) (pediatric): Secondary | ICD-10-CM

## 2014-05-13 DIAGNOSIS — Z0000 Encounter for general adult medical examination without abnormal findings: Secondary | ICD-10-CM

## 2014-05-13 LAB — CBC WITH DIFFERENTIAL/PLATELET
Basophils Absolute: 0 10*3/uL (ref 0.0–0.1)
Basophils Relative: 0 % (ref 0–1)
EOS ABS: 0.3 10*3/uL (ref 0.0–0.7)
Eosinophils Relative: 5 % (ref 0–5)
HCT: 43.5 % (ref 39.0–52.0)
HEMOGLOBIN: 15.1 g/dL (ref 13.0–17.0)
LYMPHS ABS: 1.2 10*3/uL (ref 0.7–4.0)
LYMPHS PCT: 24 % (ref 12–46)
MCH: 30.1 pg (ref 26.0–34.0)
MCHC: 34.7 g/dL (ref 30.0–36.0)
MCV: 86.7 fL (ref 78.0–100.0)
MONOS PCT: 9 % (ref 3–12)
Monocytes Absolute: 0.5 10*3/uL (ref 0.1–1.0)
NEUTROS PCT: 62 % (ref 43–77)
Neutro Abs: 3.2 10*3/uL (ref 1.7–7.7)
Platelets: 194 10*3/uL (ref 150–400)
RBC: 5.02 MIL/uL (ref 4.22–5.81)
RDW: 14.2 % (ref 11.5–15.5)
WBC: 5.2 10*3/uL (ref 4.0–10.5)

## 2014-05-13 LAB — COMPREHENSIVE METABOLIC PANEL
ALT: 25 U/L (ref 0–53)
AST: 21 U/L (ref 0–37)
Albumin: 4.3 g/dL (ref 3.5–5.2)
Alkaline Phosphatase: 53 U/L (ref 39–117)
BILIRUBIN TOTAL: 0.6 mg/dL (ref 0.2–1.2)
BUN: 13 mg/dL (ref 6–23)
CALCIUM: 9.2 mg/dL (ref 8.4–10.5)
CHLORIDE: 105 meq/L (ref 96–112)
CO2: 28 meq/L (ref 19–32)
Creat: 1.1 mg/dL (ref 0.50–1.35)
GLUCOSE: 94 mg/dL (ref 70–99)
Potassium: 4.9 mEq/L (ref 3.5–5.3)
SODIUM: 142 meq/L (ref 135–145)
Total Protein: 6.7 g/dL (ref 6.0–8.3)

## 2014-05-13 LAB — TSH: TSH: 2.172 u[IU]/mL (ref 0.350–4.500)

## 2014-05-13 LAB — POCT URINALYSIS DIPSTICK
Bilirubin, UA: NEGATIVE
Glucose, UA: NEGATIVE
KETONES UA: NEGATIVE
Leukocytes, UA: NEGATIVE
NITRITE UA: NEGATIVE
PH UA: 6.5
Protein, UA: NEGATIVE
RBC UA: NEGATIVE
Spec Grav, UA: <=1.005
Urobilinogen, UA: 0.2

## 2014-05-13 LAB — LIPID PANEL
CHOLESTEROL: 207 mg/dL — AB (ref 0–200)
HDL: 36 mg/dL — ABNORMAL LOW (ref >39)
LDL Cholesterol: 140 mg/dL — ABNORMAL HIGH (ref 0–99)
Total CHOL/HDL Ratio: 5.8 Ratio
Triglycerides: 157 mg/dL — ABNORMAL HIGH (ref <150)
VLDL: 31 mg/dL (ref 0–40)

## 2014-05-13 LAB — HEMOGLOBIN A1C
Hgb A1c MFr Bld: 5.7 % — ABNORMAL HIGH (ref <5.7)
Mean Plasma Glucose: 117 mg/dL — ABNORMAL HIGH (ref <117)

## 2014-05-13 MED ORDER — FLUTICASONE PROPIONATE 50 MCG/ACT NA SUSP: 2.0000 | Freq: Every day | NASAL | Status: DC

## 2014-05-13 NOTE — Patient Instructions (Signed)

## 2014-05-13 NOTE — Progress Notes (Signed)
Subjective:  This chart was scribed for [employer]. Tamala Julian, MD by Terressa Koyanagi, ED Scribe. This patient was seen in room 21 and the patient's care was started at 2:34 PM.     Patient ID: George Garner, male    DOB: [birth year], 58 y.o.   MRN: [id number]  05/13/2014  Annual Exam   HPI HPI Comments: George Garner is a 58 y.o. male, with medical Hx noted below, who presents to the [facility] for his annual physical.   Last Physical: Pt's last physical was done by Dr. Jhonnie Garner on 04/04/2013. Colonoscopy: Last colonoscopy was in 2008 and was normal.  Shingles: Pt received his shingles vaccine at [facility] 2 years ago.  Tetanus Shot: Pt received his last tetanus shot in 2007.  Eye Exam: Pt is due for an eye exam, but denies any cataracts or glaucoma.  Dentist: Pt reports he goes to the dentist three times a year.  Seasonal Allergies: Pt reports taking Zyrtec daily and Flonase as needed.   High Cholesterol: Pt takes fish oil for his cholesterol. Pt notes that he began to watch his diet and workout regularly when he was notified that he has high cholesterol.   Sleep Apnea: Pt discontinued use of his CPAP one year ago as his Sx improved when he lost weight. Pt reports that prior to being Dx his wife witnessed an episode where pt stopped breathing.   Low Testosterone Level: Pt denies any ongoing Tx.   Allergies: Pt reports an allergy to penicillin. Last time pt ingested penicillin he was a teenager, at which time he developed a rash.   Plantar wart: has a plantar wart on thumb that has been present for years; has cryotherapy periodically to area; requesting repeat cryotherapy today.  Neck Pain: Pt reports neck pain at baseline but specifically denies any new or worsening Sx.   Hearing: Pt reports his hearing is not good at baseline, but denies any new or worsening Sx.   Urination: Pt reports waking up at night and using the restroom to urinate 1-2 times per night. Pt notes his  urine stream is weaker.   Sex Drive: Pt reports his sex drive is at baseline. Pt reports seeing a urologist for erectile dysfunction  in the past. Per pt, the urologist informed him that he could not find any physiological problems. Pt denies using any sildenafil. Pt notes that things are going well in his marriage and there are currently no problems resulting from his erectile dysfunction.   Weight: Pt reports he has been trying to lose weight. He works out 3 times a week and keeps his calorie intake to 2100 calories per day. Pt states that despite these measures, the weight is not coming off as easily as he would like.   Sleep Schedule: Pt reports he goes to bed around 10:30/11:00 PM and wakes up at 6:00AM. Pt denies trouble falling, or staying, asleep.   Mood: Pt denies any unusual feelings of prolonged sadness or depression.   Meds: Pt is no longer taking Niaspan. Otherwise, pt is taking all the meds listed below, including Flonase (as needed).   Tobacco and Alcohol Use: Pt is a former smoker who quit 15 years ago. Pt reports infrequent alcohol use.   Denials: Pt denies tinnitus, HAs, blurred vision, sores in his mouth that won't heal, vision disturbances, chest pain, SOB, palpitations, swelling of LE, chronic cough, numbness/ringling/burning in feet, back pain, neck stiffness, back pain, constipation (at times if on  antibiotics), diarrhea, n/v, abd pain, persistent depression, diffiuclty falling asleep or difficulty staying asleep.   Family Hx:  Mother--Pt reports his mother died at 39/90 yo last year. He reports his mother had DM and alzheimer's.  Father--Pt's 32 yo father is still living and has no serious chronic health problems.  Sister-- Pt reports he has one sister without any chronic health problems.  Brothers-- Pt reports that he has brothers, none of whom have chronic health problems.   Personal Hx: Pt reports he has been married for 25 years (will be 26 years in December) and  has three children: 8, 58, and 60 yo. The 12 yo just got engaged and the 59 yo still lives at home.   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   Guns In the Home: Pt denies any guns in the home.   Review of Systems  Constitutional: Negative for fever, chills, diaphoresis, activity change, appetite change, fatigue and unexpected weight change.  HENT: Negative for congestion, dental problem, drooling, ear discharge, ear pain, facial swelling, hearing loss, mouth sores, nosebleeds, postnasal drip, rhinorrhea, sinus pressure, sneezing, sore throat, tinnitus, trouble swallowing and voice change.   Eyes: Negative for photophobia, pain, discharge, redness, itching and visual disturbance.       Negative for blurred vision or double vision  Respiratory: Negative for apnea, cough, choking, chest tightness, shortness of breath, wheezing and stridor.   Cardiovascular: Negative for chest pain, palpitations and leg swelling.  Gastrointestinal: Negative for nausea, vomiting, abdominal pain, diarrhea, constipation and blood in stool.  Endocrine: Negative for cold intolerance, heat intolerance, polydipsia, polyphagia and polyuria.  Genitourinary: Negative for dysuria, urgency, frequency, hematuria, flank pain, decreased urine volume, discharge, penile swelling, scrotal swelling, enuresis, difficulty urinating, genital sores, penile pain and testicular pain.  Musculoskeletal: Positive for neck pain. Negative for arthralgias, back pain, gait problem, joint swelling, myalgias and neck stiffness.  Skin: Negative for color change, pallor, rash and wound.  Allergic/Immunologic: Negative for environmental allergies, food allergies and immunocompromised state.  Neurological: Negative for dizziness, tremors, seizures, syncope, facial asymmetry, speech difficulty, weakness, light-headedness, numbness and headaches.  Hematological: Negative for adenopathy. Does not bruise/bleed  easily.  Psychiatric/Behavioral: Negative for suicidal ideas, hallucinations, behavioral problems, confusion, sleep disturbance, self-injury, dysphoric mood, decreased concentration and agitation. The patient is not nervous/anxious and is not hyperactive.     Past Medical History  Diagnosis Date  . GERD (gastroesophageal reflux disease)   . Dyslipidemia   . Hypogonadism male   . OSA on CPAP   . Obstructive sleep apnea   . Allergy     Zyrtec daily.  Flonase PRN.  Marland Kitchen Hyperlipidemia   . Erectile dysfunction   . BPH (benign prostatic hyperplasia)    Past Surgical History  Procedure Laterality Date  . Gallbladder surgery    . Pilonidal cyst excision    . Vasectomy    . Cholecystectomy    . Eye surgery      L eye tumor benign.   Allergies  Allergen Reactions  . Penicillins Rash   Current Outpatient Prescriptions  Medication Sig Dispense Refill  . aspirin 81 MG tablet Take 81 mg by mouth daily.      . cetirizine (ZYRTEC) 10 MG tablet Take 10 mg by mouth daily.      . fish oil-omega-3 fatty acids 1000 MG capsule Take 3 g by mouth daily.       . meloxicam (MOBIC) 15 MG  tablet Take 1 tablet (15 mg total) by mouth daily.  30 tablet  1  . Multiple Vitamin (MULTIVITAMIN) tablet Take 1 tablet by mouth daily.      . fluticasone (FLONASE) 50 MCG/ACT nasal spray Place 2 sprays into both nostrils daily.  16 g  12  . ipratropium (ATROVENT) 0.06 % nasal spray Place 2 sprays into the nose 4 (four) times daily.  15 mL  1   No current facility-administered medications for this visit.       Objective:    Triage Vitals: BP 140/80  Pulse 71  Temp(Src) 98.5 F (36.9 C) (Oral)  Resp 16  Ht 6' (1.829 m)  Wt 237 lb 3.2 oz (107.593 kg)  BMI 32.16 kg/m2  SpO2 99% Physical Exam  Nursing note and vitals reviewed. Constitutional: He is oriented to person, place, and time. He appears well-developed and well-nourished. No distress.  HENT:  Head: Normocephalic and atraumatic.  Right Ear:  External ear normal.  Left Ear: External ear normal.  Nose: Nose normal.  Mouth/Throat: Oropharynx is clear and moist.  Eyes: Conjunctivae and EOM are normal. Pupils are equal, round, and reactive to light. No scleral icterus.  Neck: Normal range of motion. Neck supple. Carotid bruit is not present. No tracheal deviation present. No thyromegaly present.  Cardiovascular: Normal rate, regular rhythm, normal heart sounds and intact distal pulses.  Exam reveals no gallop and no friction rub.   No murmur heard. Pulmonary/Chest: Effort normal and breath sounds normal. No respiratory distress. He has no wheezes. He has no rales.  Abdominal: Soft. Bowel sounds are normal. He exhibits no distension and no mass. There is no tenderness. There is no rebound and no guarding.  Genitourinary: Testes normal and penis normal. Rectal exam shows external hemorrhoid. Prostate is enlarged. Prostate is not tender. Right testis shows no mass, no swelling and no tenderness. Left testis shows no mass, no swelling and no tenderness. Circumcised.  External hemorrhoid.  Inguinal hernia that was reducible.  Prostate enlarged.   Musculoskeletal: Normal range of motion. He exhibits no edema and no tenderness.       Right shoulder: Normal.       Left shoulder: Normal.       Cervical back: Normal.  Lymphadenopathy:    He has no cervical adenopathy.  Neurological: He is alert and oriented to person, place, and time. He has normal reflexes. He displays normal reflexes. No cranial nerve deficit. He exhibits normal muscle tone. Coordination normal.  Skin: Skin is warm and dry. No rash noted. He is not diaphoretic.  Psychiatric: He has a normal mood and affect. His behavior is normal. Judgment and thought content normal.   Results for orders placed in visit on 05/13/14  CBC WITH DIFFERENTIAL      Result Value Ref Range   WBC 5.2  4.0 - 10.5 K/uL   RBC 5.02  4.22 - 5.81 MIL/uL   Hemoglobin 15.1  13.0 - 17.0 g/dL   HCT 43.5   39.0 - 52.0 %   MCV 86.7  78.0 - 100.0 fL   MCH 30.1  26.0 - 34.0 pg   MCHC 34.7  30.0 - 36.0 g/dL   RDW 14.2  11.5 - 15.5 %   Platelets 194  150 - 400 K/uL   Neutrophils Relative % 62  43 - 77 %   Neutro Abs 3.2  1.7 - 7.7 K/uL   Lymphocytes Relative 24  12 - 46 %   Lymphs Abs 1.2  0.7 - 4.0 K/uL   Monocytes Relative 9  3 - 12 %   Monocytes Absolute 0.5  0.1 - 1.0 K/uL   Eosinophils Relative 5  0 - 5 %   Eosinophils Absolute 0.3  0.0 - 0.7 K/uL   Basophils Relative 0  0 - 1 %   Basophils Absolute 0.0  0.0 - 0.1 K/uL   Smear Review Criteria for review not met    COMPREHENSIVE METABOLIC PANEL      Result Value Ref Range   Sodium 142  135 - 145 mEq/L   Potassium 4.9  3.5 - 5.3 mEq/L   Chloride 105  96 - 112 mEq/L   CO2 28  19 - 32 mEq/L   Glucose, Bld 94  70 - 99 mg/dL   BUN 13  6 - 23 mg/dL   Creat 1.10  0.50 - 1.35 mg/dL   Total Bilirubin 0.6  0.2 - 1.2 mg/dL   Alkaline Phosphatase 53  39 - 117 U/L   AST 21  0 - 37 U/L   ALT 25  0 - 53 U/L   Total Protein 6.7  6.0 - 8.3 g/dL   Albumin 4.3  3.5 - 5.2 g/dL   Calcium 9.2  8.4 - 10.5 mg/dL  HEMOGLOBIN A1C      Result Value Ref Range   Hemoglobin A1C 5.7 (*) <5.7 %   Mean Plasma Glucose 117 (*) <117 mg/dL  LIPID PANEL      Result Value Ref Range   Cholesterol 207 (*) 0 - 200 mg/dL   Triglycerides 157 (*) <150 mg/dL   HDL 36 (*) >39 mg/dL   Total CHOL/HDL Ratio 5.8     VLDL 31  0 - 40 mg/dL   LDL Cholesterol 140 (*) 0 - 99 mg/dL  TSH      Result Value Ref Range   TSH 2.172  0.350 - 4.500 uIU/mL  PSA      Result Value Ref Range   PSA 4.06 (*) <=4.00 ng/mL  POCT URINALYSIS DIPSTICK      Result Value Ref Range   Color, UA yellow     Clarity, UA clear     Glucose, UA neg     Bilirubin, UA neg     Ketones, UA neg     Spec Grav, UA <=1.005     Blood, UA neg     pH, UA 6.5     Protein, UA neg     Urobilinogen, UA 0.2     Nitrite, UA neg     Leukocytes, UA Negative     INFLUENZA VACCINE ADMINISTERED.  CRYOTHERAPY  X 3 ADMINISTERED TO FINGER.    Assessment & Plan:   1. Dyslipidemia   2. Routine general medical examination at a health care facility   3. OSA (obstructive sleep apnea)   4. Screening for prostate cancer   5. Need for influenza vaccination   6. Screening for thyroid disorder   7. Screening for nephropathy   8. Screening for diabetes mellitus   9. Other seasonal allergic rhinitis   10. Plantar wart   .   1.  Complete Physical Examination: anticipatory guidance provided --- weight loss, exercise, ASA.  Colonoscopy UTD.  S/p flu vaccine in office. 2.  OSA: improved with weight loss; no longer compliant with CPAP. Snoring has improved. 3.  Screening for prostate cancer: DRE performed; PSA obtained. 4.  S/p flu vaccine. 5. Screening for thyroid disease: obtain TSH. 6.  Screening  for DMII: obtain glucose and HgbA1c.   7. Dyslipidemia: stable; obtain labs. 8.  Allergic Rhinitis: controlled; refill of Flonase provided. 9. Plantar wart: persistent; s/p cryotherapy x 3 to finger. Meds ordered this encounter  Medications  . fluticasone (FLONASE) 50 MCG/ACT nasal spray    Sig: Place 2 sprays into both nostrils daily.    Dispense:  16 g    Refill:  12    Return in about 1 year (around 05/14/2015) for complete physical examiniation.  I personally performed the services described in this documentation, which was scribed in my presence.  The recorded information has been reviewed and is accurate.  Reginia Forts, M.D.  [facility] [address] [place], [place]  [address] [phone] phone [phone] fax

## 2014-05-14 LAB — PSA: PSA: 4.06 ng/mL — AB (ref <=4.00)

## 2014-05-24 DIAGNOSIS — J302 Other seasonal allergic rhinitis: Secondary | ICD-10-CM | POA: Insufficient documentation

## 2014-05-24 DIAGNOSIS — B070 Plantar wart: Secondary | ICD-10-CM | POA: Insufficient documentation

## 2014-05-24 DIAGNOSIS — Z9989 Dependence on other enabling machines and devices: Secondary | ICD-10-CM

## 2014-05-24 DIAGNOSIS — G4733 Obstructive sleep apnea (adult) (pediatric): Secondary | ICD-10-CM | POA: Insufficient documentation

## 2014-08-06 ENCOUNTER — Encounter: Payer: BC Managed Care – PPO | Admitting: Family Medicine

## 2015-02-05 ENCOUNTER — Telehealth: Payer: Self-pay

## 2015-02-05 NOTE — Telephone Encounter (Signed)
Patient requesting a referral for a colonoscopy. Patients call back number is [phone]

## 2015-02-06 NOTE — Telephone Encounter (Signed)
Spoke with pt, advised pt of Dr. Thompson Caul message. Pt understood and will double check the actual date of his last colonoscopy.

## 2015-02-06 NOTE — Telephone Encounter (Signed)
Call to clarify WHY pt needs a colonoscopy.  Per my note from 05/2014 at his physical, he reported his last colonoscopy was in 2008 and was normal (no polyps); thus, he should not be due for a colonoscopy until 2018?

## 2015-03-14 IMAGING — CR DG KNEE COMPLETE 4+V*L*
4 series · 4 of 4 positions shown · non-contrast
Comparison: None.

CLINICAL DATA: Knee pain.

EXAM:
LEFT KNEE - COMPLETE 4+ VIEW

[AP]
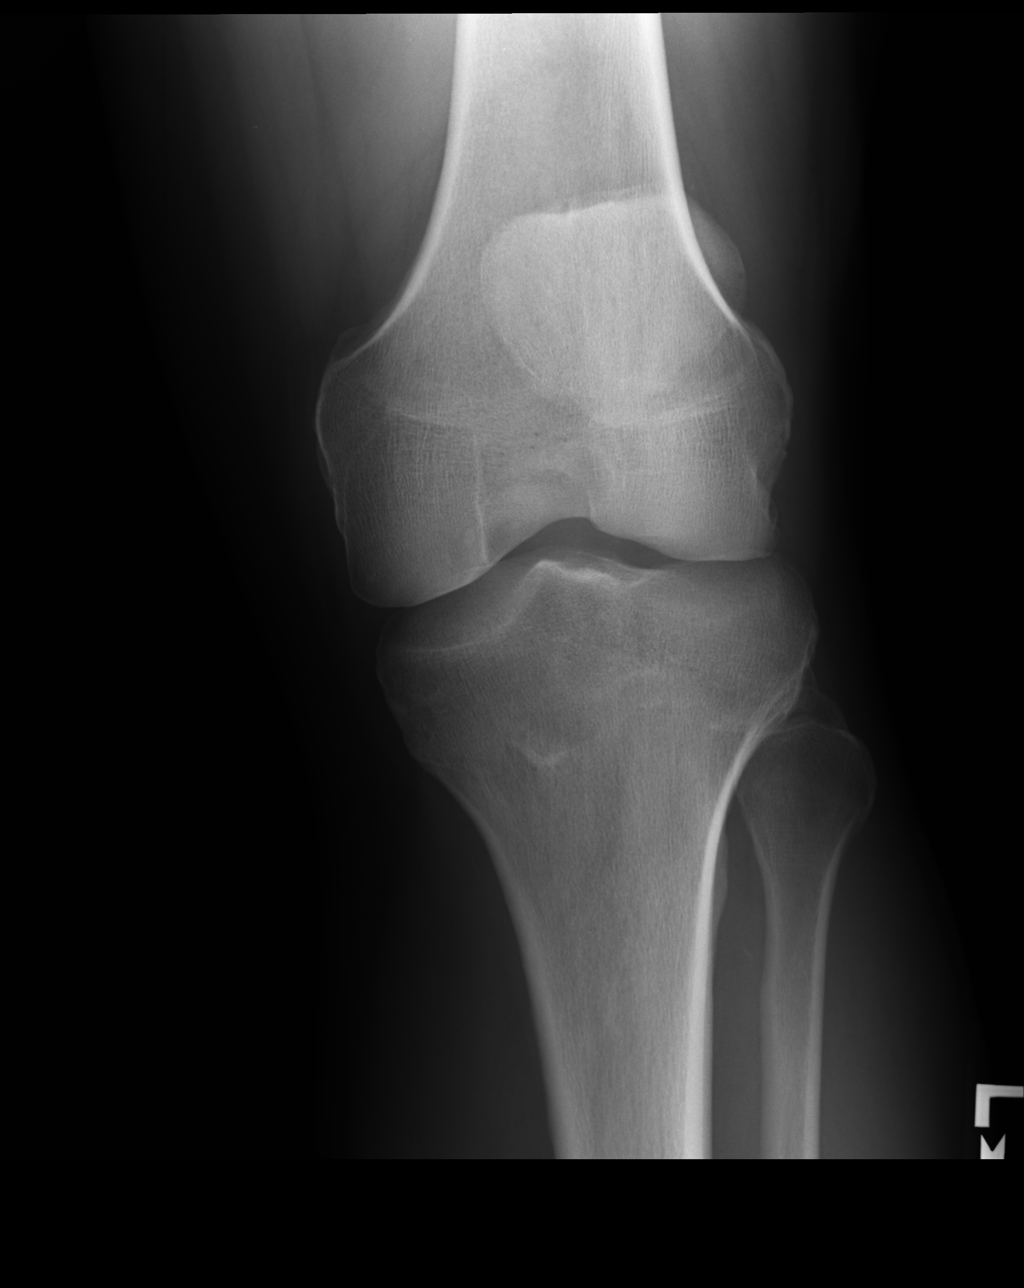

[ap axial]
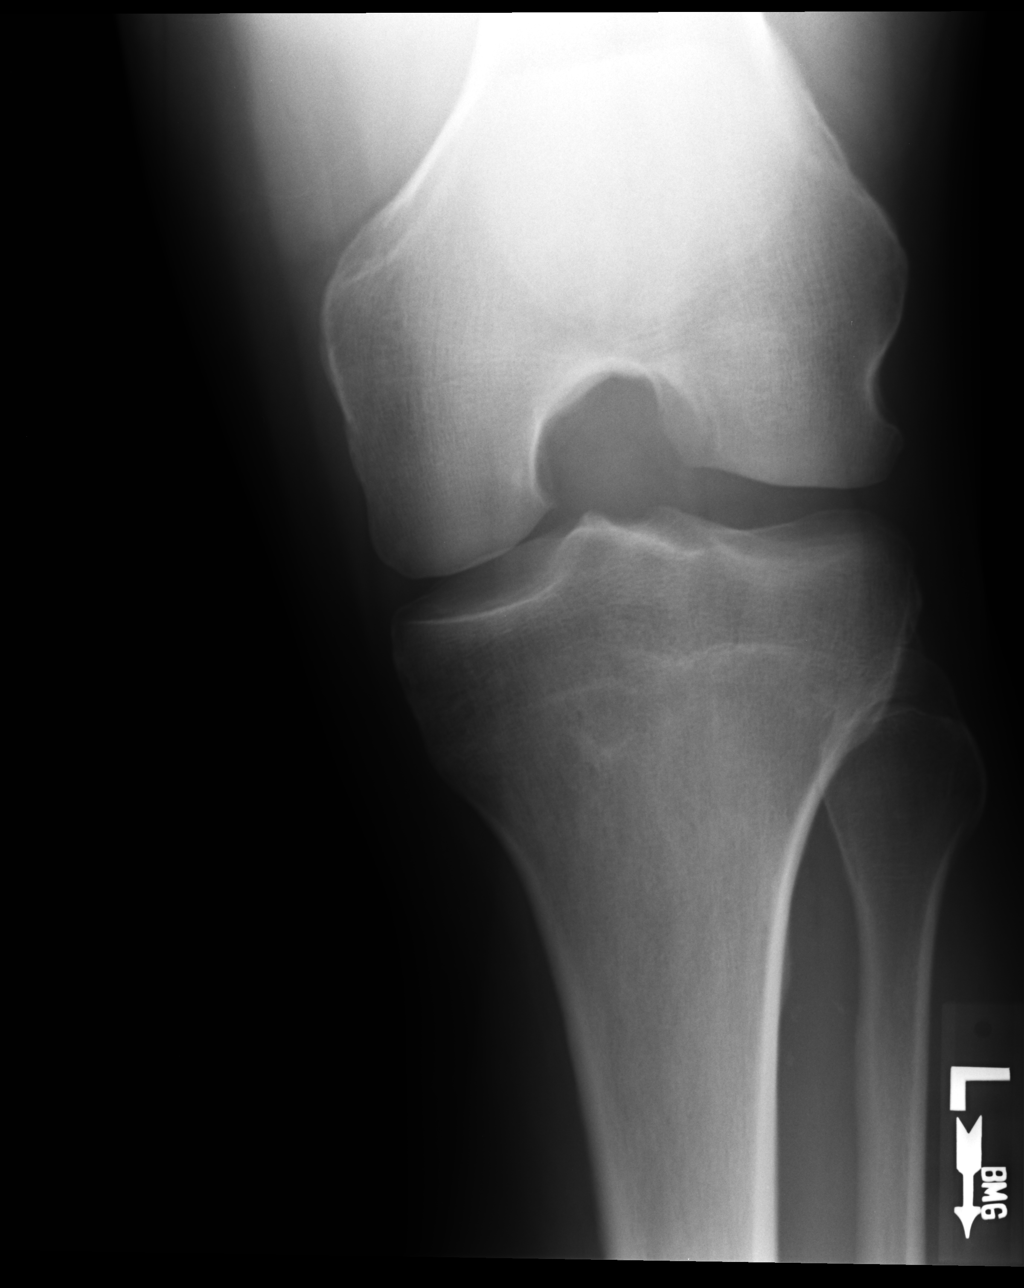

[lateral]
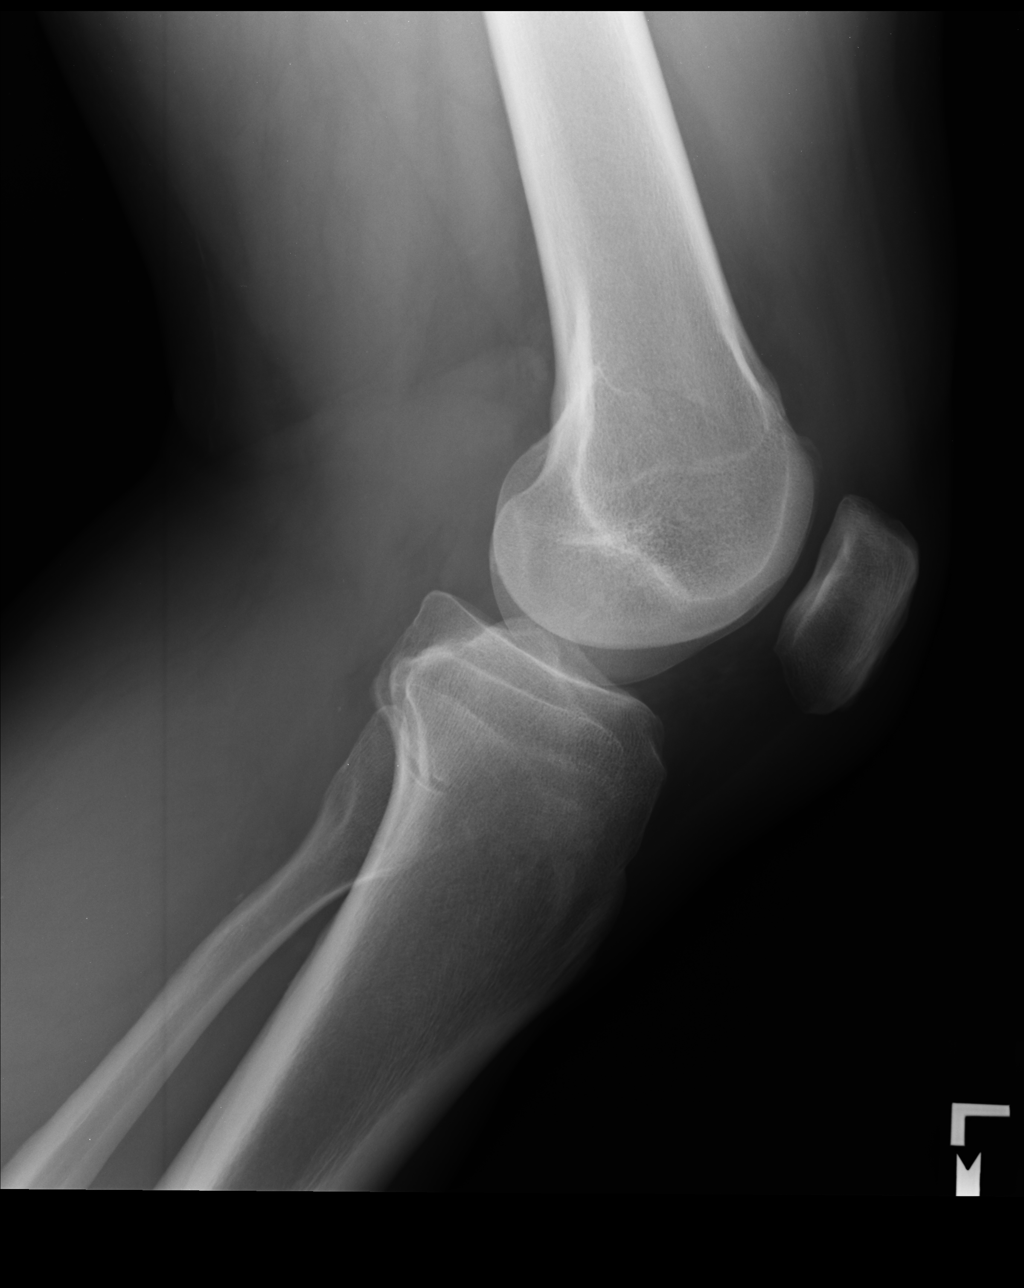

[sunrise]
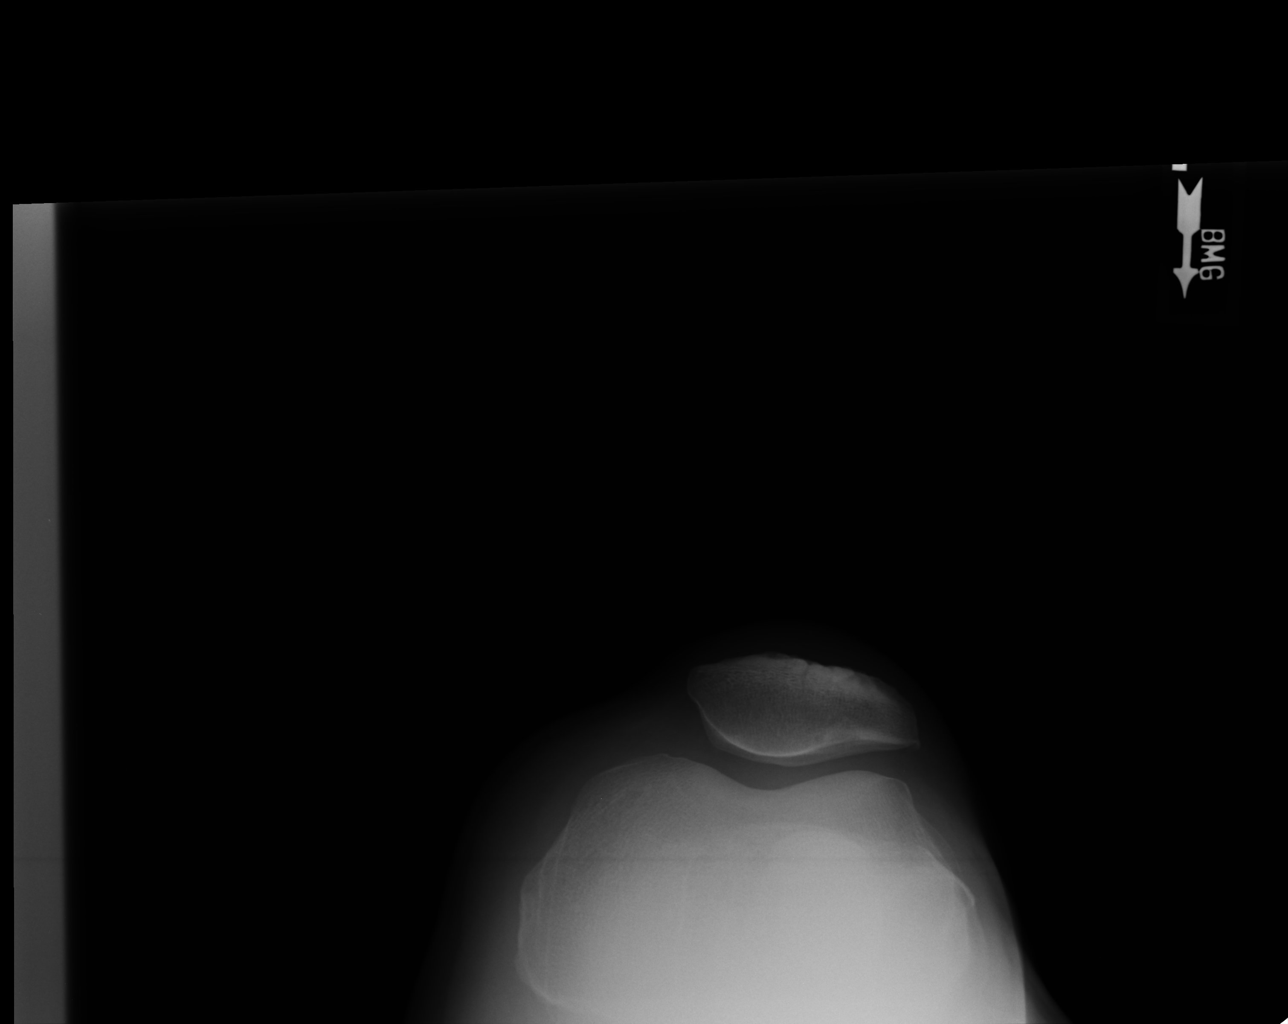

[4 of 4 positions shown; findings below may reference images not displayed]

FINDINGS: There may be a small joint effusion.  No fracture.
IMPRESSION: There may be a small joint effusion.  No fracture.

## 2015-06-23 ENCOUNTER — Ambulatory Visit (INDEPENDENT_AMBULATORY_CARE_PROVIDER_SITE_OTHER): Payer: BC Managed Care – PPO | Admitting: Family Medicine

## 2015-06-23 ENCOUNTER — Encounter: Payer: Self-pay | Admitting: Family Medicine

## 2015-06-23 VITALS — BP 130/70 | HR 76 | Temp 98.7°F | Resp 16 | Ht 72.0 in | Wt 243.6 lb

## 2015-06-23 DIAGNOSIS — R7303 Prediabetes: Secondary | ICD-10-CM | POA: Diagnosis not present

## 2015-06-23 DIAGNOSIS — R7989 Other specified abnormal findings of blood chemistry: Secondary | ICD-10-CM

## 2015-06-23 DIAGNOSIS — Z130 Encounter for screening for diseases of the blood and blood-forming organs and certain disorders involving the immune mechanism: Secondary | ICD-10-CM | POA: Diagnosis not present

## 2015-06-23 DIAGNOSIS — B078 Other viral warts: Secondary | ICD-10-CM

## 2015-06-23 DIAGNOSIS — Z0000 Encounter for general adult medical examination without abnormal findings: Secondary | ICD-10-CM | POA: Diagnosis not present

## 2015-06-23 DIAGNOSIS — Z23 Encounter for immunization: Secondary | ICD-10-CM | POA: Diagnosis not present

## 2015-06-23 DIAGNOSIS — B360 Pityriasis versicolor: Secondary | ICD-10-CM

## 2015-06-23 DIAGNOSIS — E291 Testicular hypofunction: Secondary | ICD-10-CM

## 2015-06-23 DIAGNOSIS — R972 Elevated prostate specific antigen [PSA]: Secondary | ICD-10-CM

## 2015-06-23 DIAGNOSIS — E669 Obesity, unspecified: Secondary | ICD-10-CM

## 2015-06-23 DIAGNOSIS — E785 Hyperlipidemia, unspecified: Secondary | ICD-10-CM

## 2015-06-23 DIAGNOSIS — N529 Male erectile dysfunction, unspecified: Secondary | ICD-10-CM

## 2015-06-23 LAB — COMPLETE METABOLIC PANEL WITH GFR
ALK PHOS: 50 U/L (ref 40–115)
ALT: 42 U/L (ref 9–46)
AST: 29 U/L (ref 10–35)
Albumin: 4.2 g/dL (ref 3.6–5.1)
BUN: 11 mg/dL (ref 7–25)
CALCIUM: 9 mg/dL (ref 8.6–10.3)
CO2: 25 mmol/L (ref 20–31)
CREATININE: 0.95 mg/dL (ref 0.70–1.25)
Chloride: 102 mmol/L (ref 98–110)
GFR, Est Non African American: 87 mL/min (ref >=60)
Glucose, Bld: 83 mg/dL (ref 65–99)
POTASSIUM: 4.2 mmol/L (ref 3.5–5.3)
Sodium: 139 mmol/L (ref 135–146)
Total Bilirubin: 0.6 mg/dL (ref 0.2–1.2)
Total Protein: 7 g/dL (ref 6.1–8.1)

## 2015-06-23 LAB — HEMOGLOBIN A1C
Hgb A1c MFr Bld: 5.6 % (ref <5.7)
MEAN PLASMA GLUCOSE: 114 mg/dL (ref <117)

## 2015-06-23 LAB — CBC
HCT: 43 % (ref 39.0–52.0)
HEMOGLOBIN: 15.4 g/dL (ref 13.0–17.0)
MCH: 30.6 pg (ref 26.0–34.0)
MCHC: 35.8 g/dL (ref 30.0–36.0)
MCV: 85.3 fL (ref 78.0–100.0)
MPV: 9.8 fL (ref 8.6–12.4)
Platelets: 205 10*3/uL (ref 150–400)
RBC: 5.04 MIL/uL (ref 4.22–5.81)
RDW: 14.3 % (ref 11.5–15.5)
WBC: 7.3 10*3/uL (ref 4.0–10.5)

## 2015-06-23 LAB — LIPID PANEL
CHOL/HDL RATIO: 5.3 ratio — AB (ref <=5.0)
CHOLESTEROL: 230 mg/dL — AB (ref 125–200)
HDL: 43 mg/dL (ref >=40)
LDL Cholesterol: 158 mg/dL — ABNORMAL HIGH (ref <130)
Triglycerides: 143 mg/dL (ref <150)
VLDL: 29 mg/dL (ref <30)

## 2015-06-23 MED ORDER — KETOCONAZOLE 200 MG PO TABS: 400.0000 mg | ORAL_TABLET | Freq: Once | ORAL | Status: DC

## 2015-06-23 NOTE — Patient Instructions (Signed)
Breakfast each day, activity/exercise each day, i will refer you to nutritionist.  i will refer you to urologist.  You should receive a call or letter about your lab results within the next week to 10 days.  If wart remains - can freeze again or refer you to dermatology.  nizoral for tinea versicolor - both pills at once and then workout to sweat. Shower after 2-3 hrs.  Return to the clinic or go to the nearest emergency room if any of your symptoms worsen or new symptoms occur. Blood sugar elevated in past (prediabetes)- will recheck levels today.   Warts Warts are small growths on the skin. They are common and can occur on various areas of the body. A person may have one wart or multiple warts. Most warts are not painful, and they usually do not cause problems. However, warts can cause pain if they are large or occur in an area of the body where pressure will be applied to them, such as the bottom of the foot. In many cases, warts do not require treatment. They usually go away on their own over a period of many months to a couple years. Various treatments may be done for warts that cause problems or do not go away. Sometimes, warts go away and then come back again. CAUSES Warts are caused by a type of virus that is called human papillomavirus (HPV). This virus can spread from person to person through direct contact. Warts can also spread to other areas of the body when a person scratches a wart and then scratches another area of his or her body.  RISK FACTORS Warts are more likely to develop in:  People who are 39-42 years of age.  People who have a weakened body defense system (immune system). SYMPTOMS A wart may be round or oval or have an irregular shape. Most warts have a rough surface. Warts may range in color from skin color to light yellow, brown, or gray. They are generally less than  inch (1.3 cm) in size. Most warts are painless, but some can be painful when pressure is applied to  them. DIAGNOSIS A wart can usually be diagnosed from its appearance. In some cases, a tissue sample may be removed (biopsy) to be looked at under a microscope. TREATMENT In many cases, warts do not need treatment. If treatment is needed, options may include:  Applying medicated solutions, creams, or patches to the wart. These may be over-the-counter or prescription medicines that make the skin soft so that layers will gradually shed away. In many cases, the medicine is applied one or two times per day and covered with a bandage.  Putting duct tape over the top of the wart (occlusion). You will leave the tape in place for as long as told by your health care provider, then you will replace it with a new strip of tape. This is done until the wart goes away.  Freezing the wart with liquid nitrogen (cryotherapy).  Burning the wart with:  Laser treatment.  An electrified probe (electrocautery).  Injection of a medicine (Candida antigen) into the wart to help the body's immune system to fight off the wart.  Surgery to remove the wart. HOME CARE INSTRUCTIONS  Apply over-the-counter and prescription medicines only as told by your health care provider.  Do not apply over-the-counter wart medicines to your face or genitals before you ask your health care provider if it is okay to do so.  Do not scratch or pick  at a wart.  Wash your hands after you touch a wart.  Avoid shaving hair that is over a wart.  Keep all follow-up visits as told by your health care provider. This is important. SEEK MEDICAL CARE IF:  Your warts do not improve after treatment.  You have redness, swelling, or pain at the site of a wart.  You have bleeding from a wart that does not stop with light pressure.  You have diabetes and you develop a wart.   This information is not intended to replace advice given to you by your health care provider. Make sure you discuss any questions you have with your health care  provider.   Document Released: 04/28/2005 Document Revised: 04/09/2015 Document Reviewed: 10/14/2014 Elsevier Interactive Patient Education 2016 [place] versicolor is a common fungal infection of the skin. It causes a rash that appears as light or dark patches on the skin. The rash most often occurs on the chest, back, neck, or upper arms. This condition is more common during warm weather. Other than affecting how your skin looks, tinea versicolor usually does not cause other problems. In most cases, the infection goes away in a few weeks with treatment. It may take a few months for the patches on your skin to clear up. CAUSES Tinea versicolor occurs when a type of fungus that is normally present on the skin starts to overgrow. This fungus is a kind of yeast. The exact cause of the overgrowth is not known. This condition cannot be passed from one person to another (noncontagious). RISK FACTORS This condition is more likely to develop when certain factors are present, such as:  Heat and humidity.  Sweating too much.  Hormone changes.  Oily skin.  A weak defense (immune) system. SYMPTOMS Symptoms of this condition may include:  A rash on your skin that is made up of light or dark patches. The rash may have:  Patches of tan or pink spots on light skin.  Patches of white or brown spots on dark skin.  Patches of skin that do not tan.  Well-marked edges.  Scales on the discolored areas.  Mild itching. DIAGNOSIS A health care provider can usually diagnose this condition by looking at your skin. During the exam, he or she may use ultraviolet light to help determine the extent of the infection. In some cases, a skin sample may be taken by scraping the rash. This sample will be viewed under a microscope to check for yeast overgrowth. TREATMENT Treatment for this condition may include:  Dandruff shampoo that is applied to the affected skin during  showers or bathing.  Over-the-counter medicated skin cream, lotion, or soaps.  Prescription antifungal medicine in the form of skin cream or pills.  Medicine to help reduce itching. HOME CARE INSTRUCTIONS  Take medicines only as directed by your health care provider.  Apply dandruff shampoo to the affected area if told to do so by your health care provider. You may be instructed to scrub the affected skin for several minutes each day.  Do not scratch the affected area of skin.  Avoid hot and humid conditions.  Do not use tanning booths.  Try to avoid sweating a lot. SEEK MEDICAL CARE IF:  Your symptoms get worse.  You have a fever.  You have redness, swelling, or pain at the site of your rash.  You have fluid, blood, or pus coming from your rash.  Your rash returns after treatment.  This information is not intended to replace advice given to you by your health care provider. Make sure you discuss any questions you have with your health care provider.   Document Released: 07/16/2000 Document Revised: 08/09/2014 Document Reviewed: 04/30/2014 Elsevier Interactive Patient Education 2016 [place] you healthy  Get these tests  Blood pressure- Have your blood pressure checked once a year by your healthcare provider.  Normal blood pressure is 120/80  Weight- Have your body mass index (BMI) calculated to screen for obesity.  BMI is a measure of body fat based on height and weight. You can also calculate your own BMI at [email or web address].  Cholesterol- Have your cholesterol checked every year.  Diabetes- Have your blood sugar checked regularly if you have high blood pressure, high cholesterol, have a family history of diabetes or if you are overweight.  Screening for Colon Cancer- Colonoscopy starting at age 12.  Screening may begin sooner depending on your family history and other health conditions. Follow up colonoscopy as directed by your  Gastroenterologist.  Screening for Prostate Cancer- Both blood work (PSA) and a rectal exam help screen for Prostate Cancer.  Screening begins at age 81 with African-American men and at age 86 with Caucasian men.  Screening may begin sooner depending on your family history.  Take these medicines  Aspirin- One aspirin daily can help prevent Heart disease and Stroke.  Flu shot- Every fall.  Tetanus- Every 10 years.  Zostavax- Once after the age of 23 to prevent Shingles.  Pneumonia shot- Once after the age of 62; if you are younger than 69, ask your healthcare provider if you need a Pneumonia shot.  Take these steps  Don't smoke- If you do smoke, talk to your doctor about quitting.  For tips on how to quit, go to www.smokefree.gov or call 1-800-QUIT-NOW.  Be physically active- Exercise 5 days a week for at least 30 minutes.  If you are not already physically active start slow and gradually work up to 30 minutes of moderate physical activity.  Examples of moderate activity include walking briskly, mowing the yard, dancing, swimming, bicycling, etc.  Eat a healthy diet- Eat a variety of healthy food such as fruits, vegetables, low fat milk, low fat cheese, yogurt, lean meant, poultry, fish, beans, tofu, etc. For more information go to www.thenutritionsource.org  Drink alcohol in moderation- Limit alcohol intake to less than two drinks a day. Never drink and drive.  Dentist- Brush and floss twice daily; visit your dentist twice a year.  Depression- Your emotional health is as important as your physical health. If you're feeling down, or losing interest in things you would normally enjoy please talk to your healthcare provider.  Eye exam- Visit your eye doctor every year.  Safe sex- If you may be exposed to a sexually transmitted infection, use a condom.  Seat belts- Seat belts can save your life; always wear one.  Smoke/Carbon Monoxide detectors- These detectors need to be installed on  the appropriate level of your home.  Replace batteries at least once a year.  Skin cancer- When out in the sun, cover up and use sunscreen 15 SPF or higher.  Violence- If anyone is threatening you, please tell your healthcare provider.  Living Will/ Health care power of attorney- Speak with your healthcare provider and family.

## 2015-06-23 NOTE — Progress Notes (Signed)
Subjective:    Patient ID: George Garner, male    DOB: 1956-02-13, 59 y.o.   MRN: [id number] This chart was scribed for Merri Ray, MD by Zola Button, Medical Scribe. This patient was seen in Room 26 and the patient's care was started at 2:03 PM.    HPI HPI Comments: George Garner is a 59 y.o. male who presents to the [facility] for an annual exam. Last physical was here in October, 2015 with Dr. Tamala Julian.   Cancer screening: Colon cancer screening -  Colonoscopy in 2008, normal Prostate cancer screening - PSA was elevated, but was overall stable from 1 year prior of 4.12. He was evaluated by urology in November, 2014. They believed he just had an abnormally large prostate and did not recommend any biopsies at that time. He was told to follow-up on it. Patient denies difficulty urinating or other urinary changes. Lab Results  Component Value Date   PSA 4.06* 05/13/2014   PSA 4.12* 04/04/2013   PSA 3.30 12/24/2011   Immunizations: He was given shingles vaccination in January, 2013. Flu vaccine given today. Immunization History  Administered Date(s) Administered  . Influenza Whole 04/29/2011  . Influenza,inj,Quad PF,36+ Mos 05/13/2014, 06/23/2015  . Tdap 06/08/2006  . Zoster 08/03/2011    Depression screening:  Depression screen [facility] 2/9 06/23/2015 05/13/2014  Decreased Interest 0 0  Down, Depressed, Hopeless 0 0  PHQ - 2 Score 0 0   Optho: He had his eyes examined within the past year.  Dentist: He sees a Pharmacist, community regularly.  Obstructive sleep apnea: Discontinued CPAP use. Patient notes his spouse has noticed has noticed less snoring and less apnea at night since losing weight. He states he wakes up around 6:00 AM regardless of what time he goes to bed.  Weight gain: Patient has been having some difficulty losing weight. He weighted around 260 lbs 2 years ago and was able to lose 50 lbs with diet and exercise. Over the holidays last year, he did not follow his diet as  strictly and gained about 20 lbs back, to 230 lbs. He has remained around 230 lbs but has gained around 10 lbs over the past few months. Patient used to exercise an hour a day, 5 times a week but is no longer exercising regularly because he felt it was ineffective at losing weight; however, he is still active working in the yard. He currently eats 2 meals a day: a small meal around noon (protein shake), then eats a larger meal at night. He feels hungry throughout the day until his evening meal. He drinks about 2-3 sodas a week and drinks alcohol seldomly. He does not drink tea or juice. Patient is willing to try seeing a nutritionist. Wt Readings from Last 3 Encounters:  06/23/15 243 lb 9.6 oz (110.496 kg)  05/13/14 237 lb 3.2 oz (107.593 kg)  04/25/14 237 lb (107.502 kg)   Body mass index is 33.03 kg/(m^2).   Plantar wart: Patient reports having a plantar wart on his right thumb. He has intermittent pain to the area. He has had this frozen 3 times which has made it decrease in size, but has never completely resolved it. He states he last had it frozen at his last physical exam about a year ago.  Tinea versicolor: Patient states he has been treated for this with a medication in the past. He reports having some reoccurrence of this in his upper back and upper chest.  History of hypogonadism:  with erectile dysfunction (difficulty maintaining erections). Patient is not taking any medications for this. He was on testosterone for a short period of time, but was taken off of it because it was not helping. He has tried Viagra, Levitra, and Cialis but without relief.  History of hyperglycemia:  Lab Results  Component Value Date   HGBA1C 5.7* 05/13/2014     Patient Active Problem List   Diagnosis Date Noted  . Other seasonal allergic rhinitis 05/24/2014  . OSA (obstructive sleep apnea) 05/24/2014  . Plantar wart 05/24/2014  . GERD (gastroesophageal reflux disease)   . Dyslipidemia   . HYPOGODADISM  MALE   . OBSTRUCTIVE SLEEP APNEA 11/06/2008  . COUGH 11/06/2008   Past Medical History  Diagnosis Date  . GERD (gastroesophageal reflux disease)   . Dyslipidemia   . Hypogonadism male   . OSA on CPAP   . Obstructive sleep apnea   . Allergy     Zyrtec daily.  Flonase PRN.  Marland Kitchen Hyperlipidemia   . Erectile dysfunction   . BPH (benign prostatic hyperplasia)    Past Surgical History  Procedure Laterality Date  . Gallbladder surgery    . Pilonidal cyst excision    . Vasectomy    . Cholecystectomy    . Eye surgery      L eye tumor benign.  . Deviated septum-cyst     Allergies  Allergen Reactions  . Penicillins Rash   Prior to Admission medications   Medication Sig Start Date End Date Taking? Authorizing Provider  aspirin 81 MG tablet Take 81 mg by mouth daily.   Yes Historical Provider, MD  fish oil-omega-3 fatty acids 1000 MG capsule Take 3 g by mouth daily.    Yes Historical Provider, MD  Multiple Vitamin (MULTIVITAMIN) tablet Take 1 tablet by mouth daily.   Yes Historical Provider, MD  cetirizine (ZYRTEC) 10 MG tablet Take 10 mg by mouth daily.    Historical Provider, MD  fluticasone (FLONASE) 50 MCG/ACT nasal spray Place 2 sprays into both nostrils daily. 05/13/14 05/13/15  Wardell Honour, MD  ipratropium (ATROVENT) 0.06 % nasal spray Place 2 sprays into the nose 4 (four) times daily. 03/13/12 04/04/13  Wendie Agreste, MD  meloxicam (MOBIC) 15 MG tablet Take 1 tablet (15 mg total) by mouth daily. Patient not taking: Reported on 06/23/2015 04/25/14   Shawnee Knapp, MD   Social History   Social History  . Marital Status: Married    Spouse Name: N/A  . Number of Children: N/A  . Years of Education: N/A   Occupational History  . minister    Social History Main Topics  . Smoking status: Former Research scientist (life sciences)  . Smokeless tobacco: Not on file  . Alcohol Use: 0.5 oz/week    1 Standard drinks or equivalent per week  . Drug Use: No  . Sexual Activity: Yes    Birth Control/  Protection: Other-see comments     Comment: vasectomy   Other Topics Concern  . Not on file   Social History Narrative   Marital status: married x 26 years.      Children: 3 children (23, 21, 16); no grandchildren.      Lives: with wife, 1 child at home       Employment: [employer] x 13 years.      Tobacco: quit smoking 15 years ago.      Alcohol: rarely      Exercise:  3 times per week (weights, cardio).  Seatbelt: 100%      Guns: none     Review of Systems 13 point ROS reviewed on patient health survey. Negative other than listed above or in nursing note. See nursing note.     Objective:   Physical Exam  Constitutional: He is oriented to person, place, and time. He appears well-developed and well-nourished.  HENT:  Head: Normocephalic and atraumatic.  Right Ear: External ear normal.  Left Ear: External ear normal.  Mouth/Throat: Oropharynx is clear and moist.  Eyes: Conjunctivae and EOM are normal. Pupils are equal, round, and reactive to light.  Neck: Normal range of motion. Neck supple. No thyromegaly present.  Cardiovascular: Normal rate, regular rhythm, normal heart sounds and intact distal pulses.   Pulmonary/Chest: Effort normal and breath sounds normal. No respiratory distress. He has no wheezes.  Abdominal: Soft. He exhibits no distension. There is no tenderness. Hernia confirmed negative in the right inguinal area and confirmed negative in the left inguinal area.  Genitourinary: Prostate normal.  Musculoskeletal: Normal range of motion. He exhibits no edema or tenderness.  Lymphadenopathy:    He has no cervical adenopathy.  Neurological: He is alert and oriented to person, place, and time. He has normal reflexes.  Skin: Skin is warm and dry.  Very small, approximately 1-2 mm common wart on the pad of the right thumb.   Hypopigmented patches over his upper back and upper chest.  Psychiatric: He has a normal mood and affect. His behavior is  normal.  Vitals reviewed.   R/b/a for cryodestruction of common wart of right thumb discussed. Verbal Consent obtained. Liquid nitrogen applied with 4 freeze-thaw cycles (initial cycle appeared to be too superficial). No complications. Blistering/wound care and RTC precautions discussed.  Filed Vitals:   06/23/15 1330  BP: 130/70  Pulse: 76  Temp: 98.7 F (37.1 C)  TempSrc: Oral  Resp: 16  Height: 6' (1.829 m)  Weight: 243 lb 9.6 oz (110.496 kg)  SpO2: 97%       Assessment & Plan:   George Garner is a 59 y.o. male Annual physical exam - Plan: TSH  -anticipatory guidance as below in AVS, screening labs above. Health maintenance items as above in HPI discussed/recommended as applicable.   Flu vaccine need - Plan: Flu Vaccine QUAD 36+ mos IM  Obesity - Plan: Amb ref to Medical Nutrition Therapy-MNT,   -Will refer to nutritionist to look into other options to help him with weight loss. Recommended change in his meals around so he does not have such a large dinner, starting with breakfast each day, which may include a protein shake, and small healthy snacks throughout the day to simulate his metabolism. Also discussed increased activity/exercise.  Common wart  -Cryodestruction as above with liquid nitrogen. Advised that if this did not take care of it this time, return sooner so that we can either repeat  liquid nitrogen, or can refer to dermatology. Erectile dysfunction, unspecified erectile dysfunction type - Plan: Testosterone, Ambulatory referral to Urology  Elevated PSA - Plan: PSA, Ambulatory referral to Urology Low testosterone - Plan: Testosterone, Ambulatory referral to Urology  - With history of erectile dysfunction. Her history of low testosterone. Also with history of elevated PSA. Commended repeat evaluation with a urologist, but will like a second opinion or different urologist. Referred to different urologist. Testosterone level and PSA pending.  Screening, anemia,  deficiency, iron - Plan: CBC  Hyperlipidemia - Plan: COMPLETE METABOLIC PANEL WITH GFR, Lipid panel, Testosterone  -Labs pending  Prediabetes - Plan: Hemoglobin A1c pending. Work on weight loss as above.  Tinea versicolor - Plan: ketoconazole (NIZORAL) 200 MG tablet  -Repeat treatment with Nizoral, 400 mg 1, then sweating with exercise. RTC if symptoms persist.  Meds ordered this encounter  Medications  . ketoconazole (NIZORAL) 200 MG tablet    Sig: Take 2 tablets (400 mg total) by mouth once.    Dispense:  2 tablet    Refill:  0   Patient Instructions  Breakfast each day, activity/exercise each day, i will refer you to nutritionist.  i will refer you to urologist.  You should receive a call or letter about your lab results within the next week to 10 days.  If wart remains - can freeze again or refer you to dermatology.  nizoral for tinea versicolor - both pills at once and then workout to sweat. Shower after 2-3 hrs.  Return to the clinic or go to the nearest emergency room if any of your symptoms worsen or new symptoms occur. Blood sugar elevated in past (prediabetes)- will recheck levels today.   Warts Warts are small growths on the skin. They are common and can occur on various areas of the body. A person may have one wart or multiple warts. Most warts are not painful, and they usually do not cause problems. However, warts can cause pain if they are large or occur in an area of the body where pressure will be applied to them, such as the bottom of the foot. In many cases, warts do not require treatment. They usually go away on their own over a period of many months to a couple years. Various treatments may be done for warts that cause problems or do not go away. Sometimes, warts go away and then come back again. CAUSES Warts are caused by a type of virus that is called human papillomavirus (HPV). This virus can spread from person to person through direct contact. Warts can also  spread to other areas of the body when a person scratches a wart and then scratches another area of his or her body.  RISK FACTORS Warts are more likely to develop in:  People who are 47-53 years of age.  People who have a weakened body defense system (immune system). SYMPTOMS A wart may be round or oval or have an irregular shape. Most warts have a rough surface. Warts may range in color from skin color to light yellow, brown, or gray. They are generally less than  inch (1.3 cm) in size. Most warts are painless, but some can be painful when pressure is applied to them. DIAGNOSIS A wart can usually be diagnosed from its appearance. In some cases, a tissue sample may be removed (biopsy) to be looked at under a microscope. TREATMENT In many cases, warts do not need treatment. If treatment is needed, options may include:  Applying medicated solutions, creams, or patches to the wart. These may be over-the-counter or prescription medicines that make the skin soft so that layers will gradually shed away. In many cases, the medicine is applied one or two times per day and covered with a bandage.  Putting duct tape over the top of the wart (occlusion). You will leave the tape in place for as long as told by your health care provider, then you will replace it with a new strip of tape. This is done until the wart goes away.  Freezing the wart with liquid nitrogen (cryotherapy).  Burning the wart with:  Laser treatment.  An electrified probe (electrocautery).  Injection of a medicine (Candida antigen) into the wart to help the body's immune system to fight off the wart.  Surgery to remove the wart. HOME CARE INSTRUCTIONS  Apply over-the-counter and prescription medicines only as told by your health care provider.  Do not apply over-the-counter wart medicines to your face or genitals before you ask your health care provider if it is okay to do so.  Do not scratch or pick at a wart.  Wash  your hands after you touch a wart.  Avoid shaving hair that is over a wart.  Keep all follow-up visits as told by your health care provider. This is important. SEEK MEDICAL CARE IF:  Your warts do not improve after treatment.  You have redness, swelling, or pain at the site of a wart.  You have bleeding from a wart that does not stop with light pressure.  You have diabetes and you develop a wart.   This information is not intended to replace advice given to you by your health care provider. Make sure you discuss any questions you have with your health care provider.   Document Released: 04/28/2005 Document Revised: 04/09/2015 Document Reviewed: 10/14/2014 Elsevier Interactive Patient Education 2016 [place] versicolor is a common fungal infection of the skin. It causes a rash that appears as light or dark patches on the skin. The rash most often occurs on the chest, back, neck, or upper arms. This condition is more common during warm weather. Other than affecting how your skin looks, tinea versicolor usually does not cause other problems. In most cases, the infection goes away in a few weeks with treatment. It may take a few months for the patches on your skin to clear up. CAUSES Tinea versicolor occurs when a type of fungus that is normally present on the skin starts to overgrow. This fungus is a kind of yeast. The exact cause of the overgrowth is not known. This condition cannot be passed from one person to another (noncontagious). RISK FACTORS This condition is more likely to develop when certain factors are present, such as:  Heat and humidity.  Sweating too much.  Hormone changes.  Oily skin.  A weak defense (immune) system. SYMPTOMS Symptoms of this condition may include:  A rash on your skin that is made up of light or dark patches. The rash may have:  Patches of tan or pink spots on light skin.  Patches of white or brown spots on  dark skin.  Patches of skin that do not tan.  Well-marked edges.  Scales on the discolored areas.  Mild itching. DIAGNOSIS A health care provider can usually diagnose this condition by looking at your skin. During the exam, he or she may use ultraviolet light to help determine the extent of the infection. In some cases, a skin sample may be taken by scraping the rash. This sample will be viewed under a microscope to check for yeast overgrowth. TREATMENT Treatment for this condition may include:  Dandruff shampoo that is applied to the affected skin during showers or bathing.  Over-the-counter medicated skin cream, lotion, or soaps.  Prescription antifungal medicine in the form of skin cream or pills.  Medicine to help reduce itching. HOME CARE INSTRUCTIONS  Take medicines only as directed by your health care provider.  Apply dandruff shampoo to the affected area if told to do so by your health care provider. You may be instructed  to scrub the affected skin for several minutes each day.  Do not scratch the affected area of skin.  Avoid hot and humid conditions.  Do not use tanning booths.  Try to avoid sweating a lot. SEEK MEDICAL CARE IF:  Your symptoms get worse.  You have a fever.  You have redness, swelling, or pain at the site of your rash.  You have fluid, blood, or pus coming from your rash.  Your rash returns after treatment.   This information is not intended to replace advice given to you by your health care provider. Make sure you discuss any questions you have with your health care provider.   Document Released: 07/16/2000 Document Revised: 08/09/2014 Document Reviewed: 04/30/2014 Elsevier Interactive Patient Education 2016 [place] you healthy  Get these tests  Blood pressure- Have your blood pressure checked once a year by your healthcare provider.  Normal blood pressure is 120/80  Weight- Have your body mass index (BMI)  calculated to screen for obesity.  BMI is a measure of body fat based on height and weight. You can also calculate your own BMI at [employer].  Cholesterol- Have your cholesterol checked every year.  Diabetes- Have your blood sugar checked regularly if you have high blood pressure, high cholesterol, have a family history of diabetes or if you are overweight.  Screening for Colon Cancer- Colonoscopy starting at age 46.  Screening may begin sooner depending on your family history and other health conditions. Follow up colonoscopy as directed by your Gastroenterologist.  Screening for Prostate Cancer- Both blood work (PSA) and a rectal exam help screen for Prostate Cancer.  Screening begins at age 77 with African-American men and at age 34 with Caucasian men.  Screening may begin sooner depending on your family history.  Take these medicines  Aspirin- One aspirin daily can help prevent Heart disease and Stroke.  Flu shot- Every fall.  Tetanus- Every 10 years.  Zostavax- Once after the age of 32 to prevent Shingles.  Pneumonia shot- Once after the age of 56; if you are younger than 31, ask your healthcare provider if you need a Pneumonia shot.  Take these steps  Don't smoke- If you do smoke, talk to your doctor about quitting.  For tips on how to quit, go to www.smokefree.gov or call 1-800-QUIT-NOW.  Be physically active- Exercise 5 days a week for at least 30 minutes.  If you are not already physically active start slow and gradually work up to 30 minutes of moderate physical activity.  Examples of moderate activity include walking briskly, mowing the yard, dancing, swimming, bicycling, etc.  Eat a healthy diet- Eat a variety of healthy food such as fruits, vegetables, low fat milk, low fat cheese, yogurt, lean meant, poultry, fish, beans, tofu, etc. For more information go to www.thenutritionsource.org  Drink alcohol in moderation- Limit alcohol intake to less than two  drinks a day. Never drink and drive.  Dentist- Brush and floss twice daily; visit your dentist twice a year.  Depression- Your emotional health is as important as your physical health. If you're feeling down, or losing interest in things you would normally enjoy please talk to your healthcare provider.  Eye exam- Visit your eye doctor every year.  Safe sex- If you may be exposed to a sexually transmitted infection, use a condom.  Seat belts- Seat belts can save your life; always wear one.  Smoke/Carbon Monoxide detectors- These detectors need to be installed on the appropriate  level of your home.  Replace batteries at least once a year.  Skin cancer- When out in the sun, cover up and use sunscreen 15 SPF or higher.  Violence- If anyone is threatening you, please tell your healthcare provider.  Living Will/ Health care power of attorney- Speak with your healthcare provider and family.    I personally performed the services described in this documentation, which was scribed in my presence. The recorded information has been reviewed and considered, and addended by me as needed.    By signing my name below, I, Zola Button, attest that this documentation has been prepared under the direction and in the presence of Merri Ray, MD.  Electronically Signed: Zola Button, Medical Scribe. 06/23/2015. 2:03 PM.

## 2015-06-23 NOTE — Progress Notes (Signed)
   Subjective:    Patient ID: George Garner, male    DOB: 1956/07/22, 59 y.o.   MRN: [id number]  HPI    Review of Systems  Constitutional: Negative.   HENT: Positive for hearing loss.   Eyes: Negative.   Respiratory: Negative.   Cardiovascular: Negative.   Gastrointestinal: Negative.   Endocrine: Negative.   Genitourinary: Negative.   Musculoskeletal: Positive for neck pain and neck stiffness.  Skin: Positive for rash.  Allergic/Immunologic: Positive for environmental allergies.  Neurological: Negative.   Hematological: Negative.   Psychiatric/Behavioral: Negative.        Objective:   Physical Exam        Assessment & Plan:

## 2015-06-24 LAB — TSH: TSH: 1.449 u[IU]/mL (ref 0.350–4.500)

## 2015-06-24 LAB — TESTOSTERONE: TESTOSTERONE: 332 ng/dL (ref 300–890)

## 2015-06-24 LAB — PSA: PSA: 4.35 ng/mL — AB (ref <=4.00)

## 2015-07-14 ENCOUNTER — Telehealth: Payer: Self-pay

## 2015-07-14 NOTE — Telephone Encounter (Signed)
Pt called requesting lab results from 06/23/15. Please review labs.  Thank you.

## 2015-09-01 ENCOUNTER — Encounter: Payer: Self-pay | Admitting: Skilled Nursing Facility1

## 2015-09-01 ENCOUNTER — Encounter: Payer: BC Managed Care – PPO | Attending: Family Medicine | Admitting: Skilled Nursing Facility1

## 2015-09-01 VITALS — Ht 72.0 in | Wt 240.0 lb

## 2015-09-01 DIAGNOSIS — E669 Obesity, unspecified: Secondary | ICD-10-CM | POA: Diagnosis not present

## 2015-09-01 DIAGNOSIS — Z713 Dietary counseling and surveillance: Secondary | ICD-10-CM | POA: Diagnosis not present

## 2015-09-01 DIAGNOSIS — Z6832 Body mass index (BMI) 32.0-32.9, adult: Secondary | ICD-10-CM | POA: Insufficient documentation

## 2015-09-01 NOTE — Patient Instructions (Signed)
-  Reflect on some activities you can do for when you are not hungry but you want to eat

## 2015-09-01 NOTE — Progress Notes (Signed)
  Medical Nutrition Therapy:  Appt start time: 0800 end time:  0915.   Assessment:  Primary concerns today: referred for obesity. Pt states he prefers to be called George Garner.  Pt states he was 260 pounds but then lost to 240 pounds and then to 210 pounds: walking and watching eating. Pt states he sleeps about 6-7 hours with rest most often. Pt states he had sleep apnea when he was at his heaviest. Pt states he does not wear his C-PAP machine due to discomfort. Pt states he has a good energy level throughout the day. Pt states he has been walking and watching his diet for a few years. Pt states he tries to delay breakfast as long as he can-most often skipping altogether. Pt states he uses my fitness pal to track his calories and has been doing this for about 5 years, his goal is 2100 calories but he undercuts that by an average of 300 calories every day so he is only consuming 1800 calories (inappropriate for a male of 6 feet that is active). Pt was indignant throughout the appointment and did not trust in the dietitians education, pt wanted a "meal plan" which is what he was already doing with [employer] with no success. Pt replied, "no I will not do that" to all suggestions offered.  Preferred Learning Style:   No preference indicated   Learning Readiness:   Not ready  MEDICATIONS: See List   DIETARY INTAKE:  Usual eating pattern includes 2 meals and 2 snacks per day.  Everyday foods include protein shake/protein bars.  Avoided foods include none stated.    24-hr recall:  B ( AM): none-----sometimes a english muffin with bacon and egg Snk ( AM): protein bars L ( PM): protein shake: whey based Snk ( PM): peanut butter crackers D ( PM): salad----spagetti Snk ( PM): ice cream Beverages: water, pot of coffee, soda  Usual physical activity: 5 days a week 60 minutes on the treadmill  Estimated energy needs: 2100 calories 235 g carbohydrates 158 g protein 58 g fat  Progress Towards  Goal(s):  In progress.   Nutritional Diagnosis:  NB-1.1 Food and nutrition-related knowledge deficit As related to no prior nutrition education from a nutrition professional.  As evidenced by pt report, 24 hr recall-meal skipping, inappropriate caloric consumption.    Intervention:  Nutrition counseling for obesity. Dietitian educated the pt on eating throughout the day, the inappropriate use of inappropriate protein bars/shakes, caloric needs/underconsumption/overconsumption, hunger/fullness cues, and the importance of changing up diet/physical activity routine.  Teaching Method Utilized:  Visual Auditory  Handouts given during visit include:  Snack sheet  vegetarian proteins  Barriers to learning/adherence to lifestyle change: inability to move past initial thoughts/feelings about food Demonstrated degree of understanding via:  Teach Back   Monitoring/Evaluation:  Dietary intake, exercise, cholesterol, A1C, and body weight prn.

## 2015-10-13 ENCOUNTER — Ambulatory Visit (INDEPENDENT_AMBULATORY_CARE_PROVIDER_SITE_OTHER): Payer: BC Managed Care – PPO | Admitting: Family Medicine

## 2015-10-13 VITALS — BP 140/70 | HR 93 | Temp 98.8°F | Resp 18 | Ht 72.0 in | Wt 242.2 lb

## 2015-10-13 DIAGNOSIS — R05 Cough: Secondary | ICD-10-CM

## 2015-10-13 DIAGNOSIS — H6123 Impacted cerumen, bilateral: Secondary | ICD-10-CM | POA: Diagnosis not present

## 2015-10-13 DIAGNOSIS — R059 Cough, unspecified: Secondary | ICD-10-CM

## 2015-10-13 DIAGNOSIS — R509 Fever, unspecified: Secondary | ICD-10-CM

## 2015-10-13 MED ORDER — AZITHROMYCIN 250 MG PO TABS: ORAL_TABLET | ORAL | Status: DC

## 2015-10-13 MED ORDER — HYDROCODONE-HOMATROPINE 5-1.5 MG/5ML PO SYRP: 5.0000 mL | ORAL_SOLUTION | Freq: Three times a day (TID) | ORAL | Status: DC | PRN

## 2015-10-13 NOTE — Progress Notes (Signed)
   Subjective:    Patient ID: George Garner, male    DOB: 05-01-1956, 60 y.o.   MRN: [id number] By signing my name below, I, Zola Button, attest that this documentation has been prepared under the direction and in the presence of Robyn Haber, MD.  Electronically Signed: Zola Button, Medical Scribe. 10/13/2015. 8:54 AM.  HPI HPI Comments: George Garner is a 60 y.o. male who presents to the [facility] complaining of gradual onset cough that started 2 days ago. Patient notes he had been ill about 1.5 weeks ago and thought he had fully recovered, but he began feeling ill again 2 days ago. He reports having associated fever, chills, and diaphoresis 2 nights ago. He denies abdominal pain and loss of appetite.  Patient is a Company secretary.  Review of Systems  Constitutional: Positive for fever, chills and diaphoresis. Negative for appetite change.  Respiratory: Positive for cough.   Gastrointestinal: Negative for abdominal pain.       Objective:   Physical Exam CONSTITUTIONAL: Well developed/well nourished HEAD: Normocephalic/atraumatic EYES: EOM/PERRL ENMT: Mucous membranes moist. Throat red. Cerumen impaction bilaterally. NECK: supple no meningeal signs SPINE: entire spine nontender CV: S1/S2 noted, no murmurs/rubs/gallops noted LUNGS: Few basilar rales ABDOMEN: soft, nontender, no rebound or guarding GU: no cva tenderness NEURO: Pt is awake/alert, moves all extremitiesx4 EXTREMITIES: pulses normal, full ROM SKIN: warm, color normal PSYCH: no abnormalities of mood noted        Assessment & Plan:   This chart was scribed in my presence and reviewed by me personally.    ICD-9-CM ICD-10-CM   1. Cough 786.2 R05 azithromycin (ZITHROMAX) 250 MG tablet     HYDROcodone-homatropine (HYCODAN) 5-1.5 MG/5ML syrup  2. Fever and chills 780.60 R50.9 azithromycin (ZITHROMAX) 250 MG tablet     HYDROcodone-homatropine (HYCODAN) 5-1.5 MG/5ML syrup  3. Cerumen impaction, bilateral 380.4  H61.23      Signed, Robyn Haber, MD

## 2015-10-13 NOTE — Patient Instructions (Addendum)

## 2015-10-16 ENCOUNTER — Ambulatory Visit (INDEPENDENT_AMBULATORY_CARE_PROVIDER_SITE_OTHER): Payer: BC Managed Care – PPO | Admitting: Physician Assistant

## 2015-10-16 VITALS — BP 120/70 | HR 76 | Temp 98.7°F | Resp 16 | Ht 72.0 in | Wt 242.0 lb

## 2015-10-16 DIAGNOSIS — R059 Cough, unspecified: Secondary | ICD-10-CM

## 2015-10-16 DIAGNOSIS — R5381 Other malaise: Secondary | ICD-10-CM | POA: Diagnosis not present

## 2015-10-16 DIAGNOSIS — R5383 Other fatigue: Secondary | ICD-10-CM

## 2015-10-16 DIAGNOSIS — R05 Cough: Secondary | ICD-10-CM | POA: Diagnosis not present

## 2015-10-16 DIAGNOSIS — R058 Other specified cough: Secondary | ICD-10-CM

## 2015-10-16 LAB — POCT CBC
GRANULOCYTE PERCENT: 61.9 % (ref 37–80)
HCT, POC: 42.4 % — AB (ref 43.5–53.7)
Hemoglobin: 14.8 g/dL (ref 14.1–18.1)
Lymph, poc: 2.7 (ref 0.6–3.4)
MCH: 30 pg (ref 27–31.2)
MCHC: 34.8 g/dL (ref 31.8–35.4)
MCV: 86 fL (ref 80–97)
MID (CBC): 0.8 (ref 0–0.9)
MPV: 7.5 fL (ref 0–99.8)
PLATELET COUNT, POC: 238 10*3/uL (ref 142–424)
POC Granulocyte: 5.7 (ref 2–6.9)
POC LYMPH PERCENT: 29.7 %L (ref 10–50)
POC MID %: 8.4 % (ref 0–12)
RBC: 4.93 M/uL (ref 4.69–6.13)
RDW, POC: 12.7 %
WBC: 9.2 10*3/uL (ref 4.6–10.2)

## 2015-10-16 LAB — GLUCOSE, POCT (MANUAL RESULT ENTRY): POC GLUCOSE: 86 mg/dL (ref 70–99)

## 2015-10-16 MED ORDER — IPRATROPIUM BROMIDE 0.02 % IN SOLN
0.5000 mg | Freq: Once | RESPIRATORY_TRACT | Status: AC
  Administered 2015-10-16: 0.5 mg via RESPIRATORY_TRACT

## 2015-10-16 MED ORDER — PREDNISONE 20 MG PO TABS: ORAL_TABLET | ORAL | Status: AC

## 2015-10-16 MED ORDER — ALBUTEROL SULFATE (2.5 MG/3ML) 0.083% IN NEBU
2.5000 mg | INHALATION_SOLUTION | Freq: Once | RESPIRATORY_TRACT | Status: AC
  Administered 2015-10-16: 2.5 mg via RESPIRATORY_TRACT

## 2015-10-16 NOTE — Patient Instructions (Signed)
     IF you received an x-ray today, you will receive an invoice from [facility]. Please contact [facility] at [phone] with questions or concerns regarding your invoice.   IF you received labwork today, you will receive an invoice from [facility]. Please contact [facility] at [phone] with questions or concerns regarding your invoice.   Our billing staff will not be able to assist you with questions regarding bills from these companies.  You will be contacted with the lab results as soon as they are available. The fastest way to get your results is to activate your My Chart account. Instructions are located on the last page of this paperwork. If you have not heard from us regarding the results in 2 weeks, please contact this office.      

## 2015-10-16 NOTE — Progress Notes (Signed)
10/16/2015 5:05 PM   DOB: September 09, 1955 / MRN: [id number]  SUBJECTIVE:  George Garner is a 60 y.o. male presenting for a recheck of bronchitis diagnosed by Dr. Joseph Art 3 days ago. He was placed on Z-pak and hycodan is no better.  He is losing sleep due to the cough. He has a 25 pack year history of smoking a quit 15 years ago.    He is allergic to penicillins.   He  has a past medical history of GERD (gastroesophageal reflux disease); Dyslipidemia; Hypogonadism male; OSA on CPAP; Obstructive sleep apnea; Allergy; Hyperlipidemia; Erectile dysfunction; and BPH (benign prostatic hyperplasia).    He  reports that he has quit smoking. He does not have any smokeless tobacco history on file. He reports that he drinks about 0.5 oz of alcohol per week. He reports that he does not use illicit drugs. He  reports that he currently engages in sexual activity. He reports using the following method of birth control/protection: Other-see comments. The patient  has past surgical history that includes Gallbladder surgery; Pilonidal cyst excision; Vasectomy; Cholecystectomy; Eye surgery; and deviated septum-cyst.  His family history includes Arthritis in his brother; Dementia in his mother; Diabetes in his mother.  Review of Systems  Constitutional: Positive for chills and malaise/fatigue. Negative for fever and diaphoresis.  Respiratory: Positive for cough.   Cardiovascular: Negative for chest pain.  Skin: Negative for rash.  Neurological: Negative for weakness and headaches.    Problem list and medications reviewed and updated by myself where necessary, and exist elsewhere in the encounter.   OBJECTIVE:  BP 120/70 mmHg  Pulse 76  Temp(Src) 98.7 F (37.1 C) (Oral)  Resp 16  Ht 6' (1.829 m)  Wt 242 lb (109.77 kg)  BMI 32.81 kg/m2  SpO2 98%  Physical Exam  Constitutional: He is oriented to person, place, and time. He appears well-developed. He does not appear ill.  Eyes: Conjunctivae and EOM are normal.  Pupils are equal, round, and reactive to light.  Cardiovascular: Normal rate, regular rhythm and normal heart sounds.   Pulmonary/Chest: Effort normal and breath sounds normal. No respiratory distress. He has no wheezes. He has no rales.  Abdominal: He exhibits no distension.  Musculoskeletal: Normal range of motion.  Neurological: He is alert and oriented to person, place, and time. No cranial nerve deficit. Coordination normal.  Skin: Skin is warm and dry. He is not diaphoretic.  Psychiatric: He has a normal mood and affect.  Nursing note and vitals reviewed.   Results for orders placed or performed in visit on 10/16/15 (from the past 72 hour(s))  POCT CBC     Status: Abnormal   Collection Time: 10/16/15  4:43 PM  Result Value Ref Range   WBC 9.2 4.6 - 10.2 K/uL   Lymph, poc 2.7 0.6 - 3.4   POC LYMPH PERCENT 29.7 10 - 50 %L   MID (cbc) 0.8 0 - 0.9   POC MID % 8.4 0 - 12 %M   POC Granulocyte 5.7 2 - 6.9   Granulocyte percent 61.9 37 - 80 %G   RBC 4.93 4.69 - 6.13 M/uL   Hemoglobin 14.8 14.1 - 18.1 g/dL   HCT, POC 42.4 (A) 43.5 - 53.7 %   MCV 86.0 80 - 97 fL   MCH, POC 30.0 27 - 31.2 pg   MCHC 34.8 31.8 - 35.4 g/dL   RDW, POC 12.7 %   Platelet Count, POC 238 142 - 424 K/uL   MPV 7.5  0 - 99.8 fL  POCT glucose (manual entry)     Status: None   Collection Time: 10/16/15  4:44 PM  Result Value Ref Range   POC Glucose 86 70 - 99 mg/dl    No results found.  ASSESSMENT AND PLAN  Kit was seen today for follow-up.  Diagnoses and all orders for this visit:  Cough -     POCT CBC -     albuterol (PROVENTIL) (2.5 MG/3ML) 0.083% nebulizer solution 2.5 mg; Take 3 mLs (2.5 mg total) by nebulization once. -     ipratropium (ATROVENT) nebulizer solution 0.5 mg; Take 2.5 mLs (0.5 mg total) by nebulization once. -     POCT glucose (manual entry)  Post-viral cough syndrome: Advised that prednisone may be the only thing that can help.  He is not a diabetic.  His CBC is reassuring from a  bacterial standpoint, although I have nothing to compare to.   -     predniSONE (DELTASONE) 20 MG tablet; Take 3 in the morning for 3 days, then 2 in the morning for 3 days, and then 1 in the morning for 3 days.    The patient was advised to call or return to clinic if he does not see an improvement in symptoms or to seek the care of the closest emergency department if he worsens with the above plan.   Philis Fendt, MHS, PA-C [facility] 10/16/2015 5:05 PM

## 2015-12-17 ENCOUNTER — Ambulatory Visit (INDEPENDENT_AMBULATORY_CARE_PROVIDER_SITE_OTHER): Payer: BC Managed Care – PPO | Admitting: Family Medicine

## 2015-12-17 VITALS — BP 138/80 | HR 69 | Temp 98.4°F | Resp 15 | Ht 72.0 in | Wt 240.0 lb

## 2015-12-17 DIAGNOSIS — J320 Chronic maxillary sinusitis: Secondary | ICD-10-CM

## 2015-12-17 NOTE — Patient Instructions (Addendum)
  Our office will call you within the next couple of days regarding an appointment with an ear, nose and throat specialist    IF you received an x-ray today, you will receive an invoice from [facility]. Please contact [facility] at [phone] with questions or concerns regarding your invoice.   IF you received labwork today, you will receive an invoice from [facility]. Please contact [facility] at [phone] with questions or concerns regarding your invoice.   Our billing staff will not be able to assist you with questions regarding bills from these companies.  You will be contacted with the lab results as soon as they are available. The fastest way to get your results is to activate your My Chart account. Instructions are located on the last page of this paperwork. If you have not heard from [place] regarding the results in 2 weeks, please contact this office.

## 2015-12-17 NOTE — Progress Notes (Signed)
Subjective:    Patient ID: George Garner, male    DOB: 07-09-1956, 60 y.o.   MRN: [id number]  HPI This is a 60 yo male who presents today with 2 days of pain upper right tooth pain. This is the same symptom he had previously and was told he had a severe sinus infection. About 3 weeks ago he had a bad tooth ache. He went to the dentist and was told he had a sinus infection. He was given augmentin for 10 days and his tooth ache resolved. He was given an additional prescription and told to get it filled if needed. He felt better and disposed of prescription. He was on Azithromycin and prednisone 3/17. He reports that his current symptoms do not feel anything like his previous sinus infections. Pain is 4/10, he describes it as "annoying," and has not taken any medication for pain.  Past Medical History  Diagnosis Date  . GERD (gastroesophageal reflux disease)   . Dyslipidemia   . Hypogonadism male   . OSA on CPAP   . Obstructive sleep apnea   . Allergy     Zyrtec daily.  Flonase PRN.  Marland Kitchen Hyperlipidemia   . Erectile dysfunction   . BPH (benign prostatic hyperplasia)    Past Surgical History  Procedure Laterality Date  . Gallbladder surgery    . Pilonidal cyst excision    . Vasectomy    . Cholecystectomy    . Eye surgery      L eye tumor benign.  [place] septum-cyst     Family History  Problem Relation Age of Onset  . Diabetes Mother   . Dementia Mother   . Arthritis Brother    Social History  Substance Use Topics  . Smoking status: Former Research scientist (life sciences)  . Smokeless tobacco: None  . Alcohol Use: 0.5 oz/week    1 Standard drinks or equivalent per week     Review of Systems No fever or chills, no nasal drainage or post nasal drainage, no sore throat, no ear pressure or pain, no cough, no change in energy level, no chest pain or SOB    Objective:   Physical Exam  Constitutional: He is oriented to person, place, and time. He appears well-developed and well-nourished. No distress.    HENT:  Head: Normocephalic and atraumatic.  Right Ear: External ear and ear canal normal. Tympanic membrane is retracted.  Left Ear: External ear and ear canal normal. Tympanic membrane is retracted.  Nose: Nose normal. Right sinus exhibits no maxillary sinus tenderness and no frontal sinus tenderness. Left sinus exhibits no maxillary sinus tenderness and no frontal sinus tenderness.  Mouth/Throat: Uvula is midline and oropharynx is clear and moist.  Small amount erythema upper right gum line.   Eyes: Conjunctivae are normal.  Neck: Normal range of motion. Neck supple.  Cardiovascular: Normal rate, regular rhythm and normal heart sounds.   Pulmonary/Chest: Effort normal and breath sounds normal.  Lymphadenopathy:    He has no cervical adenopathy.  Neurological: He is alert and oriented to person, place, and time.  Skin: Skin is warm and dry. He is not diaphoretic.  Psychiatric: He has a normal mood and affect. His behavior is normal. Judgment and thought content normal.  Vitals reviewed.     BP 138/80 mmHg  Pulse 69  Temp(Src) 98.4 F (36.9 C) (Oral)  Resp 15  Ht 6' (1.829 m)  Wt 240 lb (108.863 kg)  BMI 32.54 kg/m2  SpO2 96% Wt Readings from Last  3 Encounters:  12/17/15 240 lb (108.863 kg)  10/16/15 242 lb (109.77 kg)  10/13/15 242 lb 3.2 oz (109.861 kg)       Assessment & Plan:  1. Chronic maxillary sinusitis - given patient's unusual presentation and two prior course of antibiotics, would prefer to refer him to ENT.  - Ambulatory referral to ENT - RTC if he develops severe headache, jaw pain, fever over 101, visual changes - OTC analgesics prn  Clarene Reamer, FNP-BC  [facility]  12/17/2015 3:59 PM

## 2016-01-13 DIAGNOSIS — J069 Acute upper respiratory infection, unspecified: Secondary | ICD-10-CM | POA: Diagnosis not present

## 2016-01-13 DIAGNOSIS — B9789 Other viral agents as the cause of diseases classified elsewhere: Secondary | ICD-10-CM | POA: Diagnosis not present

## 2016-07-02 ENCOUNTER — Ambulatory Visit (INDEPENDENT_AMBULATORY_CARE_PROVIDER_SITE_OTHER): Payer: BLUE CROSS/BLUE SHIELD | Admitting: Physician Assistant

## 2016-07-02 VITALS — BP 122/74 | HR 90 | Temp 98.1°F | Resp 18 | Ht 72.0 in | Wt 246.8 lb

## 2016-07-02 DIAGNOSIS — J3089 Other allergic rhinitis: Secondary | ICD-10-CM

## 2016-07-02 DIAGNOSIS — Z125 Encounter for screening for malignant neoplasm of prostate: Secondary | ICD-10-CM

## 2016-07-02 DIAGNOSIS — Z13220 Encounter for screening for lipoid disorders: Secondary | ICD-10-CM | POA: Diagnosis not present

## 2016-07-02 DIAGNOSIS — Z130 Encounter for screening for diseases of the blood and blood-forming organs and certain disorders involving the immune mechanism: Secondary | ICD-10-CM | POA: Diagnosis not present

## 2016-07-02 DIAGNOSIS — Z0000 Encounter for general adult medical examination without abnormal findings: Secondary | ICD-10-CM

## 2016-07-02 DIAGNOSIS — Z1329 Encounter for screening for other suspected endocrine disorder: Secondary | ICD-10-CM

## 2016-07-02 DIAGNOSIS — Z23 Encounter for immunization: Secondary | ICD-10-CM

## 2016-07-02 DIAGNOSIS — Z13228 Encounter for screening for other metabolic disorders: Secondary | ICD-10-CM

## 2016-07-02 LAB — COMPLETE METABOLIC PANEL WITH GFR
ALBUMIN: 4.2 g/dL (ref 3.6–5.1)
ALK PHOS: 47 U/L (ref 40–115)
ALT: 33 U/L (ref 9–46)
AST: 30 U/L (ref 10–35)
BILIRUBIN TOTAL: 0.6 mg/dL (ref 0.2–1.2)
BUN: 11 mg/dL (ref 7–25)
CALCIUM: 9.2 mg/dL (ref 8.6–10.3)
CO2: 30 mmol/L (ref 20–31)
CREATININE: 1.13 mg/dL (ref 0.70–1.25)
Chloride: 105 mmol/L (ref 98–110)
GFR, EST AFRICAN AMERICAN: 81 mL/min (ref >=60)
GFR, Est Non African American: 70 mL/min (ref >=60)
Glucose, Bld: 87 mg/dL (ref 65–99)
Potassium: 4.5 mmol/L (ref 3.5–5.3)
Sodium: 142 mmol/L (ref 135–146)
TOTAL PROTEIN: 6.7 g/dL (ref 6.1–8.1)

## 2016-07-02 LAB — CBC
HEMATOCRIT: 43.4 % (ref 38.5–50.0)
Hemoglobin: 14.9 g/dL (ref 13.2–17.1)
MCH: 30.5 pg (ref 27.0–33.0)
MCHC: 34.3 g/dL (ref 32.0–36.0)
MCV: 88.8 fL (ref 80.0–100.0)
MPV: 10.4 fL (ref 7.5–12.5)
Platelets: 192 10*3/uL (ref 140–400)
RBC: 4.89 MIL/uL (ref 4.20–5.80)
RDW: 14 % (ref 11.0–15.0)
WBC: 8.1 10*3/uL (ref 3.8–10.8)

## 2016-07-02 LAB — LIPID PANEL
CHOLESTEROL: 254 mg/dL — AB (ref <200)
HDL: 43 mg/dL (ref >40)
LDL Cholesterol: 170 mg/dL — ABNORMAL HIGH (ref <100)
Total CHOL/HDL Ratio: 5.9 Ratio — ABNORMAL HIGH (ref <5.0)
Triglycerides: 204 mg/dL — ABNORMAL HIGH (ref <150)
VLDL: 41 mg/dL — ABNORMAL HIGH (ref <30)

## 2016-07-02 LAB — TSH: TSH: 1.7 mIU/L (ref 0.40–4.50)

## 2016-07-02 MED ORDER — CETIRIZINE HCL 10 MG PO TABS: 10.0000 mg | ORAL_TABLET | Freq: Every day | ORAL | 11 refills | Status: DC

## 2016-07-02 MED ORDER — FLUTICASONE PROPIONATE 50 MCG/ACT NA SUSP: 2.0000 | Freq: Every day | NASAL | 12 refills | Status: DC

## 2016-07-02 NOTE — Progress Notes (Signed)
[facility] [address], [place] [address] [phone]  Date:  07/02/2016   Name:  George Garner   DOB:  07/20/1956   MRN:  [id number]  PCP:  No PCP Per Patient   Chief Complaint  Patient presents with  . Annual Exam  . Immunizations    Flu    History of Present Illness:  George Garner is a 60 y.o. male patient who presents to [facility] for annual physical exam.   Diet: eating healthy, however eats too much sweets.  Holidays gets difficult with eating more contributing to weight gain.  12-14 cups of water per day.  3-4 diet sodas per week.    BM: normal.  No blood or black stool.  Urination: normal for him, no hematuria, or dysuria.  Sleep: 6.5-7 hours. Not difficult to stay asleep  Social activity: garden, watch sports, theatre.   EtOH: rare Illicit drug use: none Tobacco or vaping: none  Wt Readings from Last 3 Encounters:  07/02/16 246 lb 12.8 oz (111.9 kg)  12/17/15 240 lb (108.9 kg)  10/16/15 242 lb (109.8 kg)     Patient Active Problem List   Diagnosis Date Noted  . Other seasonal allergic rhinitis 05/24/2014  . OSA (obstructive sleep apnea) 05/24/2014  . Plantar wart 05/24/2014  . GERD (gastroesophageal reflux disease)   . Dyslipidemia   . HYPOGODADISM MALE   . OBSTRUCTIVE SLEEP APNEA 11/06/2008  . COUGH 11/06/2008    Past Medical History:  Diagnosis Date  . Allergy    Zyrtec daily.  Flonase PRN.  . BPH (benign prostatic hyperplasia)   . Dyslipidemia   . Erectile dysfunction   . GERD (gastroesophageal reflux disease)   . Hyperlipidemia   . Hypogonadism male   . Obstructive sleep apnea   . OSA on CPAP     Past Surgical History:  Procedure Laterality Date  . CHOLECYSTECTOMY    . deviated septum-cyst    . EYE SURGERY     L eye tumor benign.  Marland Kitchen GALLBLADDER SURGERY    . PILONIDAL CYST EXCISION    . VASECTOMY      Social History  Substance Use Topics  . Smoking status: Former Research scientist (life sciences)  . Smokeless tobacco: Not on file  . Alcohol  use 0.5 oz/week    1 Standard drinks or equivalent per week    Family History  Problem Relation Age of Onset  . Diabetes Mother   . Dementia Mother   . Arthritis Brother     Allergies  Allergen Reactions  . Penicillins Rash    Medication list has been reviewed and updated.  Current Outpatient Prescriptions on File Prior to Visit  Medication Sig Dispense Refill  . aspirin 81 MG tablet Take 81 mg by mouth daily.    . cetirizine (ZYRTEC) 10 MG tablet Take 10 mg by mouth daily.    . Multiple Vitamin (MULTIVITAMIN) tablet Take 1 tablet by mouth daily.     No current facility-administered medications on file prior to visit.     Review of Systems  Constitutional: Negative for chills and fever.  HENT: Negative for ear discharge, ear pain and sore throat.   Eyes: Negative for blurred vision and double vision.  Respiratory: Negative for cough, shortness of breath and wheezing.   Cardiovascular: Negative for chest pain, palpitations and leg swelling.  Gastrointestinal: Negative for diarrhea, nausea and vomiting.  Genitourinary: Negative for dysuria, frequency and hematuria.  Skin: Negative for itching and rash.  Neurological: Negative for dizziness and headaches.     Physical Examination: BP 122/74   Pulse 90   Temp 98.1 F (36.7 C) (Oral)   Resp 18   Ht 6' (1.829 m)   Wt 246 lb 12.8 oz (111.9 kg)   SpO2 95%   BMI 33.47 kg/m  Ideal Body Weight: Weight in (lb) to have BMI = 25: 183.9  Physical Exam   Assessment and Plan: George Garner is a 60 y.o. male who is here today for annual physical exam.   Annual physical exam - Plan: CBC, COMPLETE METABOLIC PANEL WITH GFR, PSA, Lipid panel, TSH, Tdap vaccine greater than or equal to 7yo IM  Need for prophylactic vaccination and inoculation against influenza - Plan: Flu Vaccine QUAD 36+ mos IM  Screening for deficiency anemia - Plan: CBC  Screening for metabolic disorder - Plan: COMPLETE METABOLIC PANEL WITH GFR  Screening  for prostate cancer - Plan: PSA  Screening for lipid disorders - Plan: Lipid panel  Screening for thyroid disorder - Plan: TSH  Need for Tdap vaccination - Plan: Tdap vaccine greater than or equal to 7yo IM  Allergic rhinitis due to other allergic trigger, unspecified chronicity, unspecified seasonality - Plan: fluticasone (FLONASE) 50 MCG/ACT nasal spray, cetirizine (ZYRTEC) 10 MG tablet  Ivar Drape, PA-C [facility] 12/20/20178:06 AM

## 2016-07-02 NOTE — Patient Instructions (Addendum)
Keeping you healthy  Get these tests  Blood pressure- Have your blood pressure checked once a year by your healthcare provider.  Normal blood pressure is 120/80  Weight- Have your body mass index (BMI) calculated to screen for obesity.  BMI is a measure of body fat based on height and weight. You can also calculate your own BMI at [email or web address].  Cholesterol- Have your cholesterol checked every year.  Diabetes- Have your blood sugar checked regularly if you have high blood pressure, high cholesterol, have a family history of diabetes or if you are overweight.  Screening for Colon Cancer- Colonoscopy starting at age 61.  Screening may begin sooner depending on your family history and other health conditions. Follow up colonoscopy as directed by your Gastroenterologist.  Screening for Prostate Cancer- Both blood work (PSA) and a rectal exam help screen for Prostate Cancer.  Screening begins at age 61 with African-American men and at age 11 with Caucasian men.  Screening may begin sooner depending on your family history.  Take these medicines  Aspirin- One aspirin daily can help prevent Heart disease and Stroke.  Flu shot- Every fall.  Tetanus- Every 10 years.  Zostavax- Once after the age of 24 to prevent Shingles.  Pneumonia shot- Once after the age of 61; if you are younger than 52, ask your healthcare provider if you need a Pneumonia shot.  Take these steps  Don't smoke- If you do smoke, talk to your doctor about quitting.  For tips on how to quit, go to www.smokefree.gov or call 1-800-QUIT-NOW.  Be physically active- Exercise 5 days a week for at least 30 minutes.  If you are not already physically active start slow and gradually work up to 30 minutes of moderate physical activity.  Examples of moderate activity include walking briskly, mowing the yard, dancing, swimming, bicycling, etc.  Eat a healthy diet- Eat a variety of healthy food such as fruits, vegetables, low  fat milk, low fat cheese, yogurt, lean meant, poultry, fish, beans, tofu, etc. For more information go to www.thenutritionsource.org  Drink alcohol in moderation- Limit alcohol intake to less than two drinks a day. Never drink and drive.  Dentist- Brush and floss twice daily; visit your dentist twice a year.  Depression- Your emotional health is as important as your physical health. If you're feeling down, or losing interest in things you would normally enjoy please talk to your healthcare provider.  Eye exam- Visit your eye doctor every year.  Safe sex- If you may be exposed to a sexually transmitted infection, use a condom.  Seat belts- Seat belts can save your life; always wear one.  Smoke/Carbon Monoxide detectors- These detectors need to be installed on the appropriate level of your home.  Replace batteries at least once a year.  Skin cancer- When out in the sun, cover up and use sunscreen 15 SPF or higher.  Violence- If anyone is threatening you, please tell your healthcare provider.  Living Will/ Health care power of attorney- Speak with your healthcare provider and family.   IF you received an x-ray today, you will receive an invoice from [facility]. Please contact [facility] at [phone] with questions or concerns regarding your invoice.   IF you received labwork today, you will receive an invoice from [employer]. Please contact [facility] at [phone] with questions or concerns regarding your invoice.   Our billing staff will not be able to assist you with questions regarding bills from these companies.  You  will be contacted with the lab results as soon as they are available. The fastest way to get your results is to activate your My Chart account. Instructions are located on the last page of this paperwork. If you have not heard from [place] regarding the results in 2 weeks, please contact this office.     Tdap Vaccine  (Tetanus, Diphtheria and Pertussis): What You Need to Know 1. Why get vaccinated? Tetanus, diphtheria and pertussis are very serious diseases. Tdap vaccine can protect [place] from these diseases. And, Tdap vaccine given to pregnant women can protect newborn babies against pertussis. TETANUS (Lockjaw) is rare in the [place] today. It causes painful muscle tightening and stiffness, usually all over the body.  It can lead to tightening of muscles in the head and neck so you can't open your mouth, swallow, or sometimes even breathe. Tetanus kills about 1 out of 10 people who are infected even after receiving the best medical care. DIPHTHERIA is also rare in the [place] today. It can cause a thick coating to form in the back of the throat.  It can lead to breathing problems, heart failure, paralysis, and death. PERTUSSIS (Whooping Cough) causes severe coughing spells, which can cause difficulty breathing, vomiting and disturbed sleep.  It can also lead to weight loss, incontinence, and rib fractures. Up to 2 in 100 adolescents and 5 in 100 adults with pertussis are hospitalized or have complications, which could include pneumonia or death. These diseases are caused by bacteria. Diphtheria and pertussis are spread from person to person through secretions from coughing or sneezing. Tetanus enters the body through cuts, scratches, or wounds. Before vaccines, as many as 200,000 cases of diphtheria, 200,000 cases of pertussis, and hundreds of cases of tetanus, were reported in the [place] each year. Since vaccination began, reports of cases for tetanus and diphtheria have dropped by about 99% and for pertussis by about 80%. 2. Tdap vaccine Tdap vaccine can protect adolescents and adults from tetanus, diphtheria, and pertussis. One dose of Tdap is routinely given at age 6 or 43. People who did not get Tdap at that age should get it as soon as possible. Tdap is especially important for healthcare  professionals and anyone having close contact with a baby younger than 12 months. Pregnant women should get a dose of Tdap during every pregnancy, to protect the newborn from pertussis. Infants are most at risk for severe, life-threatening complications from pertussis. Another vaccine, called Td, protects against tetanus and diphtheria, but not pertussis. A Td booster should be given every 10 years. Tdap may be given as one of these boosters if you have never gotten Tdap before. Tdap may also be given after a severe cut or burn to prevent tetanus infection. Your doctor or the person giving you the vaccine can give you more information. Tdap may safely be given at the same time as other vaccines. 3. Some people should not get this vaccine  A person who has ever had a life-threatening allergic reaction after a previous dose of any diphtheria, tetanus or pertussis containing vaccine, OR has a severe allergy to any part of this vaccine, should not get Tdap vaccine. Tell the person giving the vaccine about any severe allergies.  Anyone who had coma or long repeated seizures within 7 days after a childhood dose of DTP or DTaP, or a previous dose of Tdap, should not get Tdap, unless a cause other than the vaccine was found. They can still get  Td.  Talk to your doctor if you:  have seizures or another nervous system problem,  had severe pain or swelling after any vaccine containing diphtheria, tetanus or pertussis,  ever had a condition called Guillain-Barr Syndrome (GBS),  aren't feeling well on the day the shot is scheduled. 4. Risks With any medicine, including vaccines, there is a chance of side effects. These are usually mild and go away on their own. Serious reactions are also possible but are rare. Most people who get Tdap vaccine do not have any problems with it. Mild problems following Tdap: (Did not interfere with activities)  Pain where the shot was given (about 3 in 4 adolescents or 2  in 3 adults)  Redness or swelling where the shot was given (about 1 person in 5)  Mild fever of at least 100.47F (up to about 1 in 25 adolescents or 1 in 100 adults)  Headache (about 3 or 4 people in 10)  Tiredness (about 1 person in 3 or 4)  Nausea, vomiting, diarrhea, stomach ache (up to 1 in 4 adolescents or 1 in 10 adults)  Chills, sore joints (about 1 person in 10)  Body aches (about 1 person in 3 or 4)  Rash, swollen glands (uncommon) Moderate problems following Tdap: (Interfered with activities, but did not require medical attention)  Pain where the shot was given (up to 1 in 5 or 6)  Redness or swelling where the shot was given (up to about 1 in 16 adolescents or 1 in 12 adults)  Fever over 102F (about 1 in 100 adolescents or 1 in 250 adults)  Headache (about 1 in 7 adolescents or 1 in 10 adults)  Nausea, vomiting, diarrhea, stomach ache (up to 1 or 3 people in 100)  Swelling of the entire arm where the shot was given (up to about 1 in 500). Severe problems following Tdap: (Unable to perform usual activities; required medical attention)  Swelling, severe pain, bleeding and redness in the arm where the shot was given (rare). Problems that could happen after any vaccine:  People sometimes faint after a medical procedure, including vaccination. Sitting or lying down for about 15 minutes can help prevent fainting, and injuries caused by a fall. Tell your doctor if you feel dizzy, or have vision changes or ringing in the ears.  Some people get severe pain in the shoulder and have difficulty moving the arm where a shot was given. This happens very rarely.  Any medication can cause a severe allergic reaction. Such reactions from a vaccine are very rare, estimated at fewer than 1 in a million doses, and would happen within a few minutes to a few hours after the vaccination. As with any medicine, there is a very remote chance of a vaccine causing a serious injury or  death. The safety of vaccines is always being monitored. For more information, visit: http://www.aguilar.org/ 5. What if there is a serious problem? What should I look for? Look for anything that concerns you, such as signs of a severe allergic reaction, very high fever, or unusual behavior. Signs of a severe allergic reaction can include hives, swelling of the face and throat, difficulty breathing, a fast heartbeat, dizziness, and weakness. These would usually start a few minutes to a few hours after the vaccination. What should I do?  If you think it is a severe allergic reaction or other emergency that can't wait, call 9-1-1 or get the person to the nearest hospital. Otherwise, call your doctor.  Afterward, the reaction should be reported to the Vaccine Adverse Event Reporting System (VAERS). Your doctor might file this report, or you can do it yourself through the VAERS web site at www.vaers.SamedayNews.es, or by calling 463-264-7704.  VAERS does not give medical advice. 6. The National Vaccine Injury Compensation Program The [employer] Vaccine Injury Compensation Program (VICP) is a federal program that was created to compensate people who may have been injured by certain vaccines. Persons who believe they may have been injured by a vaccine can learn about the program and about filing a claim by calling 754-067-5686 or visiting the [place] website at GoldCloset.com.ee. There is a time limit to file a claim for compensation. 7. How can I learn more?  Ask your doctor. He or she can give you the vaccine package insert or suggest other sources of information.  Call your local or state health department.  Contact the Centers for Disease Control and Prevention (CDC):  Call 425-671-5945 (1-800-CDC-INFO) or  Visit CDC's website at http://hunter.com/ CDC Tdap Vaccine VIS (09/25/13) This information is not intended to replace advice given to you by your health care provider. Make  sure you discuss any questions you have with your health care provider. Document Released: 01/18/2012 Document Revised: 04/08/2016 Document Reviewed: 04/08/2016 Elsevier Interactive Patient Education  2017 [place]. Influenza (Flu) Vaccine (Inactivated or Recombinant): What You Need to Know 1. Why get vaccinated? Influenza ("flu") is a contagious disease that spreads around the [place] every year, usually between October and May. Flu is caused by influenza viruses, and is spread mainly by coughing, sneezing, and close contact. Anyone can get flu. Flu strikes suddenly and can last several days. Symptoms vary by age, but can include:  fever/chills  sore throat  muscle aches  fatigue  cough  headache  runny or stuffy nose Flu can also lead to pneumonia and blood infections, and cause diarrhea and seizures in children. If you have a medical condition, such as heart or lung disease, flu can make it worse. Flu is more dangerous for some people. Infants and young children, people 14 years of age and older, pregnant women, and people with certain health conditions or a weakened immune system are at greatest risk. Each year thousands of people in the [place] die from flu, and many more are hospitalized. Flu vaccine can:  keep you from getting flu,  make flu less severe if you do get it, and  keep you from spreading flu to your family and other people. 2. Inactivated and recombinant flu vaccines A dose of flu vaccine is recommended every flu season. Children 6 months through 25 years of age may need two doses during the same flu season. Everyone else needs only one dose each flu season. Some inactivated flu vaccines contain a very small amount of a mercury-based preservative called thimerosal. Studies have not shown thimerosal in vaccines to be harmful, but flu vaccines that do not contain thimerosal are available. There is no live flu virus in flu shots. They cannot cause  the flu. There are many flu viruses, and they are always changing. Each year a new flu vaccine is made to protect against three or four viruses that are likely to cause disease in the upcoming flu season. But even when the vaccine doesn't exactly match these viruses, it may still provide some protection. Flu vaccine cannot prevent:  flu that is caused by a virus not covered by the vaccine, or  illnesses that look like flu but are  not. It takes about 2 weeks for protection to develop after vaccination, and protection lasts through the flu season. 3. Some people should not get this vaccine Tell the person who is giving you the vaccine:  If you have any severe, life-threatening allergies. If you ever had a life-threatening allergic reaction after a dose of flu vaccine, or have a severe allergy to any part of this vaccine, you may be advised not to get vaccinated. Most, but not all, types of flu vaccine contain a small amount of egg protein.  If you ever had Guillain-Barr Syndrome (also called GBS). Some people with a history of GBS should not get this vaccine. This should be discussed with your doctor.  If you are not feeling well. It is usually okay to get flu vaccine when you have a mild illness, but you might be asked to come back when you feel better. 4. Risks of a vaccine reaction With any medicine, including vaccines, there is a chance of reactions. These are usually mild and go away on their own, but serious reactions are also possible. Most people who get a flu shot do not have any problems with it. Minor problems following a flu shot include:  soreness, redness, or swelling where the shot was given  hoarseness  sore, red or itchy eyes  cough  fever  aches  headache  itching  fatigue If these problems occur, they usually begin soon after the shot and last 1 or 2 days. More serious problems following a flu shot can include the following:  There may be a small increased  risk of Guillain-Barre Syndrome (GBS) after inactivated flu vaccine. This risk has been estimated at 1 or 2 additional cases per million people vaccinated. This is much lower than the risk of severe complications from flu, which can be prevented by flu vaccine.  Young children who get the flu shot along with pneumococcal vaccine (PCV13) and/or DTaP vaccine at the same time might be slightly more likely to have a seizure caused by fever. Ask your doctor for more information. Tell your doctor if a child who is getting flu vaccine has ever had a seizure. Problems that could happen after any injected vaccine:  People sometimes faint after a medical procedure, including vaccination. Sitting or lying down for about 15 minutes can help prevent fainting, and injuries caused by a fall. Tell your doctor if you feel dizzy, or have vision changes or ringing in the ears.  Some people get severe pain in the shoulder and have difficulty moving the arm where a shot was given. This happens very rarely.  Any medication can cause a severe allergic reaction. Such reactions from a vaccine are very rare, estimated at about 1 in a million doses, and would happen within a few minutes to a few hours after the vaccination. As with any medicine, there is a very remote chance of a vaccine causing a serious injury or death. The safety of vaccines is always being monitored. For more information, visit: http://www.aguilar.org/ 5. What if there is a serious reaction? What should I look for? Look for anything that concerns you, such as signs of a severe allergic reaction, very high fever, or unusual behavior. Signs of a severe allergic reaction can include hives, swelling of the face and throat, difficulty breathing, a fast heartbeat, dizziness, and weakness. These would start a few minutes to a few hours after the vaccination. What should I do?  If you think it is a severe allergic  reaction or other emergency that can't wait,  call 9-1-1 and get the person to the nearest hospital. Otherwise, call your doctor.  Reactions should be reported to the Vaccine Adverse Event Reporting System (VAERS). Your doctor should file this report, or you can do it yourself through the VAERS web site at www.vaers.SamedayNews.es, or by calling [phone].  VAERS does not give medical advice. 6. The National Vaccine Injury Compensation Program The [employer] Vaccine Injury Compensation Program (VICP) is a federal program that was created to compensate people who may have been injured by certain vaccines. Persons who believe they may have been injured by a vaccine can learn about the program and about filing a claim by calling 215-708-8202 or visiting the [place] website at GoldCloset.com.ee. There is a time limit to file a claim for compensation. 7. How can I learn more?  Ask your healthcare provider. He or she can give you the vaccine package insert or suggest other sources of information.  Call your local or state health department.  Contact the Centers for Disease Control and Prevention (CDC):  Call 631-738-2572 (1-800-CDC-INFO) or  Visit CDC's website at https://gibson.com/ Vaccine Information Statement, Inactivated Influenza Vaccine (03/08/2014) This information is not intended to replace advice given to you by your health care provider. Make sure you discuss any questions you have with your health care provider. Document Released: 05/13/2006 Document Revised: 04/08/2016 Document Reviewed: 04/08/2016 Elsevier Interactive Patient Education  2017 [employer].

## 2016-07-03 LAB — PSA: PSA: 3.2 ng/mL (ref <=4.0)

## 2016-07-16 ENCOUNTER — Ambulatory Visit (INDEPENDENT_AMBULATORY_CARE_PROVIDER_SITE_OTHER): Payer: BLUE CROSS/BLUE SHIELD | Admitting: Physician Assistant

## 2016-07-16 VITALS — BP 126/80 | HR 83 | Temp 98.1°F | Resp 17 | Ht 72.5 in | Wt 244.0 lb

## 2016-07-16 DIAGNOSIS — J3489 Other specified disorders of nose and nasal sinuses: Secondary | ICD-10-CM | POA: Diagnosis not present

## 2016-07-16 DIAGNOSIS — Z8709 Personal history of other diseases of the respiratory system: Secondary | ICD-10-CM | POA: Diagnosis not present

## 2016-07-16 MED ORDER — PREDNISONE 20 MG PO TABS: ORAL_TABLET | ORAL | 0 refills | Status: DC

## 2016-07-16 NOTE — Patient Instructions (Addendum)
ENT will call you to schedule an appointment. Please call or return to clinic if your symptoms worsen or if you have questions before your appointment.  Thank you for coming in today. I hope you feel we met your needs.  Feel free to call [facility] if you have any questions or further requests.  Please consider signing up for MyChart if you do not already have it, as this is a great way to communicate with me.  Best,  Whitney McVey, PA-C   IF you received an x-ray today, you will receive an invoice from [facility]. Please contact [facility] at [phone] with questions or concerns regarding your invoice.   IF you received labwork today, you will receive an invoice from [facility]. Please contact [facility] at [phone] with questions or concerns regarding your invoice.   Our billing staff will not be able to assist you with questions regarding bills from these companies.  You will be contacted with the lab results as soon as they are available. The fastest way to get your results is to activate your My Chart account. Instructions are located on the last page of this paperwork. If you have not heard from [place] regarding the results in 2 weeks, please contact this office.

## 2016-07-16 NOTE — Progress Notes (Signed)
George Garner  MRN: [id number] DOB: [birth year]  PCP: No PCP Per Patient  Subjective:  Pt is a pleasant 60 year old male who presents to clinic for nasal congestion.  He cannot breathe from his left nostril, no issues with his right. He has been taking Zyrtec daily and Flonase 2 puffs each nostril every day. Not helping. This has been going on for about 2-3 weeks. He is having a difficulty time breathing while sleeping and his wife states his snoring has gotten worse. Used to be on CPAP, he lost weight and does not use it any more.  Denies fever, chills, sinus pressure, headache, nose bleeds, cough, drainage from eyes, ear pain.   History of deviated septum - Surgery for deviated septum in 2005 (ish). Cannot recall who performed his surgery or exact year. Symptoms were absent the next 5 or so years. However symptoms returned after a while and have gradually worsened.  He reports blocked left nasal passage at least once a year. Is not related to time of year/seaon changes. This seems to be happening more frequently and is lasting for longer periods of time.   Denies nose bleeds,  Review of Systems  Constitutional: Negative for chills, diaphoresis and fever.  HENT: Positive for congestion. Negative for nosebleeds, postnasal drip, rhinorrhea, sinus pain, sinus pressure, sneezing and sore throat.   Respiratory: Negative for cough, chest tightness, shortness of breath and wheezing.   Cardiovascular: Negative for chest pain and palpitations.  Gastrointestinal: Negative for abdominal pain, nausea and vomiting.  Neurological: Negative for dizziness, light-headedness and headaches.  Psychiatric/Behavioral: Positive for sleep disturbance.    Patient Active Problem List   Diagnosis Date Noted  . OSA on CPAP 05/24/2014    Priority: Low  . Other seasonal allergic rhinitis 05/24/2014  . GERD (gastroesophageal reflux disease)   . Dyslipidemia   . HYPOGODADISM MALE     Current Outpatient Prescriptions  on File Prior to Visit  Medication Sig Dispense Refill  . aspirin 81 MG tablet Take 81 mg by mouth daily.    . cetirizine (ZYRTEC) 10 MG tablet Take 1 tablet (10 mg total) by mouth daily. 30 tablet 11  . fluticasone (FLONASE) 50 MCG/ACT nasal spray Place 2 sprays into both nostrils daily. 16 g 12  . Multiple Vitamin (MULTIVITAMIN) tablet Take 1 tablet by mouth daily.     No current facility-administered medications on file prior to visit.     Allergies  Allergen Reactions  . Penicillins Rash     Objective:  BP 126/80 (BP Location: Right Arm, Patient Position: Sitting, Cuff Size: Normal)   Pulse 83   Temp 98.1 F (36.7 C) (Oral)   Resp 17   Ht 6' 0.5" (1.842 m)   Wt 244 lb (110.7 kg)   SpO2 97%   BMI 32.64 kg/m   Physical Exam  Constitutional: He is oriented to person, place, and time and well-developed, well-nourished, and in no distress. No distress.  HENT:  Right Ear: Tympanic membrane normal.  Left Ear: Tympanic membrane normal.  Nose: Right sinus exhibits no maxillary sinus tenderness and no frontal sinus tenderness. Left sinus exhibits no maxillary sinus tenderness and no frontal sinus tenderness.  Mouth/Throat: Oropharynx is clear and moist and mucous membranes are normal.  Right medial nasal wall inflamed, edematous turbinates. Left turbinates mucosal edema, no erythema, no inflammation. No drainage.  Right nares patent. Left nares patent to inhalation only. Patient is unable to breathe out left nostril.   Cardiovascular: Normal rate,  regular rhythm and normal heart sounds.   Pulmonary/Chest: Effort normal. No respiratory distress.  Neurological: He is alert and oriented to person, place, and time. GCS score is 15.  Skin: Skin is warm and dry.  Psychiatric: Mood, memory, affect and judgment normal.  Vitals reviewed.   Assessment and Plan :  1. History of nasal obstruction 2. History of deviated nasal septum 3. Nasal obstruction - predniSONE (DELTASONE) 20 MG  tablet; Take 3 PO QAM x3days, 2 PO QAM x3days, 1 PO QAM x3days  Dispense: 18 tablet; Refill: 0 - Ambulatory referral to ENT for further work-up.  - RTC if symptoms worsen   Mercer Pod, PA-C  [facility] 07/16/2016 9:56 AM

## 2016-07-29 ENCOUNTER — Encounter: Payer: Self-pay | Admitting: *Deleted

## 2016-08-03 ENCOUNTER — Ambulatory Visit (INDEPENDENT_AMBULATORY_CARE_PROVIDER_SITE_OTHER): Payer: BLUE CROSS/BLUE SHIELD | Admitting: Family Medicine

## 2016-08-03 VITALS — BP 146/66 | HR 113 | Temp 98.9°F | Resp 20 | Ht 77.0 in | Wt 243.0 lb

## 2016-08-03 DIAGNOSIS — J189 Pneumonia, unspecified organism: Secondary | ICD-10-CM | POA: Diagnosis not present

## 2016-08-03 MED ORDER — BENZONATATE 200 MG PO CAPS: 200.0000 mg | ORAL_CAPSULE | Freq: Three times a day (TID) | ORAL | 0 refills | Status: DC | PRN

## 2016-08-03 MED ORDER — AZITHROMYCIN 250 MG PO TABS: ORAL_TABLET | ORAL | 0 refills | Status: DC

## 2016-08-03 MED ORDER — ALBUTEROL SULFATE (2.5 MG/3ML) 0.083% IN NEBU
2.5000 mg | INHALATION_SOLUTION | Freq: Once | RESPIRATORY_TRACT | Status: AC
  Administered 2016-08-03: 2.5 mg via RESPIRATORY_TRACT

## 2016-08-03 NOTE — Progress Notes (Signed)
Subjective:  By signing my name below, I, Essence Howell, attest that this documentation has been prepared under the direction and in the presence of Delman Cheadle, MD Electronically Signed: Ladene Artist, ED Scribe 08/03/2016 at 9:43 AM.   Patient ID: George Garner, male    DOB: 11-Feb-1956, 61 y.o.   MRN: [id number]  Chief Complaint  Patient presents with  . Cough    x 1 wk  . Sore Throat    x 1 wk  . Sinusitis    x 1wk  . Generalized Body Aches    night sweats   HPI HPI Comments: George Garner is a 61 y.o. male who presents to the [facility]. Pt was seen in the office 2 weeks ago for similar symptoms and prescribed Zyrtec, Flonase, Prednisone and referred to ENT. Pt uses a c-pap at night. Pt states that symptoms resolved after first dose of Prednisone. Today, he presents with a gradually worsening dry cough for the past 2 weeks, which is improved with lying. He reports associated symptoms of fatigue, postnasal drip, mild sore throat, nausea, generalized myalgias and low-grade fevers over the past 3 days. Pt has tried DayQuil with temporary relief. He denies nasal congestion, ear pain, sinus pressure, rhinorrhea, abdominal pain, vomiting. Pt is a former smoker; quit 20 years ago.   Past Medical History:  Diagnosis Date  . Allergy    Zyrtec daily.  Flonase PRN.  . BPH (benign prostatic hyperplasia)   . Dyslipidemia   . Erectile dysfunction   . GERD (gastroesophageal reflux disease)   . Hyperlipidemia   . Hypogonadism male   . Obstructive sleep apnea   . OSA on CPAP    Current Outpatient Prescriptions on File Prior to Visit  Medication Sig Dispense Refill  . aspirin 81 MG tablet Take 81 mg by mouth daily.    . cetirizine (ZYRTEC) 10 MG tablet Take 1 tablet (10 mg total) by mouth daily. 30 tablet 11  . fluticasone (FLONASE) 50 MCG/ACT nasal spray Place 2 sprays into both nostrils daily. 16 g 12  . Multiple Vitamin (MULTIVITAMIN) tablet Take 1 tablet by mouth daily.    .  predniSONE (DELTASONE) 20 MG tablet Take 3 PO QAM x3days, 2 PO QAM x3days, 1 PO QAM x3days (Patient not taking: Reported on 08/03/2016) 18 tablet 0   No current facility-administered medications on file prior to visit.    Allergies  Allergen Reactions  . Penicillins Rash   Review of Systems  Constitutional: Positive for fatigue and fever.  HENT: Positive for postnasal drip and sore throat. Negative for congestion, ear pain, rhinorrhea and sinus pressure.   Respiratory: Positive for cough.   Gastrointestinal: Positive for nausea. Negative for abdominal pain and vomiting.  Musculoskeletal: Positive for myalgias.      Objective:   Physical Exam  Constitutional: He is oriented to person, place, and time. He appears well-developed and well-nourished. No distress.  HENT:  Head: Normocephalic and atraumatic.  Right Ear: Tympanic membrane normal.  Left Ear: Tympanic membrane normal.  Nose: Nose normal.  Mouth/Throat: Posterior oropharyngeal erythema present.  Eyes: Conjunctivae and EOM are normal.  Neck: Neck supple. No tracheal deviation present. No thyromegaly present.  Cardiovascular: Regular rhythm, S1 normal, S2 normal and normal heart sounds.  Tachycardia present.   Pulmonary/Chest: Effort normal. No respiratory distress. He has rales.  Good air movement. Rales in L lower lobe; improved with deep breathing but do not resolve. Repeat exam after nebulizer: lungs clear.  Musculoskeletal: Normal range of motion.  Lymphadenopathy:       Head (right side): Tonsillar adenopathy present.       Head (left side): Tonsillar adenopathy present.  Neurological: He is alert and oriented to person, place, and time.  Skin: Skin is warm and dry.  Psychiatric: He has a normal mood and affect. His behavior is normal.  Nursing note and vitals reviewed.  BP (!) 146/66   Pulse (!) 113   Temp 98.9 F (37.2 C) (Oral)   Resp 20   Ht 6\' 5"  (1.956 m)   Wt 243 lb (110.2 kg)   SpO2 97%   BMI 28.82  kg/m     Assessment & Plan:   1. Walking pneumonia    -  the patient developed what sounds like a likely viral URI when he was coming off the prednisone so likely still immunosuppressed. He now has a secondary - or tertiary - illness concerning for possible bacterial infection with diaphoresis and increased fatigue so will cover for bacterial bronchitis versus atypical pneumonia versus community-acquired pneumonia with Z-Pak. Reviewed aggressive cough suppression as we'll likely end up with post bronchitic cough for several weeks. Can try Tessalon versus OTC Delsym. Call if unsuccessful. I suspect his left lower lobe Rales were due to atelectasis since resolved after albuterol neb. However patient did not feel symptomatically any different with no decreased cough so no need to continue bronchodilator.  Meds ordered this encounter  Medications  . albuterol (PROVENTIL) (2.5 MG/3ML) 0.083% nebulizer solution 2.5 mg  . azithromycin (ZITHROMAX) 250 MG tablet    Sig: Take 2 tabs PO x 1 dose, then 1 tab PO QD x 4 days    Dispense:  6 tablet    Refill:  0  . benzonatate (TESSALON) 200 MG capsule    Sig: Take 1 capsule (200 mg total) by mouth 3 (three) times daily as needed for cough.    Dispense:  40 capsule    Refill:  0    I personally performed the services described in this documentation, which was scribed in my presence. The recorded information has been reviewed and considered, and addended by me as needed.   Delman Cheadle, M.D.  [facility] [address] [place], [place] [address] [phone] phone [phone] fax  08/03/16 10:28 AM

## 2016-08-03 NOTE — Patient Instructions (Addendum)
If you are not starting to feel better within 3 days or at any point are feeling worse please come back in immed for further evaluation.  The antibiotic will last in your system for up to 12 days - a full week after it is completed. It is common that after several illnesses like you have had the cough will often persist for another 2-3 weeks beyond the point when you're feeling well. I would expect within a week that you should be feeling much better without the nausea, body aches, and low-grade fevers within a week. However you will likely continue to cough some just enough to annoy those around you for the next month so plan to keep the Tessalon or otc Delsym on board to suppress your cough and avoid the resulting chest, throat, and head irritation from freq cough throughout the month. I'm glad you're cough improves when you lay down. If the above recommendations for cough suppression do not suffice, please call.    IF you received an x-ray today, you will receive an invoice from [facility]. Please contact [facility] at [phone] with questions or concerns regarding your invoice.   IF you received labwork today, you will receive an invoice from [facility]. Please contact [facility] at [phone] with questions or concerns regarding your invoice.   Our billing staff will not be able to assist you with questions regarding bills from these companies.  You will be contacted with the lab results as soon as they are available. The fastest way to get your results is to activate your My Chart account. Instructions are located on the last page of this paperwork. If you have not heard from [place] regarding the results in 2 weeks, please contact this office.      Community-Acquired Pneumonia, Adult Pneumonia is an infection of the lungs. There are different types of pneumonia. One type can develop while a person is in a hospital. A different type, called community-acquired pneumonia, develops  in people who are not, or have not recently been, in the hospital or other health care facility. What are the causes? Pneumonia may be caused by bacteria, viruses, or funguses. Community-acquired pneumonia is often caused by Streptococcus pneumonia bacteria. These bacteria are often passed from one person to another by breathing in droplets from the cough or sneeze of an infected person. What increases the risk? The condition is more likely to develop in:  People who havechronic diseases, such as chronic obstructive pulmonary disease (COPD), asthma, congestive heart failure, cystic fibrosis, diabetes, or kidney disease.  People who haveearly-stage or late-stage HIV.  People who havesickle cell disease.  People who havehad their spleen removed (splenectomy).  People who havepoor Human resources officer.  People who havemedical conditions that increase the risk of breathing in (aspirating) secretions their own mouth and nose.  People who havea weakened immune system (immunocompromised).  People who smoke.  People whotravel to areas where pneumonia-causing germs commonly exist.  People whoare around animal habitats or animals that have pneumonia-causing germs, including birds, bats, rabbits, cats, and farm animals. What are the signs or symptoms? Symptoms of this condition include:  Adry cough.  A wet (productive) cough.  Fever.  Sweating.  Chest pain, especially when breathing deeply or coughing.  Rapid breathing or difficulty breathing.  Shortness of breath.  Shaking chills.  Fatigue.  Muscle aches. How is this diagnosed? Your health care provider will take a medical history and perform a physical exam. You may also have other tests, including:  Imaging studies of your chest, including X-rays.  Tests to check your blood oxygen level and other blood gases.  Other tests on blood, mucus (sputum), fluid around your lungs (pleural fluid), and urine. If your pneumonia  is severe, other tests may be done to identify the specific cause of your illness. How is this treated? The type of treatment that you receive depends on many factors, such as the cause of your pneumonia, the medicines you take, and other medical conditions that you have. For most adults, treatment and recovery from pneumonia may occur at home. In some cases, treatment must happen in a hospital. Treatment may include:  Antibiotic medicines, if the pneumonia was caused by bacteria.  Antiviral medicines, if the pneumonia was caused by a virus.  Medicines that are given by mouth or through an IV tube.  Oxygen.  Respiratory therapy. Although rare, treating severe pneumonia may include:  Mechanical ventilation. This is done if you are not breathing well on your own and you cannot maintain a safe blood oxygen level.  Thoracentesis. This procedureremoves fluid around one lung or both lungs to help you breathe better. Follow these instructions at home:  Take over-the-counter and prescription medicines only as told by your health care provider.  Only takecough medicine if you are losing sleep. Understand that cough medicine can prevent your body's natural ability to remove mucus from your lungs.  If you were prescribed an antibiotic medicine, take it as told by your health care provider. Do not stop taking the antibiotic even if you start to feel better.  Sleep in a semi-upright position at night. Try sleeping in a reclining chair, or place a few pillows under your head.  Do not use tobacco products, including cigarettes, chewing tobacco, and e-cigarettes. If you need help quitting, ask your health care provider.  Drink enough water to keep your urine clear or pale yellow. This will help to thin out mucus secretions in your lungs. How is this prevented? There are ways that you can decrease your risk of developing community-acquired pneumonia. Consider getting a pneumococcal vaccine  if:  You are older than 62 years of age.  You are older than 62 years of age and are undergoing cancer treatment, have chronic lung disease, or have other medical conditions that affect your immune system. Ask your health care provider if this applies to you. There are different types and schedules of pneumococcal vaccines. Ask your health care provider which vaccination option is best for you. You may also prevent community-acquired pneumonia if you take these actions:  Get an influenza vaccine every year. Ask your health care provider which type of influenza vaccine is best for you.  Go to the dentist on a regular basis.  Wash your hands often. Use hand sanitizer if soap and water are not available. Contact a health care provider if:  You have a fever.  You are losing sleep because you cannot control your cough with cough medicine. Get help right away if:  You have worsening shortness of breath.  You have increased chest pain.  Your sickness becomes worse, especially if you are an older adult or have a weakened immune system.  You cough up blood. This information is not intended to replace advice given to you by your health care provider. Make sure you discuss any questions you have with your health care provider. Document Released: 07/19/2005 Document Revised: 11/27/2015 Document Reviewed: 11/13/2014 Elsevier Interactive Patient Education  2017 [employer].

## 2016-12-08 ENCOUNTER — Telehealth: Payer: Self-pay | Admitting: General Practice

## 2016-12-08 NOTE — Telephone Encounter (Signed)
Pt is needing a refill for nasal spray and the zyrtec rx -he states that was using the brand over the counter but believes that an rx would be cheaper   Best number [phone]

## 2016-12-10 MED ORDER — FLUTICASONE PROPIONATE 50 MCG/ACT NA SUSP: 2.0000 | Freq: Every day | NASAL | 1 refills | Status: AC

## 2016-12-10 MED ORDER — CETIRIZINE HCL 10 MG PO TABS: 10.0000 mg | ORAL_TABLET | Freq: Every day | ORAL | 1 refills | Status: AC

## 2017-02-09 ENCOUNTER — Ambulatory Visit (INDEPENDENT_AMBULATORY_CARE_PROVIDER_SITE_OTHER): Payer: BLUE CROSS/BLUE SHIELD | Admitting: Emergency Medicine

## 2017-02-09 ENCOUNTER — Encounter: Payer: Self-pay | Admitting: Emergency Medicine

## 2017-02-09 VITALS — BP 144/78 | HR 75 | Temp 97.5°F | Resp 17 | Ht 77.0 in | Wt 244.0 lb

## 2017-02-09 DIAGNOSIS — M25562 Pain in left knee: Secondary | ICD-10-CM | POA: Diagnosis not present

## 2017-02-09 DIAGNOSIS — R059 Cough, unspecified: Secondary | ICD-10-CM

## 2017-02-09 DIAGNOSIS — G8929 Other chronic pain: Secondary | ICD-10-CM | POA: Diagnosis not present

## 2017-02-09 DIAGNOSIS — J069 Acute upper respiratory infection, unspecified: Secondary | ICD-10-CM | POA: Diagnosis not present

## 2017-02-09 DIAGNOSIS — R05 Cough: Secondary | ICD-10-CM | POA: Diagnosis not present

## 2017-02-09 MED ORDER — PROMETHAZINE-CODEINE 6.25-10 MG/5ML PO SYRP: 5.0000 mL | ORAL_SOLUTION | Freq: Every evening | ORAL | 0 refills | Status: DC | PRN

## 2017-02-09 NOTE — Patient Instructions (Addendum)
     IF you received an x-ray today, you will receive an invoice from [facility]. Please contact [facility] at [phone] with questions or concerns regarding your invoice.   IF you received labwork today, you will receive an invoice from [facility]. Please contact [facility] at [phone] with questions or concerns regarding your invoice.   Our billing staff will not be able to assist you with questions regarding bills from these companies.  You will be contacted with the lab results as soon as they are available. The fastest way to get your results is to activate your My Chart account. Instructions are located on the last page of this paperwork. If you have not heard from us regarding the results in 2 weeks, please contact this office.     Upper Respiratory Infection, Adult Most upper respiratory infections (URIs) are caused by a virus. A URI affects the nose, throat, and upper air passages. The most common type of URI is often called "the common cold." Follow these instructions at home:  Take medicines only as told by your doctor.  Gargle warm saltwater or take cough drops to comfort your throat as told by your doctor.  Use a warm mist humidifier or inhale steam from a shower to increase air moisture. This may make it easier to breathe.  Drink enough fluid to keep your pee (urine) clear or pale yellow.  Eat soups and other clear broths.  Have a healthy diet.  Rest as needed.  Go back to work when your fever is gone or your doctor says it is okay. ? You may need to stay home longer to avoid giving your URI to others. ? You can also wear a face mask and wash your hands often to prevent spread of the virus.  Use your inhaler more if you have asthma.  Do not use any tobacco products, including cigarettes, chewing tobacco, or electronic cigarettes. If you need help quitting, ask your doctor. Contact a doctor if:  You are getting worse, not better.  Your  symptoms are not helped by medicine.  You have chills.  You are getting more short of breath.  You have brown or red mucus.  You have yellow or brown discharge from your nose.  You have pain in your face, especially when you bend forward.  You have a fever.  You have puffy (swollen) neck glands.  You have pain while swallowing.  You have white areas in the back of your throat. Get help right away if:  You have very bad or constant: ? Headache. ? Ear pain. ? Pain in your forehead, behind your eyes, and over your cheekbones (sinus pain). ? Chest pain.  You have long-lasting (chronic) lung disease and any of the following: ? Wheezing. ? Long-lasting cough. ? Coughing up blood. ? A change in your usual mucus.  You have a stiff neck.  You have changes in your: ? Vision. ? Hearing. ? Thinking. ? Mood. This information is not intended to replace advice given to you by your health care provider. Make sure you discuss any questions you have with your health care provider. Document Released: 01/05/2008 Document Revised: 03/21/2016 Document Reviewed: 10/24/2013 Elsevier Interactive Patient Education  2018 [employer]  

## 2017-02-09 NOTE — Progress Notes (Signed)
George Garner 61 y.o.   Chief Complaint  Patient presents with  . Cough  . URI    HISTORY OF PRESENT ILLNESS: This is a 61 y.o. male complaining of dry cough.  Cough  This is a new problem. The current episode started in the past 7 days. The problem has been gradually worsening. Episode frequency: worse at night. The cough is non-productive. Associated symptoms include nasal congestion. Pertinent negatives include no chest pain, chills, ear pain, fever, headaches, hemoptysis, myalgias, rash, rhinorrhea, sore throat, shortness of breath, weight loss or wheezing. The symptoms are aggravated by lying down. He has tried OTC cough suppressant for the symptoms. There is no history of asthma, bronchiectasis, bronchitis, COPD, emphysema or pneumonia.  URI   Associated symptoms include coughing. Pertinent negatives include no abdominal pain, chest pain, diarrhea, ear pain, headaches, nausea, rash, rhinorrhea, sore throat, vomiting or wheezing.     Prior to Admission medications   Medication Sig Start Date End Date Taking? Authorizing Provider  aspirin 81 MG tablet Take 81 mg by mouth daily.   Yes [provider]  cetirizine (ZYRTEC) 10 MG tablet Take 1 tablet (10 mg total) by mouth daily. 12/10/16  Yes McVey, Gelene Mink, PA-C  fluticasone (FLONASE) 50 MCG/ACT nasal spray Place 2 sprays into both nostrils daily. 12/10/16  Yes McVey, Gelene Mink, PA-C  Multiple Vitamin (MULTIVITAMIN) tablet Take 1 tablet by mouth daily.   Yes [provider]  promethazine-codeine (PHENERGAN WITH CODEINE) 6.25-10 MG/5ML syrup Take 5 mLs by mouth at bedtime as needed for cough. 02/09/17   Horald Pollen, MD    Allergies  Allergen Reactions  . Penicillins Rash    Patient Active Problem List   Diagnosis Date Noted  . Acute URI 02/09/2017  . Chronic pain of left knee 02/09/2017  . Other seasonal allergic rhinitis 05/24/2014  . OSA on CPAP 05/24/2014  . GERD (gastroesophageal  reflux disease)   . Dyslipidemia   . HYPOGODADISM MALE   . Cough 11/06/2008    Past Medical History:  Diagnosis Date  . Allergy    Zyrtec daily.  Flonase PRN.  . BPH (benign prostatic hyperplasia)   . Dyslipidemia   . Erectile dysfunction   . GERD (gastroesophageal reflux disease)   . Hyperlipidemia   . Hypogonadism male   . Obstructive sleep apnea   . OSA on CPAP     Past Surgical History:  Procedure Laterality Date  . CHOLECYSTECTOMY    . deviated septum-cyst    . EYE SURGERY     L eye tumor benign.  Marland Kitchen GALLBLADDER SURGERY    . PILONIDAL CYST EXCISION    . VASECTOMY      Social History   Social History  . Marital status: Married    Spouse name: N/A  . Number of children: N/A  . Years of education: N/A   Occupational History  . 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 Smoking status: Former Research scientist (life sciences)  . Smokeless tobacco: Never Used  . Alcohol use 0.5 oz/week    1 Standard drinks or equivalent per week  . Drug use: No  . Sexual activity: Yes    Birth control/ protection: Other-see comments     Comment: vasectomy   Other Topics Concern  . Not on file   Social History Narrative   Marital status: married x 26 years.      Children: 3 children (23, 21, 16); no grandchildren.  Lives: with wife, 1 child at home       Employment: [employer] x 13 years.      Tobacco: quit smoking 15 years ago.      Alcohol: rarely      Exercise:  3 times per week (weights, cardio).      Seatbelt: 100%      Guns: none    Family History  Problem Relation Age of Onset  . Diabetes Mother   . Dementia Mother   . Arthritis Brother      Review of Systems  Constitutional: Negative.  Negative for chills, fever and weight loss.  HENT: Negative.  Negative for ear pain, rhinorrhea and sore throat.   Eyes: Negative.   Respiratory: Positive for cough. Negative for hemoptysis, shortness of breath and wheezing.   Cardiovascular:  Negative for chest pain and palpitations.  Gastrointestinal: Negative.  Negative for abdominal pain, diarrhea, nausea and vomiting.  Genitourinary: Negative.   Musculoskeletal: Negative for myalgias.  Skin: Negative.  Negative for rash.  Neurological: Negative for dizziness and headaches.  Endo/Heme/Allergies: Negative.   All other systems reviewed and are negative.  Vitals:   02/09/17 1507  BP: (!) 144/78  Pulse: 75  Resp: 17  Temp: (!) 97.5 F (36.4 C)     Physical Exam  Constitutional: He is oriented to person, place, and time. He appears well-developed and well-nourished.  HENT:  Head: Normocephalic.  Right Ear: External ear normal.  Left Ear: External ear normal.  Nose: Nose normal.  Mouth/Throat: Oropharynx is clear and moist. No oropharyngeal exudate.  Eyes: Conjunctivae and EOM are normal. Pupils are equal, round, and reactive to light.  Neck: Normal range of motion. Neck supple. No JVD present. No thyromegaly present.  Cardiovascular: Normal rate, regular rhythm, normal heart sounds and intact distal pulses.   Pulmonary/Chest: Effort normal and breath sounds normal.  Abdominal: Soft. Bowel sounds are normal. He exhibits no distension. There is no tenderness.  Musculoskeletal: Normal range of motion.  Lymphadenopathy:    He has no cervical adenopathy.  Neurological: He is alert and oriented to person, place, and time. No sensory deficit. He exhibits normal muscle tone.  Skin: Skin is warm and dry. Capillary refill takes less than 2 seconds.  Psychiatric: He has a normal mood and affect. His behavior is normal.  Vitals reviewed.    ASSESSMENT & PLAN: George Garner was seen today for cough and uri.  Diagnoses and all orders for this visit:  Acute URI  Cough -     promethazine-codeine (PHENERGAN WITH CODEINE) 6.25-10 MG/5ML syrup; Take 5 mLs by mouth at bedtime as needed for cough.  Chronic pain of left knee -     Ambulatory referral to Orthopedic  Surgery    Patient Instructions       IF you received an x-ray today, you will receive an invoice from [facility]. Please contact [facility] at [phone] with questions or concerns regarding your invoice.   IF you received labwork today, you will receive an invoice from [facility]. Please contact [facility] at [phone] with questions or concerns regarding your invoice.   Our billing staff will not be able to assist you with questions regarding bills from these companies.  You will be contacted with the lab results as soon as they are available. The fastest way to get your results is to activate your My Chart account. Instructions are located on the last page of this paperwork. If you have not heard from [place]  regarding the results in 2 weeks, please contact this office.      Upper Respiratory Infection, Adult Most upper respiratory infections (URIs) are caused by a virus. A URI affects the nose, throat, and upper air passages. The most common type of URI is often called "the common cold." Follow these instructions at home:  Take medicines only as told by your doctor.  Gargle warm saltwater or take cough drops to comfort your throat as told by your doctor.  Use a warm mist humidifier or inhale steam from a shower to increase air moisture. This may make it easier to breathe.  Drink enough fluid to keep your pee (urine) clear or pale yellow.  Eat soups and other clear broths.  Have a healthy diet.  Rest as needed.  Go back to work when your fever is gone or your doctor says it is okay. ? You may need to stay home longer to avoid giving your URI to others. ? You can also wear a face mask and wash your hands often to prevent spread of the virus.  Use your inhaler more if you have asthma.  Do not use any tobacco products, including cigarettes, chewing tobacco, or electronic cigarettes. If you need help quitting, ask your doctor. Contact a doctor if:  You  are getting worse, not better.  Your symptoms are not helped by medicine.  You have chills.  You are getting more short of breath.  You have brown or red mucus.  You have yellow or brown discharge from your nose.  You have pain in your face, especially when you bend forward.  You have a fever.  You have puffy (swollen) neck glands.  You have pain while swallowing.  You have white areas in the back of your throat. Get help right away if:  You have very bad or constant: ? Headache. ? Ear pain. ? Pain in your forehead, behind your eyes, and over your cheekbones (sinus pain). ? Chest pain.  You have long-lasting (chronic) lung disease and any of the following: ? Wheezing. ? Long-lasting cough. ? Coughing up blood. ? A change in your usual mucus.  You have a stiff neck.  You have changes in your: ? Vision. ? Hearing. ? Thinking. ? Mood. This information is not intended to replace advice given to you by your health care provider. Make sure you discuss any questions you have with your health care provider. Document Released: 01/05/2008 Document Revised: 03/21/2016 Document Reviewed: 10/24/2013 Elsevier Interactive Patient Education  2018 Elsevier Inc.      Agustina Caroli, MD [facility]

## 2017-02-19 ENCOUNTER — Encounter: Payer: Self-pay | Admitting: Emergency Medicine

## 2017-03-25 ENCOUNTER — Ambulatory Visit (INDEPENDENT_AMBULATORY_CARE_PROVIDER_SITE_OTHER): Payer: Self-pay

## 2017-03-25 ENCOUNTER — Ambulatory Visit (INDEPENDENT_AMBULATORY_CARE_PROVIDER_SITE_OTHER): Payer: BLUE CROSS/BLUE SHIELD | Admitting: Orthopaedic Surgery

## 2017-03-25 ENCOUNTER — Encounter (INDEPENDENT_AMBULATORY_CARE_PROVIDER_SITE_OTHER): Payer: Self-pay | Admitting: Orthopaedic Surgery

## 2017-03-25 VITALS — BP 130/75 | HR 65 | Ht 72.0 in | Wt 240.0 lb

## 2017-03-25 DIAGNOSIS — M25562 Pain in left knee: Secondary | ICD-10-CM

## 2017-03-25 DIAGNOSIS — G8929 Other chronic pain: Secondary | ICD-10-CM | POA: Diagnosis not present

## 2017-03-25 NOTE — Progress Notes (Signed)
Office Visit Note   Patient: George Garner           Date of Birth: May 09, 1956           MRN: [id number] Visit Date: 03/25/2017              Requested by: Horald Pollen, MD [place], [place] [address] PCP: Patient, No Pcp Per   Assessment & Plan: Visit Diagnoses:  1. Chronic pain of left knee   Left knee pain could be consistent with with some early osteoarthritis in the medial compartment versus chronic meniscal tear. Also has mild symptoms of chondromalacia patella  Plan: Long discussion regarding different treatment options including cortisone injections, MRI scan. He presently seems to be doing well with  a knee support. ,Tylenol and Motrin. We will see back as needed. No further questions  Follow-Up Instructions: Return if symptoms worsen or fail to improve.   Orders:  Orders Placed This Encounter  Procedures  . XR KNEE 3 VIEW LEFT   No orders of the defined types were placed in this encounter.     Procedures: No procedures performed   Clinical Data: No additional findings.   Subjective: Chief Complaint  Patient presents with  . Left Knee - Pain    George Garner is a 61 y o that presents with chronic Left knee pain. Denies injury, pain ongoing since 14 months ago. Limited ROM.  George Garner relates that he's had some chronic problems with his left knee on an episodic basis. He was told that he had a small tear of the medial meniscus many years ago that simply "healed on its own". On occasion she's had some recurrent pain without history of injury or trauma. His knee does not give way. He is not aware that he's had any swelling or skin changes. He's had some difficulty keeping his knee "bent" for a length of time so she will a bit of stiffness. Denies any thigh or back pain or calf discomfort. Also denies fever chills shortness of breath or chest pain. On this occasion he had insidious onset of pain several weeks ago for "no apparent reason". He's been taking Tylenol  number morning and Motrin at night and has done well. He was looking for further guidance in terms of the diagnosis and what he might expect over time  HPI  Review of Systems  Constitutional: Negative for fatigue.  HENT: Positive for hearing loss.   Respiratory: Negative for apnea, chest tightness and shortness of breath.   Cardiovascular: Negative for chest pain, palpitations and leg swelling.  Gastrointestinal: Negative for blood in stool, constipation and diarrhea.  Genitourinary: Positive for urgency. Negative for difficulty urinating.  Musculoskeletal: Negative for arthralgias, back pain, joint swelling, myalgias, neck pain and neck stiffness.  Neurological: Negative for weakness, numbness and headaches.  Hematological: Does not bruise/bleed easily.  Psychiatric/Behavioral: Negative for sleep disturbance. The patient is not nervous/anxious.      Objective: Vital Signs: BP 130/75   Pulse 65   Ht 6' (1.829 m)   Wt 240 lb (108.9 kg)   BMI 32.55 kg/m   Physical Exam  Ortho Exam left knee without effusion. Very minimal medial joint pain. Mild patellar crepitation but no pain along the medial or lateral patellar facets. Full knee extension and flexion over 110 without instability. No popliteal mass or pain. No calf pain. Neurovascular exam intact. Skin intact. Walks without a limp. Straight leg raise negative bilaterally. No hip discomfort. Awake alert  and oriented 3 Specialty Comments:  No specialty comments available.  Imaging: Xr Knee 3 View Left  Result Date: 03/25/2017 Films of the left knee were obtained 3 projections standing. There is no evidence of ectopic calcification. Joint spaces appear to be well maintained with normal alignment. Femoral joint reveals some irregularity along the lateral patella facet with peripheral osteophytes and possibly an early osteochondral lesion. Findings could be consistent with mild osteoarthritis more and is involving the patellofemoral  joint laterally    PMFS History: Patient Active Problem List   Diagnosis Date Noted  . Acute URI 02/09/2017  . Chronic pain of left knee 02/09/2017  . Other seasonal allergic rhinitis 05/24/2014  . OSA on CPAP 05/24/2014  . GERD (gastroesophageal reflux disease)   . Dyslipidemia   . HYPOGODADISM MALE   . Cough 11/06/2008   Past Medical History:  Diagnosis Date  . Allergy    Zyrtec daily.  Flonase PRN.  . BPH (benign prostatic hyperplasia)   . Dyslipidemia   . Erectile dysfunction   . GERD (gastroesophageal reflux disease)   . Hyperlipidemia   . Hypogonadism male   . Obstructive sleep apnea   . OSA on CPAP     Family History  Problem Relation Age of Onset  . Diabetes Mother   . Dementia Mother   . Arthritis Brother     Past Surgical History:  Procedure Laterality Date  . CHOLECYSTECTOMY    . deviated septum-cyst    . EYE SURGERY     L eye tumor benign.  Marland Kitchen GALLBLADDER SURGERY    . PILONIDAL CYST EXCISION    . 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 Smoking status: Former Research scientist (life sciences)  . Smokeless tobacco: Never Used  . Alcohol use 0.5 oz/week    1 Standard drinks or equivalent per week  . Drug use: No  . Sexual activity: Yes    Birth control/ protection: Other-see comments     Comment: vasectomy

## 2017-04-01 DIAGNOSIS — H903 Sensorineural hearing loss, bilateral: Secondary | ICD-10-CM | POA: Diagnosis not present

## 2017-04-15 DIAGNOSIS — H52223 Regular astigmatism, bilateral: Secondary | ICD-10-CM | POA: Diagnosis not present

## 2017-04-15 DIAGNOSIS — H524 Presbyopia: Secondary | ICD-10-CM | POA: Diagnosis not present

## 2017-04-15 DIAGNOSIS — H5203 Hypermetropia, bilateral: Secondary | ICD-10-CM | POA: Diagnosis not present

## 2017-08-12 DIAGNOSIS — Z23 Encounter for immunization: Secondary | ICD-10-CM | POA: Diagnosis not present

## 2017-08-12 DIAGNOSIS — H6122 Impacted cerumen, left ear: Secondary | ICD-10-CM | POA: Diagnosis not present

## 2017-08-26 ENCOUNTER — Ambulatory Visit (INDEPENDENT_AMBULATORY_CARE_PROVIDER_SITE_OTHER): Payer: BLUE CROSS/BLUE SHIELD | Admitting: Family Medicine

## 2017-08-26 ENCOUNTER — Encounter: Payer: Self-pay | Admitting: Family Medicine

## 2017-08-26 VITALS — BP 120/76 | HR 79 | Temp 98.8°F | Resp 16 | Ht 72.75 in | Wt 242.0 lb

## 2017-08-26 DIAGNOSIS — Z125 Encounter for screening for malignant neoplasm of prostate: Secondary | ICD-10-CM

## 2017-08-26 DIAGNOSIS — E785 Hyperlipidemia, unspecified: Secondary | ICD-10-CM

## 2017-08-26 DIAGNOSIS — G4733 Obstructive sleep apnea (adult) (pediatric): Secondary | ICD-10-CM | POA: Diagnosis not present

## 2017-08-26 DIAGNOSIS — Z23 Encounter for immunization: Secondary | ICD-10-CM | POA: Diagnosis not present

## 2017-08-26 DIAGNOSIS — J309 Allergic rhinitis, unspecified: Secondary | ICD-10-CM

## 2017-08-26 DIAGNOSIS — Z0000 Encounter for general adult medical examination without abnormal findings: Secondary | ICD-10-CM

## 2017-08-26 MED ORDER — ZOSTER VAC RECOMB ADJUVANTED 50 MCG/0.5ML IM SUSR: 0.5000 mL | Freq: Once | INTRAMUSCULAR | 1 refills | Status: AC

## 2017-08-26 NOTE — Progress Notes (Signed)
Subjective:    Patient ID: George Garner, male    DOB: 06-05-1956, 62 y.o.   MRN: [id number]  HPI George Garner is a 62 y.o. male Presents today for: Chief Complaint  Patient presents with  . Annual Exam    Patient states he will be picking up hearing aids tomorrow   Referring exam, history of GERD, hyperlipidemia, BPH, OSA on CPAP, allergies. Currently takes Flonase and Zyrtec for allergies  Cancer screening Colonoscopy May 2009. Has not received call yet.  Prostate cancer screening - history of elevated PSA/BPH. Evaluated by [facility], then [facility] in 2016. DRE reassuring and PSA normal for age at that time. 2-3 episodes nocturia per night for years. Has tried to decrease fluids at bedtime.   Lab Results  Component Value Date   PSA 3.2 07/02/2016   PSA 4.35 (H) 06/23/2015   PSA 4.06 (H) 05/13/2014   Immunizations Immunization History  Administered Date(s) Administered  . Influenza Whole 04/29/2011  . Influenza,inj,Quad PF,6+ Mos 05/13/2014, 06/23/2015, 07/02/2016  . Tdap 06/08/2006, 07/02/2016  . Zoster 08/03/2011   Depression screening Depression screen [facility] 2/9 08/26/2017 02/09/2017 08/03/2016 07/16/2016 12/17/2015  Decreased Interest 0 0 0 0 0  Down, Depressed, Hopeless 0 0 0 0 0  PHQ - 2 Score 0 0 0 0 0    Vision screening  Visual Acuity Screening   Right eye Left eye Both eyes  Without correction:     With correction: 20/13 20/15 -1 20/13  has seen optho within a year and a half  Exercise No regular exercise now, but walks when weather is better.   Dentist -every 4 months.   Hyperlipidemia Lab Results  Component Value Date   CHOL 254 (H) 07/02/2016   HDL 43 07/02/2016   LDLCALC 170 (H) 07/02/2016   TRIG 204 (H) 07/02/2016   CHOLHDL 5.9 (H) 07/02/2016   Prior hx of OSA on CPAP.  Lost weight. No further snoring, daytime somnolence.   Patient Active Problem List   Diagnosis Date Noted  . Acute URI 02/09/2017  . Chronic pain of left knee  02/09/2017  . Other seasonal allergic rhinitis 05/24/2014  . OSA on CPAP 05/24/2014  . GERD (gastroesophageal reflux disease)   . Dyslipidemia   . HYPOGODADISM MALE   . Cough 11/06/2008   Past Medical History:  Diagnosis Date  . Allergy    Zyrtec daily.  Flonase PRN.  . BPH (benign prostatic hyperplasia)   . Dyslipidemia   . Erectile dysfunction   . GERD (gastroesophageal reflux disease)   . Hyperlipidemia   . Hypogonadism male   . Obstructive sleep apnea   . OSA on CPAP    Past Surgical History:  Procedure Laterality Date  . CHOLECYSTECTOMY    . deviated septum-cyst    . EYE SURGERY     L eye tumor benign.  [facility] Kitchen GALLBLADDER SURGERY    . PILONIDAL CYST EXCISION    . VASECTOMY     Allergies  Allergen Reactions  . Penicillins Rash   Prior to Admission medications   Medication Sig Start Date End Date Taking? Authorizing Provider  aspirin 81 MG tablet Take 81 mg by mouth daily.   Yes [provider]  cetirizine (ZYRTEC) 10 MG tablet Take 1 tablet (10 mg total) by mouth daily. 12/10/16  Yes McVey, Gelene Mink, PA-C  fluticasone (FLONASE) 50 MCG/ACT nasal spray Place 2 sprays into both nostrils daily. 12/10/16  Yes McVey, Gelene Mink, PA-C  Multiple Vitamin (MULTIVITAMIN) tablet  Take 1 tablet by mouth daily.   Yes [provider]   Social History   Socioeconomic History  . Marital status: Married    Spouse name: Not on file  . Number of children: Not on file  . Years of education: Not on file  . Highest education level: Not on file  Social Needs  . Financial resource strain: Not on file  . Food insecurity - worry: Not on file  . Food insecurity - inability: Not on file  . Transportation needs - medical: Not on file  . Transportation needs - non-medical: Not on file  Occupational History  . Occupation: Best boy: [employer]  Tobacco Use  . Smoking status: Former Research scientist (life sciences)  . Smokeless tobacco: Never Used    Substance and Sexual Activity  . Alcohol use: Yes    Alcohol/week: 0.5 oz    Types: 1 Standard drinks or equivalent per week  . Drug use: No  . Sexual activity: Yes    Birth control/protection: Other-see comments    Comment: vasectomy  Other Topics Concern  . Not on file  Social History Narrative   Marital status: married x 26 years.      Children: 3 children (23, 21, 16); no grandchildren.      Lives: with wife, 1 child at home       Employment: [employer] x 13 years.      Tobacco: quit smoking 15 years ago.      Alcohol: rarely      Exercise:  3 times per week (weights, cardio).      Seatbelt: 100%      Guns: none    Review of Systems  HENT: Positive for mouth sores (hx of canker sores - resolve on own. ).   Genitourinary: Positive for frequency (longstanding. increased fluids during day. 2-3 episodes nocturia. ). Negative for decreased urine volume and hematuria.  Musculoskeletal: Positive for arthralgias (L knee pain, seen ortho. ).  Skin: Positive for rash (tinea versicolor-longstanding. ).  Allergic/Immunologic: Positive for environmental allergies.   13 point ROS otherwise negative.     Objective:   Physical Exam  Constitutional: He is oriented to person, place, and time. He appears well-developed and well-nourished.  HENT:  Head: Normocephalic and atraumatic.  Right Ear: External ear normal.  Left Ear: External ear normal.  Mouth/Throat: Oropharynx is clear and moist.  Eyes: Conjunctivae and EOM are normal. Pupils are equal, round, and reactive to light.  Neck: Normal range of motion. Neck supple. No thyromegaly present.  Cardiovascular: Normal rate, regular rhythm, normal heart sounds and intact distal pulses.  Pulmonary/Chest: Effort normal and breath sounds normal. No respiratory distress. He has no wheezes.  Abdominal: Soft. He exhibits no distension. There is no tenderness. Hernia confirmed negative in the right inguinal area and confirmed  negative in the left inguinal area.  Genitourinary: Prostate normal.  Musculoskeletal: Normal range of motion. He exhibits no edema or tenderness.  Lymphadenopathy:    He has no cervical adenopathy.  Neurological: He is alert and oriented to person, place, and time. He has normal reflexes.  Skin: Skin is warm and dry.  Psychiatric: He has a normal mood and affect. His behavior is normal.  Vitals reviewed.  Vitals:   08/26/17 0819  BP: 120/76  Pulse: 79  Resp: 16  Temp: 98.8 F (37.1 C)  TempSrc: Oral  SpO2: 95%  Weight: 242 lb (109.8 kg)  Height: 6' 0.75" (1.848  m)       Assessment & Plan:   George Garner is a 62 y.o. male Annual physical exam  - -anticipatory guidance as below in AVS, screening labs above. Health maintenance items as above in HPI discussed/recommended as applicable.   Need for shingles vaccine - Plan: Zoster Vaccine Adjuvanted [facility]) injection  - Sent to pharmacy  Screening for prostate cancer - Plan: PSA  - We discussed pros and cons of prostate cancer screening, and after this discussion, he chose to have screening done. PSA obtained, and no concerning findings on DRE.   Allergic rhinitis, unspecified seasonality, unspecified trigger  - On Zyrtec and Flonase. Controlled.  OSA (obstructive sleep apnea) - Plan: Ambulatory referral to Sleep Studies  -Refer back to sleep studies to determine if CPAP indicated. May not need as prior weight loss, but does admit to some recent weight gain. Exercise discussed as component of health maintenance.  Hyperlipidemia, unspecified hyperlipidemia type - Plan: Comprehensive metabolic panel, Lipid panel  -Check lipids, CMP. Depending on ASCVD risk, may recommend statin  Other items discussed with review of systems including rash on chest which appears to be tinea versicolor. These have all been overall stable/chronic issues, advised to follow-up to discuss these in further detail. Would consider treatment of tinea  versicolor during spring/summer if worsens.  Meds ordered this encounter  Medications  . Zoster Vaccine Adjuvanted [facility]) injection    Sig: Inject 0.5 mLs into the muscle once for 1 dose. Repeat in 2-6 months.    Dispense:  0.5 mL    Refill:  1   Patient Instructions   For rash on chest, can treat with meds if needed - best to treat that in spring or summer when it is worse.   Can treat wart if needed - please return if that area is more bothersome.   I will refer you for repeat sleep study to see if you need to be back on CPAP.   Depending on cholesterol readings, may recommend statin. Okay to work on diet/exercise for now.  Keeping you healthy  Get these tests  Blood pressure- Have your blood pressure checked once a year by your healthcare provider.  Normal blood pressure is 120/80  Weight- Have your body mass index (BMI) calculated to screen for obesity.  BMI is a measure of body fat based on height and weight. You can also calculate your own BMI at [email or web address].  Cholesterol- Have your cholesterol checked every year.  Diabetes- Have your blood sugar checked regularly if you have high blood pressure, high cholesterol, have a family history of diabetes or if you are overweight.  Screening for Colon Cancer- Colonoscopy starting at age 14.  Screening may begin sooner depending on your family history and other health conditions. Follow up colonoscopy as directed by your Gastroenterologist.  Screening for Prostate Cancer- Both blood work (PSA) and a rectal exam help screen for Prostate Cancer.  Screening begins at age 8 with African-American men and at age 36 with Caucasian men.  Screening may begin sooner depending on your family history.  Take these medicines  Aspirin- One aspirin daily can help prevent Heart disease and Stroke.  Flu shot- Every fall.  Tetanus- Every 10 years.  Zostavax- Once after the age of 63 to prevent Shingles.  Pneumonia shot- Once  after the age of 65; if you are younger than 32, ask your healthcare provider if you need a Pneumonia shot.  Take these steps  Don't smoke- If you do  smoke, talk to your doctor about quitting.  For tips on how to quit, go to www.smokefree.gov or call 1-800-QUIT-NOW.  Be physically active- Exercise 5 days a week for at least 30 minutes.  If you are not already physically active start slow and gradually work up to 30 minutes of moderate physical activity.  Examples of moderate activity include walking briskly, mowing the yard, dancing, swimming, bicycling, etc.  Eat a healthy diet- Eat a variety of healthy food such as fruits, vegetables, low fat milk, low fat cheese, yogurt, lean meant, poultry, fish, beans, tofu, etc. For more information go to www.thenutritionsource.org  Drink alcohol in moderation- Limit alcohol intake to less than two drinks a day. Never drink and drive.  Dentist- Brush and floss twice daily; visit your dentist twice a year.  Depression- Your emotional health is as important as your physical health. If you're feeling down, or losing interest in things you would normally enjoy please talk to your healthcare provider.  Eye exam- Visit your eye doctor every year.  Safe sex- If you may be exposed to a sexually transmitted infection, use a condom.  Seat belts- Seat belts can save your life; always wear one.  Smoke/Carbon Monoxide detectors- These detectors need to be installed on the appropriate level of your home.  Replace batteries at least once a year.  Skin cancer- When out in the sun, cover up and use sunscreen 15 SPF or higher.  Violence- If anyone is threatening you, please tell your healthcare provider.  Living Will/ Health care power of attorney- Speak with your healthcare provider and family.  IF you received an x-ray today, you will receive an invoice from [name]. Please contact [facility] at [phone] with questions or concerns  regarding your invoice.   IF you received labwork today, you will receive an invoice from [place]. Please contact [facility] at [phone] with questions or concerns regarding your invoice.   Our billing staff will not be able to assist you with questions regarding bills from these companies.  You will be contacted with the lab results as soon as they are available. The fastest way to get your results is to activate your My Chart account. Instructions are located on the last page of this paperwork. If you have not heard from [place] regarding the results in 2 weeks, please contact this office.     Signed,   Merri Ray, MD [facility].  08/27/17 12:10 PM

## 2017-08-26 NOTE — Patient Instructions (Addendum)
For rash on chest, can treat with meds if needed - best to treat that in spring or summer when it is worse.   Can treat wart if needed - please return if that area is more bothersome.   I will refer you for repeat sleep study to see if you need to be back on CPAP.   Depending on cholesterol readings, may recommend statin. Okay to work on diet/exercise for now.  Keeping you healthy  Get these tests  Blood pressure- Have your blood pressure checked once a year by your healthcare provider.  Normal blood pressure is 120/80  Weight- Have your body mass index (BMI) calculated to screen for obesity.  BMI is a measure of body fat based on height and weight. You can also calculate your own BMI at [email or web address].  Cholesterol- Have your cholesterol checked every year.  Diabetes- Have your blood sugar checked regularly if you have high blood pressure, high cholesterol, have a family history of diabetes or if you are overweight.  Screening for Colon Cancer- Colonoscopy starting at age 42.  Screening may begin sooner depending on your family history and other health conditions. Follow up colonoscopy as directed by your Gastroenterologist.  Screening for Prostate Cancer- Both blood work (PSA) and a rectal exam help screen for Prostate Cancer.  Screening begins at age 74 with African-American men and at age 75 with Caucasian men.  Screening may begin sooner depending on your family history.  Take these medicines  Aspirin- One aspirin daily can help prevent Heart disease and Stroke.  Flu shot- Every fall.  Tetanus- Every 10 years.  Zostavax- Once after the age of 12 to prevent Shingles.  Pneumonia shot- Once after the age of 73; if you are younger than 52, ask your healthcare provider if you need a Pneumonia shot.  Take these steps  Don't smoke- If you do smoke, talk to your doctor about quitting.  For tips on how to quit, go to www.smokefree.gov or call 1-800-QUIT-NOW.  Be  physically active- Exercise 5 days a week for at least 30 minutes.  If you are not already physically active start slow and gradually work up to 30 minutes of moderate physical activity.  Examples of moderate activity include walking briskly, mowing the yard, dancing, swimming, bicycling, etc.  Eat a healthy diet- Eat a variety of healthy food such as fruits, vegetables, low fat milk, low fat cheese, yogurt, lean meant, poultry, fish, beans, tofu, etc. For more information go to www.thenutritionsource.org  Drink alcohol in moderation- Limit alcohol intake to less than two drinks a day. Never drink and drive.  Dentist- Brush and floss twice daily; visit your dentist twice a year.  Depression- Your emotional health is as important as your physical health. If you're feeling down, or losing interest in things you would normally enjoy please talk to your healthcare provider.  Eye exam- Visit your eye doctor every year.  Safe sex- If you may be exposed to a sexually transmitted infection, use a condom.  Seat belts- Seat belts can save your life; always wear one.  Smoke/Carbon Monoxide detectors- These detectors need to be installed on the appropriate level of your home.  Replace batteries at least once a year.  Skin cancer- When out in the sun, cover up and use sunscreen 15 SPF or higher.  Violence- If anyone is threatening you, please tell your healthcare provider.  Living Will/ Health care power of attorney- Speak with your healthcare provider and family.  IF you  received an x-ray today, you will receive an invoice from [facility]. Please contact [facility] at [phone] with questions or concerns regarding your invoice.   IF you received labwork today, you will receive an invoice from [place]. Please contact [facility] at [phone] with questions or concerns regarding your invoice.   Our billing staff will not be able to assist you with questions regarding bills from  these companies.  You will be contacted with the lab results as soon as they are available. The fastest way to get your results is to activate your My Chart account. Instructions are located on the last page of this paperwork. If you have not heard from [place] regarding the results in 2 weeks, please contact this office.

## 2017-08-27 ENCOUNTER — Encounter: Payer: Self-pay | Admitting: Family Medicine

## 2017-08-27 LAB — COMPREHENSIVE METABOLIC PANEL
ALT: 25 IU/L (ref 0–44)
AST: 21 IU/L (ref 0–40)
Albumin/Globulin Ratio: 1.7 (ref 1.2–2.2)
Albumin: 4.5 g/dL (ref 3.6–4.8)
Alkaline Phosphatase: 64 IU/L (ref 39–117)
BUN/Creatinine Ratio: 16 (ref 10–24)
BUN: 20 mg/dL (ref 8–27)
Bilirubin Total: 0.5 mg/dL (ref 0.0–1.2)
CALCIUM: 9.8 mg/dL (ref 8.6–10.2)
CO2: 24 mmol/L (ref 20–29)
Chloride: 101 mmol/L (ref 96–106)
Creatinine, Ser: 1.27 mg/dL (ref 0.76–1.27)
GFR calc Af Amer: 70 mL/min/{1.73_m2} (ref >59)
GFR, EST NON AFRICAN AMERICAN: 60 mL/min/{1.73_m2} (ref >59)
Globulin, Total: 2.7 g/dL (ref 1.5–4.5)
Glucose: 106 mg/dL — ABNORMAL HIGH (ref 65–99)
POTASSIUM: 4.9 mmol/L (ref 3.5–5.2)
Sodium: 142 mmol/L (ref 134–144)
Total Protein: 7.2 g/dL (ref 6.0–8.5)

## 2017-08-27 LAB — PSA: Prostate Specific Ag, Serum: 4.9 ng/mL — ABNORMAL HIGH (ref 0.0–4.0)

## 2017-08-27 LAB — LIPID PANEL
CHOL/HDL RATIO: 5.4 ratio — AB (ref 0.0–5.0)
Cholesterol, Total: 215 mg/dL — ABNORMAL HIGH (ref 100–199)
HDL: 40 mg/dL (ref >39)
LDL Calculated: 152 mg/dL — ABNORMAL HIGH (ref 0–99)
TRIGLYCERIDES: 113 mg/dL (ref 0–149)
VLDL Cholesterol Cal: 23 mg/dL (ref 5–40)

## 2017-09-28 ENCOUNTER — Encounter: Payer: Self-pay | Admitting: Neurology

## 2017-09-28 ENCOUNTER — Ambulatory Visit: Payer: BLUE CROSS/BLUE SHIELD | Admitting: Neurology

## 2017-09-28 VITALS — BP 132/78 | HR 62 | Ht 72.0 in | Wt 254.0 lb

## 2017-09-28 DIAGNOSIS — G478 Other sleep disorders: Secondary | ICD-10-CM

## 2017-09-28 DIAGNOSIS — J3089 Other allergic rhinitis: Secondary | ICD-10-CM

## 2017-09-28 DIAGNOSIS — E669 Obesity, unspecified: Secondary | ICD-10-CM

## 2017-09-28 DIAGNOSIS — J309 Allergic rhinitis, unspecified: Secondary | ICD-10-CM | POA: Insufficient documentation

## 2017-09-28 DIAGNOSIS — R0683 Snoring: Secondary | ICD-10-CM | POA: Diagnosis not present

## 2017-09-28 DIAGNOSIS — M2619 Other specified anomalies of jaw-cranial base relationship: Secondary | ICD-10-CM | POA: Insufficient documentation

## 2017-09-28 NOTE — Progress Notes (Signed)
SLEEP MEDICINE CLINIC   Provider:  Larey Seat, M D  Primary Care Physician:  Wendie Agreste, MD   Referring Provider: Wendie Agreste, MD   Chief Complaint  Patient presents with  . New Patient (Initial Visit)    pt alone, rm 10. pt had a sleep study 8 yrs.started on CPAP. pt used current machine 6 years ago    HPI:  George Garner is a 62 y.o. male , seen here as in a referral from Dr. Carlota Raspberry for a CPAP follow up.   I have the pleasure of meeting today with George Garner by his own recollection had a sleep study probably between 2007 and 2009 ,  He has owned the same CPAP machine ever since, but quit using it after losing 35 pounds or more. Now he cannot get new supplies, but all symptoms have returned, he is snoring again.  He carries a diagnosis of obstructive sleep apnea on CPAP, respiratory allergies, taking Flonase and Zyrtec for those, hyperlipidemia GERD and benign prostate hyperplasia.  He is up-to-date with his influenza vaccinations, received itching shingles vaccine in 2013, has regular PSA scores.   Lipid is elevated still cholesterol at 254 total, and he lost was seen at [facility] urgent care following an acute upper respiratory infection in 2018 months, he also had a full physical in January of this year.    Chief complaint according to patient : " I may need CPAP again"    Sleep habits are as follows: The patient usually watches TV for the last hour before he retreats to the bedroom, which is around 10 PM.  He has no trouble initiating sleep.  The bedroom is cool, quiet and dark.  He prefers to sleep on his side, on one firm pillow.  The bed has a flat mattress and is not adjustable.  He usually can sleep for several hours before he is woken by the urge to urinate around 3 AM, he may have 1-3 times  nocturia as each night. Rises at 6 .25 AM- he sleeps otherwise through.    Sleep medical history and family sleep history: see above, no childhood sleep disorder, such assleep  walking, night terrors.   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     Review of Systems: Out of a complete 14 system review, the patient complains of only the following symptoms, and all other reviewed systems are negative. Sleepy when not physically active or mentally stimulated, sleepy after meals.  Epworth score  17 , Fatigue severity score 26  , depression score 2/ 15    Social History   Socioeconomic History  . Marital status: Married    Spouse name: Not on file  . Number of children: Not on file  . Years of education: Not on file  . Highest education level: Not on file  Social Needs  . Financial resource strain: Not on file  . Food insecurity - worry: Not on file  . Food insecurity - inability: Not on file  . Transportation needs - medical: Not on file  . Transportation needs - non-medical: Not on file  Occupational History  . Occupation: Best boy: [employer]  Tobacco Use  . Smoking status: Former Smoker    Last attempt to quit: 2002    Years since quitting: 17.1  . Smokeless tobacco: Never Used  Substance and Sexual Activity  . Alcohol use: Yes    Alcohol/week: 0.5 oz    Types: 1 Standard drinks or equivalent per week  . Drug use: No  . Sexual activity: Yes    Birth control/protection: Other-see comments    Comment: vasectomy  Other Topics Concern  . Not on file  Social History Narrative   Marital status: married x 26 years.      Children: 3 children (23, 21, 16); no grandchildren.      Lives: with wife, 1 child at home       Employment: [employer] x 13 years.      Tobacco: quit smoking 15 years ago.      Alcohol: rarely      Exercise:  3 times  per week (weights, cardio).      Seatbelt: 100%      Guns: none    Family History  Problem Relation Age of Onset  . Diabetes Mother   . Dementia Mother   . Arthritis Brother     Past Medical History:  Diagnosis Date  . Allergy    Zyrtec daily.  Flonase PRN.  . BPH (benign prostatic hyperplasia)   . Dyslipidemia   . Erectile dysfunction   . GERD (gastroesophageal reflux disease)   . Hyperlipidemia   . Hypogonadism male   . Obstructive sleep apnea   . OSA on CPAP     Past Surgical History:  Procedure Laterality Date  . CHOLECYSTECTOMY    . deviated septum-cyst    . EYE SURGERY     L eye tumor benign.  Marland Kitchen GALLBLADDER SURGERY    . PILONIDAL CYST EXCISION    . VASECTOMY      Current Outpatient Medications  Medication Sig Dispense Refill  . aspirin 81 MG tablet Take 81 mg by mouth daily.    . cetirizine (ZYRTEC) 10 MG tablet Take 1 tablet (10 mg total) by mouth daily. 30 tablet 1  . fluticasone (FLONASE) 50 MCG/ACT nasal spray Place 2 sprays into both nostrils daily. 16 g 1  . Multiple Vitamin (MULTIVITAMIN) tablet Take 1 tablet by mouth daily.     No current facility-administered medications for this visit.     Allergies as of 09/28/2017 - Review Complete 09/28/2017  Allergen Reaction Noted  . Penicillins Rash     Vitals: BP 132/78   Pulse 62   Ht 6' (1.829 m)   Wt 254 lb (115.2 kg)   BMI 34.45 kg/m  Last Weight:  Wt Readings from Last 1 Encounters:  09/28/17 254 lb (115.2 kg)   DGU:YQIH mass index is 34.45 kg/m.     Last Height:   Ht Readings from Last 1 Encounters:  09/28/17 6' (1.829 m)    Physical exam:  General: The patient is awake, alert and appears not in acute distress. The patient is well groomed. Head: Normocephalic, atraumatic. Neck is supple. Mallampati 2  neck circumference:19 inches . Nasal airflow patent , Retrognathia is seen.  Cardiovascular:  Regular rate and rhythm , without  murmurs or carotid bruit, and without distended neck  veins. Respiratory: Lungs are clear to auscultation. Skin:  Without evidence of edema, or rash Trunk: BMI is 34.45.  The patient's posture is erect   Neurologic exam : The patient is awake and alert,  oriented to place and time.   Attention span & concentration ability appears normal.  Speech is fluent, without dysarthria, dysphonia or aphasia.  Mood and affect are appropriate. Cranial nerves: Pupils are equal and briskly reactive to light. Funduscopic exam without evidence of pallor or edema.  Extraocular movements in vertical and horizontal planes intact and without nystagmus. Visual fields by finger perimetry are intact. Hearing to finger rub intact.  Facial sensation intact to fine touch. Facial motor strength is symmetric and tongue and uvula move midline. Shoulder shrug was symmetrical.  Motor exam:  Normal tone, muscle bulk and symmetric strength in all extremities. Sensory:  Fine touch, pinprick and vibration were tested in all extremities. Proprioception tested in the upper extremities was normal. Coordination: Rapid alternating movements and finger-to-nose maneuver without evidence of ataxia, dysmetria or tremor. Gait and station: Patient walks without assistive device and is able unassisted to climb up to the exam table. Strength within normal limits.Stance is stable and normal.   Deep tendon reflexes: in the  upper and lower extremities are symmetric and intact.  Assessment:  After physical and neurologic examination, review of laboratory studies,  Personal review of imaging studies, reports of other /same  Imaging studies, results of polysomnography and / or neurophysiology testing and pre-existing records as far as provided in visit., my assessment is   1) George Garner still has risk factors for obstructive sleep apnea as he has also regained weight back after initially losing.   His current body mass index, his neck circumference and medical co-morbidities all posts risk factors  for the presence of obstructive sleep apnea -aside from that his wife has already observed at night.  He is snoring, he does have crescendo snoring which is strongly associated with OSA. He has retrognathia.   .  He does have at times elevated blood pressure, and nocturia.  He has been doing well with Flonase keeping the nasal patency up and he is taking a multivitamin as well as a baby aspirin daily.  I like for him to undergo a sleep study to confirm that apnea is still present if not we can address the issue of snoring separately if he has apnea I will offer him to return to CPAP but it would be on a new machine. He needs to lose weight to reach BMI under 28, 185 pounds. He stopped snoring at 210 pounds.    The patient was advised of the nature of the diagnosed disorder , the treatment options and the  risks for general health and wellness arising from not treating the condition.   I spent more than  minutes of face to face time with the patient.  Greater than 50% of time was spent in counseling and coordination of care. We have discussed the diagnosis and differential and I answered the patient's questions.    Plan:  Treatment plan and additional workup :  I will invite George Garner for a sleep study, if his insurance allows [place] to do an attended sleep study I could order a split-night protocol and he could be treated the same night should his apnea index be above 20.  If [employer] will not cover attended sleep study I will order a home sleep test which I consider more of a screening device, but it will help [place] to see if he needs CPAP or if he could be treated with alternative steps.  I think he would also benefit from the attended formal sleep study because  it shows [place] the sleep position in relation to apnea count and sleep stages that are strongly associated with apnea, such as REM sleep.  REM dependent apnea is usually not treated with a dental device and requires CPAP.   Larey Seat, MD 2/35/5732, 20:25 AM  Certified in Neurology by ABPN Certified in [place] by [facility] [address] [place], [place] [address]

## 2017-10-27 ENCOUNTER — Ambulatory Visit (INDEPENDENT_AMBULATORY_CARE_PROVIDER_SITE_OTHER): Payer: BLUE CROSS/BLUE SHIELD | Admitting: Neurology

## 2017-10-27 DIAGNOSIS — E669 Obesity, unspecified: Secondary | ICD-10-CM

## 2017-10-27 DIAGNOSIS — G478 Other sleep disorders: Secondary | ICD-10-CM

## 2017-10-27 DIAGNOSIS — J3089 Other allergic rhinitis: Secondary | ICD-10-CM

## 2017-10-27 DIAGNOSIS — G4733 Obstructive sleep apnea (adult) (pediatric): Secondary | ICD-10-CM

## 2017-10-27 DIAGNOSIS — R0683 Snoring: Secondary | ICD-10-CM

## 2017-10-27 DIAGNOSIS — M2619 Other specified anomalies of jaw-cranial base relationship: Secondary | ICD-10-CM

## 2017-10-31 NOTE — Procedures (Signed)
PATIENT'S NAME:  George, Garner DOB:      February 28, 1956      MR#:    [id number]     DATE OF RECORDING: 10/27/2017 REFERRING M.D.:  Merri Ray, MD Study Performed:  Split-Night Titration Study HISTORY:  George Garner is a 62 y.o. male patient of Dr. Carlota Raspberry who had a sleep study probably between 2007 and 2009 , He has owned the same CPAP machine ever since, but quit using it after losing 35 pounds or more. Now he would like to resume using it, but cannot get new supplies, while all symptoms of OSA have returned, he is excessively daytime sleepy and snoring again.  He carries a diagnosis of obstructive sleep apnea on CPAP, respiratory allergies, hyperlipidemia, GERD and benign prostate hyperplasia.  The patient endorsed the Epworth Sleepiness Scale at 17/ 24 points, the FSS at 26 pints, GDS at 2/ 15 points.  The patient's weight 254 pounds with a height of 72 (inches), resulting in a BMI of 34.3 kg/m2. The patient's neck circumference measured 19 inches.  CURRENT MEDICATIONS: Aspirin, Zyrtec, Flonase, Multivitamin  PROCEDURE:  This is a multichannel digital polysomnogram utilizing the SomnoStar 11.2 system.  Electrodes and sensors were applied and monitored per AASM Specifications.   EEG, EOG, Chin and Limb EMG, were sampled at 200 Hz.  ECG, Snore and Nasal Pressure, Thermal Airflow, Respiratory Effort, CPAP Flow and Pressure, Oximetry was sampled at 50 Hz. Digital video and audio were recorded.      BASELINE STUDY WITHOUT CPAP RESULTS:  Lights Out was at 22:12 and Lights On at 04:58.  Total recording time (TRT) was 164.5, with a total sleep time (TST) of 131 minutes.   The patient's sleep latency was 25 minutes.  REM latency was 67.5 minutes.  The sleep efficiency was 79.6 %.    SLEEP ARCHITECTURE: WASO (Wake after sleep onset) was 5.5 minutes, Stage N1 was 4.5 minutes, Stage N2 was 82 minutes, Stage N3 was 18 minutes and Stage R (REM sleep) was 26.5 minutes.  The percentages were Stage N1 3.4%, Stage N2  62.6%, Stage N3 13.7% and Stage R (REM sleep) 20.2%.  RESPIRATORY ANALYSIS:  There were a total of 56 respiratory events:  42 obstructive apneas, 5 central apneas and 0 mixed apneas with 9 hypopneas. The patient also had 0 respiratory event related arousals (RERAs). Snoring was noted. The total APNEA/HYPOPNEA INDEX (AHI) was 25.6 /hour and the total RESPIRATORY DISTURBANCE INDEX was 25.6 /hour.  1 event occurred in REM sleep and 21 events in NREM. The REM AHI was 2.3 /hour versus a non-REM AHI of 31.6 /hour. The patient spent 31.5 minutes sleep time in the supine position and 324 minutes in non-supine. The supine AHI was 80.0 /hour versus a non-supine AHI of 8.4 /hour.  OXYGEN SATURATION & C02:  The wake baseline 02 saturation was 90%, with the lowest being 78%. Time spent below 89% saturation equaled 26 minutes.  PERIODIC LIMB MOVEMENTS:   The patient had a total of 0 Periodic Limb Movements.  The arousals were noted as: 19 were spontaneous, 0 were associated with PLMs, and 47 were associated with respiratory events. Audio and video analysis did not show any abnormal or unusual movements, behaviors, phonations or vocalizations. The patient took one bathroom break. Snoring was noted, RDI was 25 at time of SPLIT protocol.  EKG was in keeping with normal sinus rhythm (NSR).  TITRATION STUDY WITH CPAP RESULTS: CPAP was initiated at 5 cmH20 with heated humidity per AASM split  night standards and use of an ESON 2 nasal mask in medium size. The pressure was advanced to 7 cmH20 because of hypopneas and apneas.  At a PAP pressure of 7 cmH20, a reduction to an AHI of 0.0 /hour was reached -for 100 minutes of sleep, Nadir SpO2 was 92%.  Total recording time (TRT) was 241.5 minutes, with a total sleep time (TST) of 224 minutes. The patient's sleep latency was 9.5 minutes. REM latency was 53 minutes.  The sleep efficiency was 92.8 %.    SLEEP ARCHITECTURE: Wake after sleep was 7.5 minutes, Stage N1 12 minutes, Stage  N2 129 minutes, Stage N3 26.5 minutes and Stage R (REM sleep) 56.5 minutes. The percentages were: Stage N1 5.4%, Stage N2 57.6%, Stage N3 11.8% and Stage R (REM sleep) 25.2%.  RESPIRATORY ANALYSIS:  There were a total of 1 respiratory event: 0 apneas and 1 hypopnea with a hypopnea index of 0.3 /hour. The patient also had 0 respiratory event related arousals (RERAs).     The total APNEA/HYPOPNEA INDEX (AHI) was 0.3 /hour and the total RESPIRATORY DISTURBANCE INDEX was 0.3 /hour.  1 event in NREM with a non-REM AHI of 0.4 /hour.  The patient spent 0% of total sleep time in the supine position.  OXYGEN SATURATION & C02:  The wake baseline 02 saturation was 92%, with the lowest being 91%.   PERIODIC LIMB MOVEMENTS:   The patient had a total of 0 Periodic Limb Movements.  The arousals were noted as: 14 were spontaneous, 0 were associated with PLMs, and only 1 was associated with respiratory events.  POLYSOMNOGRAPHY IMPRESSION :  1. Moderate degree of Obstructive Sleep Apnea (OSA) with supine accentuation. Good response to only 7 cm water pressure. Snoring and AHI alleviated for 100 minutes of sleep time.   RECOMMENDATIONS: 1. Advise to start auto-titration capable CPAP at 7 cmH2O, with heated humidity, without EPR, and using an ESON 2 nasal mask in medium size. We will follow clinical symptomatology in the sleep clinic.   2. A follow up appointment will be scheduled in the Sleep Clinic at [facility].     I certify that I have reviewed the entire raw data recording prior to the issuance of this report in accordance with the Standards of Accreditation of the American Academy of Sleep Medicine (AASM)    Larey Seat, M.D.    10-31-2017  Diplomat, American Board of Psychiatry and Neurology  Diplomat, [place] of Sleep Medicine Medical Director of [employer] [facility] at [facility]

## 2017-10-31 NOTE — Addendum Note (Signed)
Addended by: Larey Seat on: 10/31/2017 05:14 PM   Modules accepted: Orders

## 2017-11-02 ENCOUNTER — Telehealth: Payer: Self-pay | Admitting: Neurology

## 2017-11-02 NOTE — Telephone Encounter (Signed)
I called pt. I advised pt that Dr. Brett Fairy reviewed their sleep study results and found that pt has sleep apnea. Dr. Brett Fairy recommends that pt starts CPAP at a pressure of 7 cm. I reviewed PAP compliance expectations with the pt. Pt is agreeable to starting a CPAP. I advised pt that an order will be sent to a DME, [facility], and [facility] will call the pt within about one week after they file with the pt's insurance. [facility] will show the pt how to use the machine, fit for masks, and troubleshoot the CPAP if needed. A follow up appt was made for insurance purposes with Jeralene Peters on January 31, 2018 at 9:15 am . Pt verbalized understanding to arrive 15 minutes early and bring their CPAP. A letter with all of this information in it will be mailed to the pt as a reminder. I verified with the pt that the address we have on file is correct. Pt verbalized understanding of results. Pt had no questions at this time but was encouraged to call back if questions arise.

## 2017-11-02 NOTE — Telephone Encounter (Signed)
-----   Message from Larey Seat, MD sent at 10/31/2017  5:14 PM EDT ----- POLYSOMNOGRAPHY IMPRESSION :  1. Moderate degree of Obstructive Sleep Apnea (OSA) with supine  accentuation. Good response to only 7 cm water pressure. Snoring and AHI alleviated for 100 minutes of sleep time.  RECOMMENDATIONS: 1. Advise to start auto-titration capable CPAP at 7 cmH2O, with heated humidity, without EPR, and using an ESON 2 nasal mask in medium size. We will follow clinical symptomatology in the sleep clinic.  2. A follow up appointment will be scheduled in the Sleep Clinic at [facility].

## 2017-11-28 NOTE — Telephone Encounter (Signed)
Pt called he spoke with [facility] about 4/13 or 4/15 and advised the insurance had been filed but they have not heard back. Pt will call [facility] back and get status of the insurance. If no success he will call clinic back  [facility]

## 2017-11-28 NOTE — Telephone Encounter (Signed)
I have also forwarded an email to [employer] to be on the look out for the pt call and to make sure to contact him.

## 2017-11-29 ENCOUNTER — Ambulatory Visit: Payer: BLUE CROSS/BLUE SHIELD | Admitting: Urgent Care

## 2017-11-29 ENCOUNTER — Encounter: Payer: Self-pay | Admitting: Urgent Care

## 2017-11-29 VITALS — BP 132/76 | HR 69 | Temp 98.6°F | Resp 16 | Ht 72.0 in | Wt 241.4 lb

## 2017-11-29 DIAGNOSIS — H9311 Tinnitus, right ear: Secondary | ICD-10-CM | POA: Diagnosis not present

## 2017-11-29 DIAGNOSIS — Z9109 Other allergy status, other than to drugs and biological substances: Secondary | ICD-10-CM | POA: Diagnosis not present

## 2017-11-29 DIAGNOSIS — H938X1 Other specified disorders of right ear: Secondary | ICD-10-CM | POA: Diagnosis not present

## 2017-11-29 MED ORDER — PSEUDOEPHEDRINE HCL ER 120 MG PO TB12: 120.0000 mg | ORAL_TABLET | Freq: Two times a day (BID) | ORAL | 3 refills | Status: DC

## 2017-11-29 NOTE — Progress Notes (Signed)
   MRN: [id number] DOB: Dec 25, 1955  Subjective:   George Garner is a 62 y.o. male presenting for 2-day history of right ear fullness, tinnitus.  Patient states that he was sitting close to a speaker and has since had trouble with his right ear.  He admits a history of allergies but takes Zyrtec and Flonase consistently.  Denies fever, dizziness, vertigo, ear pain, ear drainage.  George Garner has a current medication list which includes the following prescription(s): aspirin, cetirizine, fluticasone, and multivitamin. Also is allergic to penicillins.  George Garner  has a past medical history of Allergy, BPH (benign prostatic hyperplasia), Dyslipidemia, Erectile dysfunction, GERD (gastroesophageal reflux disease), Hyperlipidemia, Hypogonadism male, Obstructive sleep apnea, and OSA on CPAP. Also  has a past surgical history that includes Gallbladder surgery; Pilonidal cyst excision; Vasectomy; Cholecystectomy; Eye surgery; and deviated septum-cyst.  Objective:   Vitals: BP 132/76   Pulse 69   Temp 98.6 F (37 C) (Oral)   Resp 16   Ht 6' (1.829 m)   Wt 241 lb 6.4 oz (109.5 kg)   SpO2 95%   BMI 32.74 kg/m   Physical Exam  Constitutional: He is oriented to person, place, and time. He appears well-developed and well-nourished.  HENT:  Right Ear: Tympanic membrane normal.  Left Ear: Tympanic membrane normal.  Eyes: Right eye exhibits no discharge. Left eye exhibits no discharge.  Cardiovascular: Normal rate.  Pulmonary/Chest: Effort normal.  Neurological: He is alert and oriented to person, place, and time.  Skin: Skin is warm and dry.  Psychiatric: He has a normal mood and affect.   Assessment and Plan :   Tinnitus of right ear  Environmental allergies  Ear fullness, right  Maintain Zyrtec and Flonase, add pseudoephedrine just in case patient is having eustachian tube dysfunction.  Offered patient referral to ENT.  He will hold off on this until later in the week if he continues to have symptoms he will  let me know we will pursue a referral.  Jaynee Eagles, PA-C [facility] [place] [phone] 11/29/2017  8:50 AM

## 2017-11-29 NOTE — Patient Instructions (Addendum)
Tinnitus Tinnitus refers to hearing a sound when there is no actual source for that sound. This is often described as ringing in the ears. However, people with this condition may hear a variety of noises. A person may hear the sound in one ear or in both ears. The sounds of tinnitus can be soft, loud, or somewhere in between. Tinnitus can last for a few seconds or can be constant for days. It may go away without treatment and come back at various times. When tinnitus is constant or happens often, it can lead to other problems, such as trouble sleeping and trouble concentrating. Almost everyone experiences tinnitus at some point. Tinnitus that is long-lasting (chronic) or comes back often is a problem that may require medical attention. What are the causes? The cause of tinnitus is often not known. In some cases, it can result from other problems or conditions, including:  Exposure to loud noises from machinery, music, or other sources.  Hearing loss.  Ear or sinus infections.  Earwax buildup.  A foreign object in the ear.  Use of certain medicines.  Use of alcohol and caffeine.  High blood pressure.  Heart diseases.  Anemia.  Allergies.  Meniere disease.  Thyroid problems.  Tumors.  An enlarged part of a weakened blood vessel (aneurysm).  What are the signs or symptoms? The main symptom of tinnitus is hearing a sound when there is no source for that sound. It may sound like:  Buzzing.  Roaring.  Ringing.  Blowing air, similar to the sound heard when you listen to a seashell.  Hissing.  Whistling.  Sizzling.  Humming.  Running water.  A sustained musical note.  How is this diagnosed? Tinnitus is diagnosed based on your symptoms. Your health care provider will do a physical exam. A comprehensive hearing exam (audiologic exam) will be done if your tinnitus:  Affects only one ear (unilateral).  Causes hearing difficulties.  Lasts 6 months or  longer.  You may also need to see a health care provider who specializes in hearing disorders (audiologist). You may be asked to complete a questionnaire to determine the severity of your tinnitus. Tests may be done to help determine the cause and to rule out other conditions. These can include:  Imaging studies of your head and brain, such as: ? A CT scan. ? An MRI.  An imaging study of your blood vessels (angiogram).  How is this treated? Treating an underlying medical condition can sometimes make tinnitus go away. If your tinnitus continues, other treatments may include:  Medicines, such as certain antidepressants or sleeping aids.  Sound generators to mask the tinnitus. These include: ? Tabletop sound machines that play relaxing sounds to help you fall asleep. ? Wearable devices that fit in your ear and play sounds or music. ? A small device that uses headphones to deliver a signal embedded in music (acoustic neural stimulation). In time, this may change the pathways of your brain and make you less sensitive to tinnitus. This device is used for very severe cases when no other treatment is working.  Therapy and counseling to help you manage the stress of living with tinnitus.  Using hearing aids or cochlear implants, if your tinnitus is related to hearing loss.  Follow these instructions at home:  When possible, avoid being in loud places and being exposed to loud sounds.  Wear hearing protection, such as earplugs, when you are exposed to loud noises.  Do not take stimulants, such as nicotine,   alcohol, or caffeine.  Practice techniques for reducing stress, such as meditation, yoga, or deep breathing.  Use a white noise machine, a humidifier, or other devices to mask the sound of tinnitus.  Sleep with your head slightly raised. This may reduce the impact of tinnitus.  Try to get plenty of rest each night. Contact a health care provider if:  You have tinnitus in just one  ear.  Your tinnitus continues for 3 weeks or longer without stopping.  Home care measures are not helping.  You have tinnitus after a head injury.  You have tinnitus along with any of the following: ? Dizziness. ? Loss of balance. ? Nausea and vomiting. This information is not intended to replace advice given to you by your health care provider. Make sure you discuss any questions you have with your health care provider. Document Released: 07/19/2005 Document Revised: 03/21/2016 Document Reviewed: 12/19/2013 Elsevier Interactive Patient Education  2018 [employer].     IF you received an x-ray today, you will receive an invoice from [facility]. Please contact [facility] at [phone] with questions or concerns regarding your invoice.   IF you received labwork today, you will receive an invoice from [place]. Please contact [facility] at [phone] with questions or concerns regarding your invoice.   Our billing staff will not be able to assist you with questions regarding bills from these companies.  You will be contacted with the lab results as soon as they are available. The fastest way to get your results is to activate your My Chart account. Instructions are located on the last page of this paperwork. If you have not heard from [place] regarding the results in 2 weeks, please contact this office.

## 2017-12-01 ENCOUNTER — Encounter: Payer: Self-pay | Admitting: Urgent Care

## 2017-12-01 DIAGNOSIS — H9311 Tinnitus, right ear: Secondary | ICD-10-CM

## 2017-12-01 DIAGNOSIS — H938X1 Other specified disorders of right ear: Secondary | ICD-10-CM

## 2017-12-02 DIAGNOSIS — G4733 Obstructive sleep apnea (adult) (pediatric): Secondary | ICD-10-CM | POA: Diagnosis not present

## 2017-12-13 ENCOUNTER — Encounter: Payer: Self-pay | Admitting: Family Medicine

## 2017-12-13 DIAGNOSIS — B360 Pityriasis versicolor: Secondary | ICD-10-CM

## 2017-12-17 MED ORDER — KETOCONAZOLE 200 MG PO TABS: 400.0000 mg | ORAL_TABLET | Freq: Once | ORAL | 0 refills | Status: AC

## 2018-01-02 DIAGNOSIS — G4733 Obstructive sleep apnea (adult) (pediatric): Secondary | ICD-10-CM | POA: Diagnosis not present

## 2018-01-06 DIAGNOSIS — H903 Sensorineural hearing loss, bilateral: Secondary | ICD-10-CM | POA: Diagnosis not present

## 2018-01-06 DIAGNOSIS — H9311 Tinnitus, right ear: Secondary | ICD-10-CM | POA: Diagnosis not present

## 2018-01-30 NOTE — Progress Notes (Signed)
[facility]  PATIENT: Cleburne Savini DOB: [birth year]   REASON FOR VISIT: Follow-up for newly diagnosed obstructive sleep apnea with initial CPAP compliance HISTORY FROM: Patient    HISTORY OF PRESENT ILLNESS:UPDATE 7/2/2019CM. Mr Vilar, 62 year old male returns for follow-up with obstructive sleep apnea here for initial CPAP.  He continues to significant drowsiness.  ESS dropped from 17-15 with treatment.  Data 12/31/2017-01/29/2018 shows compliance greater than 4 hours at 100%.  Average usage 7 hours 4 minutes.  Set pressure 7 cm.  EPR level 1 leak 95th percentile22.6.  AHI 0.7 He returns for reevaluation.    2/27/19CDMr. Sciarra by his own recollection had a sleep study probably between 2007 and 2009 ,  He has owned the same CPAP machine ever since, but quit using it after losing 35 pounds or more. Now he cannot get new supplies, but all symptoms have returned, he is snoring again.  He carries a diagnosis of obstructive sleep apnea on CPAP, respiratory allergies, taking Flonase and Zyrtec for those, hyperlipidemia GERD and benign prostate hyperplasia.  He is up-to-date with his influenza vaccinations, received itching shingles vaccine in 2013, has regular PSA scores.   Lipid is elevated still cholesterol at 254 total, and he lost was seen at [facility] urgent care following an acute upper respiratory infection in 2018 months, he also had a full physical in January of this year.    Chief complaint according to patient : " I may need CPAP again"    Sleep habits are as follows: The patient usually watches TV for the last hour before he retreats to the bedroom, which is around 10 PM.  He has no trouble initiating sleep.  The bedroom is cool, quiet and dark.  He prefers to sleep on his side, on one firm pillow.  The bed has a flat mattress and is not adjustable.  He usually can sleep for several hours before he is woken by the urge to urinate around 3 AM, he may have 1-3 times  nocturia as each  night. Rises at 6 .25 AM- he sleeps otherwise through.      REVIEW OF SYSTEMS: Full 14 system review of systems performed and notable only for those listed, all others are neg:  Constitutional: neg  Cardiovascular: neg Ear/Nose/Throat: neg  Skin: neg Eyes: neg Respiratory: neg Gastroitestinal: neg  Hematology/Lymphatic: neg  Endocrine: neg Musculoskeletal:neg Allergy/Immunology: neg Neurological: neg Psychiatric: neg Sleep : Obstructive sleep apnea with CPAP, continue daytime drowsiness   ALLERGIES: Allergies  Allergen Reactions  . Penicillins Rash    HOME MEDICATIONS: Outpatient Medications Prior to Visit  Medication Sig Dispense Refill  . aspirin 81 MG tablet Take 81 mg by mouth daily.    . cetirizine (ZYRTEC) 10 MG tablet Take 1 tablet (10 mg total) by mouth daily. 30 tablet 1  . fluticasone (FLONASE) 50 MCG/ACT nasal spray Place 2 sprays into both nostrils daily. 16 g 1  . Multiple Vitamin (MULTIVITAMIN) tablet Take 1 tablet by mouth daily.    . pseudoephedrine (SUDAFED 12 HOUR) 120 MG 12 hr tablet Take 1 tablet (120 mg total) by mouth 2 (two) times daily. (Patient not taking: Reported on 01/31/2018) 30 tablet 3   No facility-administered medications prior to visit.     PAST MEDICAL HISTORY: Past Medical History:  Diagnosis Date  . Allergy    Zyrtec daily.  Flonase PRN.  . BPH (benign prostatic hyperplasia)   . Dyslipidemia   . Erectile dysfunction   . GERD (gastroesophageal reflux  disease)   . Hyperlipidemia   . Hypogonadism male   . Obstructive sleep apnea   . OSA on CPAP     PAST SURGICAL HISTORY: Past Surgical History:  Procedure Laterality Date  . CHOLECYSTECTOMY    . deviated septum-cyst    . EYE SURGERY     L eye tumor benign.  Marland Kitchen GALLBLADDER SURGERY    . PILONIDAL CYST EXCISION    . VASECTOMY      FAMILY HISTORY: Family History  Problem Relation Age of Onset  . Diabetes Mother   . Dementia Mother   . Arthritis Brother     SOCIAL  HISTORY: Social History   Socioeconomic History  . Marital status: Married    Spouse name: Not on file  . Number of children: Not on file  . Years of education: Not on file  . Highest education level: Not on file  Occupational History  . Occupation: Best boy: [employer]  Social Needs  . Financial resource strain: Not on file  . Food insecurity:    Worry: Not on file    Inability: Not on file  . Transportation needs:    Medical: Not on file    Non-medical: Not on file  Tobacco Use  . Smoking status: Former Smoker    Last attempt to quit: 2002    Years since quitting: 17.5  . Smokeless tobacco: Never Used  Substance and Sexual Activity  . Alcohol use: Yes    Alcohol/week: 0.6 oz    Types: 1 Standard drinks or equivalent per week  . Drug use: No  . Sexual activity: Yes    Birth control/protection: Other-see comments    Comment: vasectomy  Lifestyle  . Physical activity:    Days per week: Not on file    Minutes per session: Not on file  . Stress: Not on file  Relationships  . Social connections:    Talks on phone: Not on file    Gets together: Not on file    Attends religious service: Not on file    Active member of club or organization: Not on file    Attends meetings of clubs or organizations: Not on file    Relationship status: Not on file  . Intimate partner violence:    Fear of current or ex partner: Not on file    Emotionally abused: Not on file    Physically abused: Not on file    Forced sexual activity: Not on file  Other Topics Concern  . Not on file  Social History Narrative   Marital status: married x 26 years.      Children: 3 children (23, 21, 16); no grandchildren.      Lives: with wife, 1 child at home       Employment: [employer] x 13 years.      Tobacco: quit smoking 15 years ago.      Alcohol: rarely      Exercise:  3 times per week (weights, cardio).      Seatbelt: 100%      Guns: none      PHYSICAL EXAM  Vitals:   01/31/18 0904  BP: 125/74  Pulse: 74  Weight: 247 lb 3.2 oz (112.1 kg)  Height: 6' (1.829 m)   Body mass index is 33.53 kg/m.  Generalized: Well developed, obese male in no acute distress  Head: normocephalic and atraumatic,. Oropharynx benign mallopatti 2  Neck: Supple, circumference 19  Lungs clear Skin no peripheral edema Musculoskeletal: No deformity   Neurological examination   Mentation: Alert oriented to time, place, history taking. Attention span and concentration appropriate. Recent and remote memory intact.  Follows all commands speech and language fluent. ESS 15 down from 17 prior to tx  Cranial nerve II-XII: Pupils were equal round reactive to light extraocular movements were full, visual field were full on confrontational test. Facial sensation and strength were normal. hearing was intact to finger rubbing bilaterally. Uvula tongue midline. head turning and shoulder shrug were normal and symmetric.Tongue protrusion into cheek strength was normal. Motor: normal bulk and tone, full strength in the BUE, BLE,   Sensory: normal and symmetric to light touch,  Coordination: finger-nose-finger, heel-to-shin bilaterally, no dysmetria Gait and Station: Rising up from seated position without assistance, normal stance,  moderate stride, good arm swing, smooth turning, able to perform tiptoe, and heel walking without difficulty. Tandem gait is steady  DIAGNOSTIC DATA (LABS, IMAGING, TESTING) - I reviewed patient records, labs, notes, testing and imaging myself where available.  Lab Results  Component Value Date   WBC 8.1 07/02/2016   HGB 14.9 07/02/2016   HCT 43.4 07/02/2016   MCV 88.8 07/02/2016   PLT 192 07/02/2016      Component Value Date/Time   NA 142 08/26/2017 1101   K 4.9 08/26/2017 1101   CL 101 08/26/2017 1101   CO2 24 08/26/2017 1101   GLUCOSE 106 (H) 08/26/2017 1101   GLUCOSE 87 07/02/2016 1650   BUN 20 08/26/2017 1101    CREATININE 1.27 08/26/2017 1101   CREATININE 1.13 07/02/2016 1650   CALCIUM 9.8 08/26/2017 1101   PROT 7.2 08/26/2017 1101   ALBUMIN 4.5 08/26/2017 1101   AST 21 08/26/2017 1101   ALT 25 08/26/2017 1101   ALKPHOS 64 08/26/2017 1101   BILITOT 0.5 08/26/2017 1101   GFRNONAA 60 08/26/2017 1101   GFRNONAA 70 07/02/2016 1650   GFRAA 70 08/26/2017 1101   GFRAA 81 07/02/2016 1650   Lab Results  Component Value Date   CHOL 215 (H) 08/26/2017   HDL 40 08/26/2017   LDLCALC 152 (H) 08/26/2017   TRIG 113 08/26/2017   CHOLHDL 5.4 (H) 08/26/2017   Lab Results  Component Value Date   HGBA1C 5.6 06/23/2015   No results found for: [id number] Lab Results  Component Value Date   TSH 1.70 07/02/2016      ASSESSMENT AND PLAN  62 y.o. year old male here to follow-up for obstructive sleep apnea with initial CPAP.  ESS score remains high at 15. Data 12/31/2017-01/29/2018 shows compliance greater than 4 hours at 100%.  Average usage 7 hours 4 minutes.  Set pressure 7 cm.  EPR level 1 leak 95th percentile22.6.  AHI 0.7   CPAP compliance 100% Patient does have a leak will get mask refit If ESS stays elevated consider also narcolepsy Follow-up in 3 months Dennie Bible, [facility], [facility], [facility] [address] [place], [place] [address] [phone]

## 2018-01-31 ENCOUNTER — Encounter: Payer: Self-pay | Admitting: Nurse Practitioner

## 2018-01-31 ENCOUNTER — Ambulatory Visit: Payer: BLUE CROSS/BLUE SHIELD | Admitting: Nurse Practitioner

## 2018-01-31 DIAGNOSIS — Z9989 Dependence on other enabling machines and devices: Secondary | ICD-10-CM

## 2018-01-31 DIAGNOSIS — G4733 Obstructive sleep apnea (adult) (pediatric): Secondary | ICD-10-CM

## 2018-01-31 NOTE — Progress Notes (Signed)
CPAP order re: mask refit successfully faxed to [facility].

## 2018-01-31 NOTE — Patient Instructions (Signed)
CPAP compliance 100% Patient does have a leak will get mask refit Follow-up in 3 months

## 2018-02-01 DIAGNOSIS — G4733 Obstructive sleep apnea (adult) (pediatric): Secondary | ICD-10-CM | POA: Diagnosis not present

## 2018-02-10 DIAGNOSIS — H903 Sensorineural hearing loss, bilateral: Secondary | ICD-10-CM | POA: Diagnosis not present

## 2018-02-10 DIAGNOSIS — H9313 Tinnitus, bilateral: Secondary | ICD-10-CM | POA: Diagnosis not present

## 2018-02-13 DIAGNOSIS — M9901 Segmental and somatic dysfunction of cervical region: Secondary | ICD-10-CM | POA: Diagnosis not present

## 2018-02-13 DIAGNOSIS — M542 Cervicalgia: Secondary | ICD-10-CM | POA: Diagnosis not present

## 2018-02-13 DIAGNOSIS — M9906 Segmental and somatic dysfunction of lower extremity: Secondary | ICD-10-CM | POA: Diagnosis not present

## 2018-02-13 DIAGNOSIS — M25562 Pain in left knee: Secondary | ICD-10-CM | POA: Diagnosis not present

## 2018-02-17 DIAGNOSIS — M9906 Segmental and somatic dysfunction of lower extremity: Secondary | ICD-10-CM | POA: Diagnosis not present

## 2018-02-17 DIAGNOSIS — M542 Cervicalgia: Secondary | ICD-10-CM | POA: Diagnosis not present

## 2018-02-17 DIAGNOSIS — M9901 Segmental and somatic dysfunction of cervical region: Secondary | ICD-10-CM | POA: Diagnosis not present

## 2018-02-17 DIAGNOSIS — M25562 Pain in left knee: Secondary | ICD-10-CM | POA: Diagnosis not present

## 2018-02-20 ENCOUNTER — Encounter: Payer: Self-pay | Admitting: Family Medicine

## 2018-02-20 ENCOUNTER — Encounter: Payer: Self-pay | Admitting: Gastroenterology

## 2018-02-20 ENCOUNTER — Other Ambulatory Visit: Payer: Self-pay

## 2018-02-20 ENCOUNTER — Ambulatory Visit: Payer: BLUE CROSS/BLUE SHIELD | Admitting: Family Medicine

## 2018-02-20 VITALS — BP 136/70 | HR 66 | Temp 98.9°F | Ht 72.0 in | Wt 244.8 lb

## 2018-02-20 DIAGNOSIS — Z1159 Encounter for screening for other viral diseases: Secondary | ICD-10-CM

## 2018-02-20 DIAGNOSIS — Z114 Encounter for screening for human immunodeficiency virus [HIV]: Secondary | ICD-10-CM | POA: Diagnosis not present

## 2018-02-20 DIAGNOSIS — Z6833 Body mass index (BMI) 33.0-33.9, adult: Secondary | ICD-10-CM

## 2018-02-20 DIAGNOSIS — Z1211 Encounter for screening for malignant neoplasm of colon: Secondary | ICD-10-CM

## 2018-02-20 DIAGNOSIS — E785 Hyperlipidemia, unspecified: Secondary | ICD-10-CM

## 2018-02-20 NOTE — Progress Notes (Signed)
Subjective:    Patient ID: George Garner, male    DOB: 1956-05-14, 62 y.o.   MRN: [id number]  HPI George Garner is a 62 y.o. male Presents today for: Chief Complaint  Patient presents with  . Hyperlipidemia    6 m f/u. had the rash since he was a teenager   Obstructive sleep apnea: Seen July 2nd at [facility] neuro.  He is on CPAP.  Compliant that time.  Mask fit issues, plans on getting a new one.  ESS score remained high at 15.  Differential included narcolepsy if levels remained elevated with plan for follow-up with neuro in 3 months  Hyperlipidemia:  Lab Results  Component Value Date   CHOL 215 (H) 08/26/2017   HDL 40 08/26/2017   LDLCALC 152 (H) 08/26/2017   TRIG 113 08/26/2017   CHOLHDL 5.4 (H) 08/26/2017   Lab Results  Component Value Date   ALT 25 08/26/2017   AST 21 08/26/2017   ALKPHOS 64 08/26/2017   BILITOT 0.5 08/26/2017   Recommend statin medication at previous level, but option of exercise changes and diet changes with recheck was given.  Here for follow-up today.has been walking for exercise - 3.5 mi per day.  More since it has warmed up.  Fasts until 11am, then protein shake. Dinner is largest meal at 6:30pm.   Wt Readings from Last 3 Encounters:  02/20/18 244 lb 12.8 oz (111 kg)  01/31/18 247 lb 3.2 oz (112.1 kg)  11/29/17 241 lb 6.4 oz (109.5 kg)  Body mass index is 33.2 kg/m.  The 10-year ASCVD risk score Mikey Bussing DC Brooke Bonito., et al., 2013) is: 13.7%   Values used to calculate the score:     Age: 19 years     Sex: Male     Is Non-Hispanic African American: No     Diabetic: No     Tobacco smoker: No     Systolic Blood Pressure: 676 mmHg     Is BP treated: No     HDL Cholesterol: 40 mg/dL     Total Cholesterol: 215 mg/dL  Health maintenance: Due for colonoscopy and hep C screening.  Last colonoscopy listed as Dec 01, 2007.  Patient Active Problem List   Diagnosis Date Noted  . Obstructive sleep apnea treated with continuous positive airway pressure (CPAP)  01/31/2018  . Snoring 09/28/2017  . UARS (upper airway resistance syndrome) 09/28/2017  . Retrognathia 09/28/2017  . Allergic rhinitis due to allergen 09/28/2017  . Obesity (BMI 30.0-34.9) 09/28/2017  . Acute URI 02/09/2017  . Chronic pain of left knee 02/09/2017  . Other seasonal allergic rhinitis 05/24/2014  . OSA on CPAP 05/24/2014  . GERD (gastroesophageal reflux disease)   . Dyslipidemia   . HYPOGODADISM MALE   . Cough 11/06/2008   Past Medical History:  Diagnosis Date  . Allergy    Zyrtec daily.  Flonase PRN.  . BPH (benign prostatic hyperplasia)   . Dyslipidemia   . Erectile dysfunction   . GERD (gastroesophageal reflux disease)   . Hyperlipidemia   . Hypogonadism male   . Obstructive sleep apnea   . OSA on CPAP    Past Surgical History:  Procedure Laterality Date  . CHOLECYSTECTOMY    . deviated septum-cyst    . EYE SURGERY     L eye tumor benign.  Marland Kitchen GALLBLADDER SURGERY    . PILONIDAL CYST EXCISION    . VASECTOMY     Allergies  Allergen Reactions  . Penicillins Rash  Prior to Admission medications   Medication Sig Start Date End Date Taking? Authorizing Provider  aspirin 81 MG tablet Take 81 mg by mouth daily.   Yes [provider]  cetirizine (ZYRTEC) 10 MG tablet Take 1 tablet (10 mg total) by mouth daily. 12/10/16  Yes McVey, Gelene Mink, PA-C  fluticasone (FLONASE) 50 MCG/ACT nasal spray Place 2 sprays into both nostrils daily. 12/10/16  Yes McVey, Gelene Mink, PA-C  Multiple Vitamin (MULTIVITAMIN) tablet Take 1 tablet by mouth daily.   Yes [provider]   Social History   Socioeconomic History  . Marital status: Married    Spouse name: Not on file  . Number of children: Not on file  . Years of education: Not on file  . Highest education level: Not on file  Occupational History  . 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 Financial resource strain: Not on file  . Food  insecurity:    Worry: Not on file    Inability: Not on file  . Transportation needs:    Medical: Not on file    Non-medical: Not on file  Tobacco Use  . Smoking status: Former Smoker    Last attempt to quit: 2002    Years since quitting: 17.5  . Smokeless tobacco: Never Used  Substance and Sexual Activity  . Alcohol use: Yes    Alcohol/week: 0.6 oz    Types: 1 Standard drinks or equivalent per week  . Drug use: No  . Sexual activity: Yes    Birth control/protection: Other-see comments    Comment: vasectomy  Lifestyle  . Physical activity:    Days per week: Not on file    Minutes per session: Not on file  . Stress: Not on file  Relationships  . Social connections:    Talks on phone: Not on file    Gets together: Not on file    Attends religious service: Not on file    Active member of club or organization: Not on file    Attends meetings of clubs or organizations: Not on file    Relationship status: Not on file  . Intimate partner violence:    Fear of current or ex partner: Not on file    Emotionally abused: Not on file    Physically abused: Not on file    Forced sexual activity: Not on file  Other Topics Concern  . Not on file  Social History Narrative   Marital status: married x 26 years.      Children: 3 children (23, 21, 16); no grandchildren.      Lives: with wife, 1 child at home       Employment: [employer] x 13 years.      Tobacco: quit smoking 15 years ago.      Alcohol: rarely      Exercise:  3 times per week (weights, cardio).      Seatbelt: 100%      Guns: none    Review of Systems Per hpi    Objective:   Physical Exam  Constitutional: He is oriented to person, place, and time. He appears well-developed and well-nourished.  HENT:  Head: Normocephalic and atraumatic.  Eyes: Pupils are equal, round, and reactive to light. EOM are normal.  Neck: No JVD present. Carotid bruit is not present.  Cardiovascular: Normal rate, regular  rhythm and normal heart sounds.  No murmur heard. Pulmonary/Chest: Effort normal and  breath sounds normal. He has no rales.  Musculoskeletal: He exhibits no edema.  Neurological: He is alert and oriented to person, place, and time.  Skin: Skin is warm and dry.  Psychiatric: He has a normal mood and affect.  Vitals reviewed.  Vitals:   02/20/18 0820  BP: 136/70  Pulse: 66  Temp: 98.9 F (37.2 C)  TempSrc: Oral  SpO2: 94%  Weight: 244 lb 12.8 oz (111 kg)  Height: 6' (1.829 m)        Assessment & Plan:  George Garner is a 62 y.o. male Hyperlipidemia, unspecified hyperlipidemia type - Plan: Lipid panel, Comprehensive metabolic panel  -Previous ASCVD risk discussed with current numbers.  Has increased exercise, weight has overall been stable.  Repeat lipid testing.  Does admit to some increased ice cream/diet changed last night, but has been fairly consistent otherwise.  Can review options with levels and ASCVD risk.  Exercise, diet changes for weight loss should also help.  Screening for HIV (human immunodeficiency virus) - Plan: HIV antibody  Need for hepatitis C screening test - Plan: Hepatitis C antibody  Encounter for screening colonoscopy - Screen for colon cancer - Plan: Ambulatory referral to Gastroenterology  -Due for colonoscopy, referral placed  BMI 33.0-33.9,adult  -Weight stable, but discussed BMI in obesity range.  Does get sufficient exercise with walking each day, but suspect evening meal and portion sizes may be the issue.  Also recommended having an earlier breakfast at 11 AM to help with metabolism.  Recommended to avoid meals outside of the home as much as possible to help with portion control and ingredients, and tips for eating out discussed with portion control.  He was aware of these already.  Recheck 6 months  No orders of the defined types were placed in this encounter.  Patient Instructions   I would recommend eating earlier in the morning to see if that  helps with weight.  Continue exercise. I will repeat the cholesterol tests today.  I will also test for HIV and hepatitis C as recommended.  I referred you to gastroenterology.  Let me know if there is a different practice that is requested.  Thank you for coming in today, and follow-up in 6 months for physical.  Thanks for coming in today.        IF you received an x-ray today, you will receive an invoice from [facility]. Please contact [facility] at [phone] with questions or concerns regarding your invoice.   IF you received labwork today, you will receive an invoice from [place]. Please contact [facility] at [phone] with questions or concerns regarding your invoice.   Our billing staff will not be able to assist you with questions regarding bills from these companies.  You will be contacted with the lab results as soon as they are available. The fastest way to get your results is to activate your My Chart account. Instructions are located on the last page of this paperwork. If you have not heard from [place] regarding the results in 2 weeks, please contact this office.       I personally performed the services described in this documentation, which was scribed in my presence. The recorded information has been reviewed and considered for accuracy and completeness, addended by me as needed, and agree with information above.  Signed,   Merri Ray, MD [facility] [place].  02/20/18 9:07 AM

## 2018-02-20 NOTE — Patient Instructions (Addendum)
I would recommend eating earlier in the morning to see if that helps with weight.  Continue exercise. I will repeat the cholesterol tests today.  I will also test for HIV and hepatitis C as recommended.  I referred you to gastroenterology.  Let me know if there is a different practice that is requested.  Thank you for coming in today, and follow-up in 6 months for physical.  Thanks for coming in today.        IF you received an x-ray today, you will receive an invoice from [facility]. Please contact [facility] at [phone] with questions or concerns regarding your invoice.   IF you received labwork today, you will receive an invoice from [facility]. Please contact [facility] at [phone] with questions or concerns regarding your invoice.   Our billing staff will not be able to assist you with questions regarding bills from these companies.  You will be contacted with the lab results as soon as they are available. The fastest way to get your results is to activate your My Chart account. Instructions are located on the last page of this paperwork. If you have not heard from [place] regarding the results in 2 weeks, please contact this office.

## 2018-02-21 DIAGNOSIS — M25562 Pain in left knee: Secondary | ICD-10-CM | POA: Diagnosis not present

## 2018-02-21 DIAGNOSIS — M542 Cervicalgia: Secondary | ICD-10-CM | POA: Diagnosis not present

## 2018-02-21 DIAGNOSIS — M9901 Segmental and somatic dysfunction of cervical region: Secondary | ICD-10-CM | POA: Diagnosis not present

## 2018-02-21 DIAGNOSIS — M9906 Segmental and somatic dysfunction of lower extremity: Secondary | ICD-10-CM | POA: Diagnosis not present

## 2018-02-21 LAB — COMPREHENSIVE METABOLIC PANEL
A/G RATIO: 1.9 (ref 1.2–2.2)
ALK PHOS: 66 IU/L (ref 39–117)
ALT: 24 IU/L (ref 0–44)
AST: 26 IU/L (ref 0–40)
Albumin: 4.5 g/dL (ref 3.6–4.8)
BUN / CREAT RATIO: 14 (ref 10–24)
BUN: 19 mg/dL (ref 8–27)
Bilirubin Total: 0.3 mg/dL (ref 0.0–1.2)
CO2: 22 mmol/L (ref 20–29)
CREATININE: 1.38 mg/dL — AB (ref 0.76–1.27)
Calcium: 9.6 mg/dL (ref 8.6–10.2)
Chloride: 109 mmol/L — ABNORMAL HIGH (ref 96–106)
GFR calc Af Amer: 63 mL/min/{1.73_m2} (ref >59)
GFR calc non Af Amer: 54 mL/min/{1.73_m2} — ABNORMAL LOW (ref >59)
Globulin, Total: 2.4 g/dL (ref 1.5–4.5)
Glucose: 100 mg/dL — ABNORMAL HIGH (ref 65–99)
Potassium: 5.5 mmol/L — ABNORMAL HIGH (ref 3.5–5.2)
SODIUM: 146 mmol/L — AB (ref 134–144)
Total Protein: 6.9 g/dL (ref 6.0–8.5)

## 2018-02-21 LAB — LIPID PANEL
Chol/HDL Ratio: 5.2 ratio — ABNORMAL HIGH (ref 0.0–5.0)
Cholesterol, Total: 225 mg/dL — ABNORMAL HIGH (ref 100–199)
HDL: 43 mg/dL (ref >39)
LDL Calculated: 162 mg/dL — ABNORMAL HIGH (ref 0–99)
Triglycerides: 98 mg/dL (ref 0–149)
VLDL Cholesterol Cal: 20 mg/dL (ref 5–40)

## 2018-02-21 LAB — HIV ANTIBODY (ROUTINE TESTING W REFLEX): HIV Screen 4th Generation wRfx: NONREACTIVE

## 2018-02-21 LAB — HEPATITIS C ANTIBODY: Hep C Virus Ab: <0.1 s/co ratio (ref 0.0–0.9)

## 2018-02-24 DIAGNOSIS — M542 Cervicalgia: Secondary | ICD-10-CM | POA: Diagnosis not present

## 2018-02-24 DIAGNOSIS — M9901 Segmental and somatic dysfunction of cervical region: Secondary | ICD-10-CM | POA: Diagnosis not present

## 2018-02-24 DIAGNOSIS — M25562 Pain in left knee: Secondary | ICD-10-CM | POA: Diagnosis not present

## 2018-02-24 DIAGNOSIS — M9906 Segmental and somatic dysfunction of lower extremity: Secondary | ICD-10-CM | POA: Diagnosis not present

## 2018-02-28 DIAGNOSIS — M9901 Segmental and somatic dysfunction of cervical region: Secondary | ICD-10-CM | POA: Diagnosis not present

## 2018-02-28 DIAGNOSIS — M542 Cervicalgia: Secondary | ICD-10-CM | POA: Diagnosis not present

## 2018-02-28 DIAGNOSIS — M25562 Pain in left knee: Secondary | ICD-10-CM | POA: Diagnosis not present

## 2018-02-28 DIAGNOSIS — M9906 Segmental and somatic dysfunction of lower extremity: Secondary | ICD-10-CM | POA: Diagnosis not present

## 2018-03-03 DIAGNOSIS — M25562 Pain in left knee: Secondary | ICD-10-CM | POA: Diagnosis not present

## 2018-03-03 DIAGNOSIS — M9906 Segmental and somatic dysfunction of lower extremity: Secondary | ICD-10-CM | POA: Diagnosis not present

## 2018-03-03 DIAGNOSIS — M542 Cervicalgia: Secondary | ICD-10-CM | POA: Diagnosis not present

## 2018-03-03 DIAGNOSIS — M9901 Segmental and somatic dysfunction of cervical region: Secondary | ICD-10-CM | POA: Diagnosis not present

## 2018-03-04 DIAGNOSIS — G4733 Obstructive sleep apnea (adult) (pediatric): Secondary | ICD-10-CM | POA: Diagnosis not present

## 2018-03-06 DIAGNOSIS — M9906 Segmental and somatic dysfunction of lower extremity: Secondary | ICD-10-CM | POA: Diagnosis not present

## 2018-03-06 DIAGNOSIS — M25562 Pain in left knee: Secondary | ICD-10-CM | POA: Diagnosis not present

## 2018-03-06 DIAGNOSIS — M9901 Segmental and somatic dysfunction of cervical region: Secondary | ICD-10-CM | POA: Diagnosis not present

## 2018-03-06 DIAGNOSIS — M542 Cervicalgia: Secondary | ICD-10-CM | POA: Diagnosis not present

## 2018-03-07 ENCOUNTER — Encounter: Payer: Self-pay | Admitting: Nurse Practitioner

## 2018-03-08 ENCOUNTER — Other Ambulatory Visit: Payer: Self-pay | Admitting: Family Medicine

## 2018-03-08 DIAGNOSIS — R7989 Other specified abnormal findings of blood chemistry: Secondary | ICD-10-CM

## 2018-03-08 DIAGNOSIS — E870 Hyperosmolality and hypernatremia: Secondary | ICD-10-CM

## 2018-03-08 NOTE — Progress Notes (Signed)
Bmp - see labs

## 2018-03-09 ENCOUNTER — Encounter: Payer: Self-pay | Admitting: Family Medicine

## 2018-03-09 ENCOUNTER — Other Ambulatory Visit: Payer: BLUE CROSS/BLUE SHIELD

## 2018-03-09 ENCOUNTER — Other Ambulatory Visit: Payer: Self-pay | Admitting: Neurology

## 2018-03-09 DIAGNOSIS — G4733 Obstructive sleep apnea (adult) (pediatric): Secondary | ICD-10-CM

## 2018-03-09 DIAGNOSIS — G4719 Other hypersomnia: Secondary | ICD-10-CM

## 2018-03-09 DIAGNOSIS — Z9989 Dependence on other enabling machines and devices: Secondary | ICD-10-CM

## 2018-03-09 DIAGNOSIS — R0683 Snoring: Secondary | ICD-10-CM

## 2018-03-10 DIAGNOSIS — M542 Cervicalgia: Secondary | ICD-10-CM | POA: Diagnosis not present

## 2018-03-10 DIAGNOSIS — M25562 Pain in left knee: Secondary | ICD-10-CM | POA: Diagnosis not present

## 2018-03-10 DIAGNOSIS — M9906 Segmental and somatic dysfunction of lower extremity: Secondary | ICD-10-CM | POA: Diagnosis not present

## 2018-03-10 DIAGNOSIS — M9901 Segmental and somatic dysfunction of cervical region: Secondary | ICD-10-CM | POA: Diagnosis not present

## 2018-03-14 DIAGNOSIS — M542 Cervicalgia: Secondary | ICD-10-CM | POA: Diagnosis not present

## 2018-03-14 DIAGNOSIS — M9901 Segmental and somatic dysfunction of cervical region: Secondary | ICD-10-CM | POA: Diagnosis not present

## 2018-03-14 DIAGNOSIS — M25562 Pain in left knee: Secondary | ICD-10-CM | POA: Diagnosis not present

## 2018-03-14 DIAGNOSIS — M9906 Segmental and somatic dysfunction of lower extremity: Secondary | ICD-10-CM | POA: Diagnosis not present

## 2018-03-15 ENCOUNTER — Ambulatory Visit (AMBULATORY_SURGERY_CENTER): Payer: Self-pay | Admitting: *Deleted

## 2018-03-15 VITALS — Ht 72.0 in | Wt 252.0 lb

## 2018-03-15 DIAGNOSIS — Z1211 Encounter for screening for malignant neoplasm of colon: Secondary | ICD-10-CM

## 2018-03-15 MED ORDER — PLENVU 140 G PO SOLR: 1.0000 | Freq: Once | ORAL | 0 refills | Status: AC

## 2018-03-15 NOTE — Progress Notes (Signed)
No egg or soy allergy known to patient  No issues with past sedation with any surgeries  or procedures, no intubation problems  No diet pills per patient No home 02 use per patient  No blood thinners per patient  Pt denies issues with constipation  No A fib or A flutter  EMMI video sent to pt's e mail  

## 2018-03-17 DIAGNOSIS — M9901 Segmental and somatic dysfunction of cervical region: Secondary | ICD-10-CM | POA: Diagnosis not present

## 2018-03-17 DIAGNOSIS — M542 Cervicalgia: Secondary | ICD-10-CM | POA: Diagnosis not present

## 2018-03-17 DIAGNOSIS — M9906 Segmental and somatic dysfunction of lower extremity: Secondary | ICD-10-CM | POA: Diagnosis not present

## 2018-03-17 DIAGNOSIS — M25562 Pain in left knee: Secondary | ICD-10-CM | POA: Diagnosis not present

## 2018-03-21 DIAGNOSIS — M9906 Segmental and somatic dysfunction of lower extremity: Secondary | ICD-10-CM | POA: Diagnosis not present

## 2018-03-21 DIAGNOSIS — M25562 Pain in left knee: Secondary | ICD-10-CM | POA: Diagnosis not present

## 2018-03-21 DIAGNOSIS — M542 Cervicalgia: Secondary | ICD-10-CM | POA: Diagnosis not present

## 2018-03-21 DIAGNOSIS — M9901 Segmental and somatic dysfunction of cervical region: Secondary | ICD-10-CM | POA: Diagnosis not present

## 2018-03-24 DIAGNOSIS — M542 Cervicalgia: Secondary | ICD-10-CM | POA: Diagnosis not present

## 2018-03-24 DIAGNOSIS — M9901 Segmental and somatic dysfunction of cervical region: Secondary | ICD-10-CM | POA: Diagnosis not present

## 2018-03-24 DIAGNOSIS — M9906 Segmental and somatic dysfunction of lower extremity: Secondary | ICD-10-CM | POA: Diagnosis not present

## 2018-03-24 DIAGNOSIS — M25562 Pain in left knee: Secondary | ICD-10-CM | POA: Diagnosis not present

## 2018-03-28 DIAGNOSIS — M25562 Pain in left knee: Secondary | ICD-10-CM | POA: Diagnosis not present

## 2018-03-28 DIAGNOSIS — M9901 Segmental and somatic dysfunction of cervical region: Secondary | ICD-10-CM | POA: Diagnosis not present

## 2018-03-28 DIAGNOSIS — M542 Cervicalgia: Secondary | ICD-10-CM | POA: Diagnosis not present

## 2018-03-28 DIAGNOSIS — M9906 Segmental and somatic dysfunction of lower extremity: Secondary | ICD-10-CM | POA: Diagnosis not present

## 2018-03-30 DIAGNOSIS — M542 Cervicalgia: Secondary | ICD-10-CM | POA: Diagnosis not present

## 2018-03-30 DIAGNOSIS — M25562 Pain in left knee: Secondary | ICD-10-CM | POA: Diagnosis not present

## 2018-03-30 DIAGNOSIS — M9906 Segmental and somatic dysfunction of lower extremity: Secondary | ICD-10-CM | POA: Diagnosis not present

## 2018-03-30 DIAGNOSIS — M9901 Segmental and somatic dysfunction of cervical region: Secondary | ICD-10-CM | POA: Diagnosis not present

## 2018-04-04 DIAGNOSIS — M542 Cervicalgia: Secondary | ICD-10-CM | POA: Diagnosis not present

## 2018-04-04 DIAGNOSIS — M9901 Segmental and somatic dysfunction of cervical region: Secondary | ICD-10-CM | POA: Diagnosis not present

## 2018-04-04 DIAGNOSIS — M9906 Segmental and somatic dysfunction of lower extremity: Secondary | ICD-10-CM | POA: Diagnosis not present

## 2018-04-04 DIAGNOSIS — G4733 Obstructive sleep apnea (adult) (pediatric): Secondary | ICD-10-CM | POA: Diagnosis not present

## 2018-04-04 DIAGNOSIS — M25562 Pain in left knee: Secondary | ICD-10-CM | POA: Diagnosis not present

## 2018-04-06 ENCOUNTER — Encounter: Payer: Self-pay | Admitting: Gastroenterology

## 2018-04-07 DIAGNOSIS — M9901 Segmental and somatic dysfunction of cervical region: Secondary | ICD-10-CM | POA: Diagnosis not present

## 2018-04-07 DIAGNOSIS — M542 Cervicalgia: Secondary | ICD-10-CM | POA: Diagnosis not present

## 2018-04-07 DIAGNOSIS — M25562 Pain in left knee: Secondary | ICD-10-CM | POA: Diagnosis not present

## 2018-04-07 DIAGNOSIS — M9906 Segmental and somatic dysfunction of lower extremity: Secondary | ICD-10-CM | POA: Diagnosis not present

## 2018-04-11 DIAGNOSIS — M9901 Segmental and somatic dysfunction of cervical region: Secondary | ICD-10-CM | POA: Diagnosis not present

## 2018-04-11 DIAGNOSIS — M25562 Pain in left knee: Secondary | ICD-10-CM | POA: Diagnosis not present

## 2018-04-11 DIAGNOSIS — M542 Cervicalgia: Secondary | ICD-10-CM | POA: Diagnosis not present

## 2018-04-11 DIAGNOSIS — M9906 Segmental and somatic dysfunction of lower extremity: Secondary | ICD-10-CM | POA: Diagnosis not present

## 2018-04-14 ENCOUNTER — Encounter: Payer: Self-pay | Admitting: Gastroenterology

## 2018-04-14 ENCOUNTER — Ambulatory Visit (AMBULATORY_SURGERY_CENTER): Payer: BLUE CROSS/BLUE SHIELD | Admitting: Gastroenterology

## 2018-04-14 VITALS — BP 117/62 | HR 58 | Temp 99.1°F | Resp 11 | Ht 72.0 in | Wt 252.0 lb

## 2018-04-14 DIAGNOSIS — Z1211 Encounter for screening for malignant neoplasm of colon: Secondary | ICD-10-CM | POA: Diagnosis not present

## 2018-04-14 MED ORDER — SODIUM CHLORIDE 0.9 % IV SOLN: 500.0000 mL | Freq: Once | INTRAVENOUS | Status: DC

## 2018-04-14 NOTE — Patient Instructions (Signed)
YOU HAD AN ENDOSCOPIC PROCEDURE TODAY AT THE [facility]:   Refer to the procedure report that was given to you for any specific questions about what was found during the examination.  If the procedure report does not answer your questions, please call your gastroenterologist to clarify.  If you requested that your care partner not be given the details of your procedure findings, then the procedure report has been included in a sealed envelope for you to review at your convenience later.  YOU SHOULD EXPECT: Some feelings of bloating in the abdomen. Passage of more gas than usual.  Walking can help get rid of the air that was put into your GI tract during the procedure and reduce the bloating. If you had a lower endoscopy (such as a colonoscopy or flexible sigmoidoscopy) you may notice spotting of blood in your stool or on the toilet paper. If you underwent a bowel prep for your procedure, you may not have a normal bowel movement for a few days.  Please Note:  You might notice some irritation and congestion in your nose or some drainage.  This is from the oxygen used during your procedure.  There is no need for concern and it should clear up in a day or so.  SYMPTOMS TO REPORT IMMEDIATELY:   Following lower endoscopy (colonoscopy or flexible sigmoidoscopy):  Excessive amounts of blood in the stool  Significant tenderness or worsening of abdominal pains  Swelling of the abdomen that is new, acute  Fever of 100F or higher  For urgent or emergent issues, a gastroenterologist can be reached at any hour by calling [phone].   DIET:  We do recommend a small meal at first, but then you may proceed to your regular diet.  Drink plenty of fluids but you should avoid alcoholic beverages for 24 hours.  MEDICATIONS: Continue present medications.  Please see handouts given to you by your recovery nurse.  ACTIVITY:  You should plan to take it easy for the rest of today and you should NOT  DRIVE or use heavy machinery until tomorrow (because of the sedation medicines used during the test).    FOLLOW UP: Our staff will call the number listed on your records the next business day following your procedure to check on you and address any questions or concerns that you may have regarding the information given to you following your procedure. If we do not reach you, we will leave a message.  However, if you are feeling well and you are not experiencing any problems, there is no need to return our call.  We will assume that you have returned to your regular daily activities without incident.  If any biopsies were taken you will be contacted by phone or by letter within the next 1-3 weeks.  Please call us at [phone] if you have not heard about the biopsies in 3 weeks.   Thank you for allowing us to provide for your healthcare needs today.   SIGNATURES/CONFIDENTIALITY: You and/or your care partner have signed paperwork which will be entered into your electronic medical record.  These signatures attest to the fact that that the information above on your After Visit Summary has been reviewed and is understood.  Full responsibility of the confidentiality of this discharge information lies with you and/or your care-partner. 

## 2018-04-14 NOTE — Progress Notes (Signed)
Report given to PACU, vss 

## 2018-04-14 NOTE — Op Note (Signed)
[place] Patient Name: George Garner Procedure Date: 04/14/2018 9:15 AM MRN: [id number] Endoscopist: Mallie Mussel L. Loletha Carrow , MD Age: 62 Referring MD:  Date of Birth: [birth year] Gender: Male Account #: [id number] Procedure:                Colonoscopy Indications:              Screening for colorectal malignant neoplasm (no                            polyps 10 years ago at another institution) Medicines:                Monitored Anesthesia Care Procedure:                Pre-Anesthesia Assessment:                           - Prior to the procedure, a History and Physical                            was performed, and patient medications and                            allergies were reviewed. The patient's tolerance of                            previous anesthesia was also reviewed. The risks                            and benefits of the procedure and the sedation                            options and risks were discussed with the patient.                            All questions were answered, and informed consent                            was obtained. Anticoagulants: The patient has taken                            aspirin. It was decided not to withhold this                            medication prior to the procedure. ASA Grade                            Assessment: II - A patient with mild systemic                            disease. After reviewing the risks and benefits,                            the patient was deemed in satisfactory condition to  undergo the procedure.                           After obtaining informed consent, the colonoscope                            was passed under direct vision. Throughout the                            procedure, the patient's blood pressure, pulse, and                            oxygen saturations were monitored continuously. The                            Colonoscope was introduced through the anus and                          advanced to the the cecum, identified by                            appendiceal orifice and ileocecal valve. The                            colonoscopy was performed without difficulty. The                            patient tolerated the procedure well. The quality                            of the bowel preparation was excellent. The                            ileocecal valve, appendiceal orifice, and rectum                            were photographed. The quality of the bowel                            preparation was evaluated using the BBPS [facility]                            Bowel Preparation Scale) with scores of: Right                            Colon = 3, Transverse Colon = 3 and Left Colon = 3                            (entire mucosa seen well with no residual staining,                            small fragments of stool or opaque liquid). The  total BBPS score equals 9. Scope In: 9:24:05 AM Scope Out: 9:38:12 AM Scope Withdrawal Time: 0 hours 9 minutes 6 seconds  Total Procedure Duration: 0 hours 14 minutes 7 seconds  Findings:                 The perianal and digital rectal examinations were                            normal.                           A few small-mouthed diverticula were found in the                            distal transverse colon.                           The exam was otherwise without abnormality on                            direct and retroflexion views. Complications:            No immediate complications. Estimated Blood Loss:     Estimated blood loss: none. Impression:               - Diverticulosis in the distal transverse colon.                           - The examination was otherwise normal on direct                            and retroflexion views.                           - No specimens collected. Recommendation:           - Patient has a contact number available for                             emergencies. The signs and symptoms of potential                            delayed complications were discussed with the                            patient. Return to normal activities tomorrow.                            Written discharge instructions were provided to the                            patient.                           - Resume previous diet.                           - Continue present medications.                           -  Repeat colonoscopy in 10 years for screening                            purposes. Dmani Mizer L. Loletha Carrow, MD 04/14/2018 9:42:08 AM This report has been signed electronically.

## 2018-04-17 ENCOUNTER — Telehealth: Payer: Self-pay | Admitting: *Deleted

## 2018-04-17 NOTE — Telephone Encounter (Signed)
  Follow up Call-  Call back number 04/14/2018  Post procedure Call Back phone  # [phone]  Permission to leave phone message Yes  Some recent data might be hidden     Patient questions:  Do you have a fever, pain , or abdominal swelling? No. Pain Score  0 *  Have you tolerated food without any problems? Yes.    Have you been able to return to your normal activities? Yes.    Do you have any questions about your discharge instructions: Diet   No. Medications  No. Follow up visit  No.  Do you have questions or concerns about your Care? No.  Actions: * If pain score is 4 or above: No action needed, pain <4.

## 2018-04-20 ENCOUNTER — Encounter: Payer: Self-pay | Admitting: Family Medicine

## 2018-04-20 ENCOUNTER — Encounter: Payer: Self-pay | Admitting: Neurology

## 2018-04-20 DIAGNOSIS — M542 Cervicalgia: Secondary | ICD-10-CM | POA: Diagnosis not present

## 2018-04-20 DIAGNOSIS — M9901 Segmental and somatic dysfunction of cervical region: Secondary | ICD-10-CM | POA: Diagnosis not present

## 2018-04-20 DIAGNOSIS — M25562 Pain in left knee: Secondary | ICD-10-CM | POA: Diagnosis not present

## 2018-04-20 DIAGNOSIS — M9906 Segmental and somatic dysfunction of lower extremity: Secondary | ICD-10-CM | POA: Diagnosis not present

## 2018-04-25 DIAGNOSIS — M9906 Segmental and somatic dysfunction of lower extremity: Secondary | ICD-10-CM | POA: Diagnosis not present

## 2018-04-25 DIAGNOSIS — M9901 Segmental and somatic dysfunction of cervical region: Secondary | ICD-10-CM | POA: Diagnosis not present

## 2018-04-25 DIAGNOSIS — M25562 Pain in left knee: Secondary | ICD-10-CM | POA: Diagnosis not present

## 2018-04-25 DIAGNOSIS — M542 Cervicalgia: Secondary | ICD-10-CM | POA: Diagnosis not present

## 2018-05-02 DIAGNOSIS — M542 Cervicalgia: Secondary | ICD-10-CM | POA: Diagnosis not present

## 2018-05-02 DIAGNOSIS — M9901 Segmental and somatic dysfunction of cervical region: Secondary | ICD-10-CM | POA: Diagnosis not present

## 2018-05-02 DIAGNOSIS — M25562 Pain in left knee: Secondary | ICD-10-CM | POA: Diagnosis not present

## 2018-05-02 DIAGNOSIS — M9906 Segmental and somatic dysfunction of lower extremity: Secondary | ICD-10-CM | POA: Diagnosis not present

## 2018-05-04 DIAGNOSIS — G4733 Obstructive sleep apnea (adult) (pediatric): Secondary | ICD-10-CM | POA: Diagnosis not present

## 2018-05-09 DIAGNOSIS — M25562 Pain in left knee: Secondary | ICD-10-CM | POA: Diagnosis not present

## 2018-05-09 DIAGNOSIS — M9901 Segmental and somatic dysfunction of cervical region: Secondary | ICD-10-CM | POA: Diagnosis not present

## 2018-05-09 DIAGNOSIS — M542 Cervicalgia: Secondary | ICD-10-CM | POA: Diagnosis not present

## 2018-05-09 DIAGNOSIS — M9906 Segmental and somatic dysfunction of lower extremity: Secondary | ICD-10-CM | POA: Diagnosis not present

## 2018-05-16 DIAGNOSIS — M9901 Segmental and somatic dysfunction of cervical region: Secondary | ICD-10-CM | POA: Diagnosis not present

## 2018-05-16 DIAGNOSIS — M9906 Segmental and somatic dysfunction of lower extremity: Secondary | ICD-10-CM | POA: Diagnosis not present

## 2018-05-16 DIAGNOSIS — M542 Cervicalgia: Secondary | ICD-10-CM | POA: Diagnosis not present

## 2018-05-16 DIAGNOSIS — M25562 Pain in left knee: Secondary | ICD-10-CM | POA: Diagnosis not present

## 2018-05-22 ENCOUNTER — Encounter: Payer: Self-pay | Admitting: Nurse Practitioner

## 2018-05-23 NOTE — Progress Notes (Signed)
[facility]  [facility]  PATIENT: George Garner DOB: [birth year]    REASON FOR VISIT: Follow-up for  obstructive sleep apnea with  CPAP compliance HISTORY FROM: Patient    HISTORY OF PRESENT ILLNESS:UPDATE 10/23/2019CM George Garner, 62 year old male returns for follow-up with history of obstructive sleep apnea here for CPAP compliance.  Patient claims he has used 8  different masks in an attempt to get one that does not leak.  Compliance data dated 04/23/2018-05/22/2018 shows compliance greater than 4 hours at 97%.  Average usage 6 hours 58 minutes.  Set pressure 7 cm EPR level 1 AHI 1.4.  Even though compliance data is excellent patient continues to complain with significant fatigue and waking up at 2 to 3:00 in the morning and unable to go back to sleep.  ESS 9.  FSS 45 which is elevated.  Most recent thyroid labs in 2017 within normal limits.Patient denies any narcolepsy or cataplexy events.  In the past Dr. Brett Garner had wanted him to get tested for narcolepsy due to his excessive fatigue however due to the cost he did not think it was necessary.  He returns for reevaluation UPDATE 7/2/2019CM. George Garner, 62 year old male returns for follow-up with obstructive sleep apnea here for initial CPAP.  He continues to significant drowsiness.  ESS dropped from 17-15 with treatment.  Data 12/31/2017-01/29/2018 shows compliance greater than 4 hours at 100%.  Average usage 7 hours 4 minutes.  Set pressure 7 cm.  EPR level 1 leak 95th percentile22.6.  AHI 0.7 He returns for reevaluation.    2/27/19CDMr. Garner by his own recollection had a sleep study probably between 2007 and 2009 ,  He has owned the same CPAP machine ever since, but quit using it after losing 35 pounds or more. Now he cannot get new supplies, but all symptoms have returned, he is snoring again.  He carries a diagnosis of obstructive sleep apnea on CPAP, respiratory allergies, taking Flonase and Zyrtec for those, hyperlipidemia GERD and benign  prostate hyperplasia.  He is up-to-date with his influenza vaccinations, received itching shingles vaccine in 2013, has regular PSA scores.   Lipid is elevated still cholesterol at 254 total, and he lost was seen at [facility] urgent care following an acute upper respiratory infection in 2018 months, he also had a full physical in January of this year.    Chief complaint according to patient : " I may need CPAP again"    Sleep habits are as follows: The patient usually watches TV for the last hour before he retreats to the bedroom, which is around 10 PM.  He has no trouble initiating sleep.  The bedroom is cool, quiet and dark.  He prefers to sleep on his side, on one firm pillow.  The bed has a flat mattress and is not adjustable.  He usually can sleep for several hours before he is woken by the urge to urinate around 3 AM, he may have 1-3 times  nocturia as each night. Rises at 6 .25 AM- he sleeps otherwise through.      REVIEW OF SYSTEMS: Full 14 system review of systems performed and notable only for those listed, all others are neg:  Constitutional: neg  Cardiovascular: neg Ear/Nose/Throat: neg  Skin: neg Eyes: neg Respiratory: neg Gastroitestinal: Urinary frequency Hematology/Lymphatic: neg  Endocrine: neg Musculoskeletal:neg Allergy/Immunology: neg Neurological: neg Psychiatric: neg Sleep : Obstructive sleep apnea with CPAP, continue daytime drowsiness, excessive fatigue, frequent waking   ALLERGIES: Allergies  Allergen Reactions  . Penicillins Rash  HOME MEDICATIONS: Outpatient Medications Prior to Visit  Medication Sig Dispense Refill  . aspirin 81 MG tablet Take 81 mg by mouth daily.    . cetirizine (ZYRTEC) 10 MG tablet Take 1 tablet (10 mg total) by mouth daily. 30 tablet 1  . fluticasone (FLONASE) 50 MCG/ACT nasal spray Place 2 sprays into both nostrils daily. 16 g 1  . Multiple Vitamin (MULTIVITAMIN) tablet Take 1 tablet by mouth daily.      Facility-Administered Medications Prior to Visit  Medication Dose Route Frequency Provider Last Rate Last Dose  . 0.9 %  sodium chloride infusion  500 mL Intravenous Once Doran Stabler, MD        PAST MEDICAL HISTORY: Past Medical History:  Diagnosis Date  . Allergy    Zyrtec daily.  Flonase PRN.  . BPH (benign prostatic hyperplasia)   . Dyslipidemia   . Erectile dysfunction   . GERD (gastroesophageal reflux disease)   . Hyperlipidemia   . Hypogonadism male   . Obstructive sleep apnea   . OSA on CPAP   . Sleep apnea     PAST SURGICAL HISTORY: Past Surgical History:  Procedure Laterality Date  . CHOLECYSTECTOMY    . COLONOSCOPY    . deviated septum-cyst    . EYE SURGERY     L eye tumor benign.  Marland Kitchen GALLBLADDER SURGERY    . PILONIDAL CYST EXCISION    . VASECTOMY      FAMILY HISTORY: Family History  Problem Relation Age of Onset  . Diabetes Mother   . Dementia Mother   . Arthritis Brother   . Colon cancer Neg Hx   . Colon polyps Neg Hx   . Esophageal cancer Neg Hx   . Stomach cancer Neg Hx   . Rectal cancer Neg Hx     SOCIAL HISTORY: Social History   Socioeconomic History  . Marital status: Married    Spouse name: Not on file  . Number of children: Not on file  . Years of education: Not on file  . Highest education level: Not on file  Occupational History  . Occupation: Best boy: [employer]  Social Needs  . Financial resource strain: Not on file  . Food insecurity:    Worry: Not on file    Inability: Not on file  . Transportation needs:    Medical: Not on file    Non-medical: Not on file  Tobacco Use  . Smoking status: Former Smoker    Last attempt to quit: 2002    Years since quitting: 17.8  . Smokeless tobacco: Never Used  Substance and Sexual Activity  . Alcohol use: Yes    Alcohol/week: 4.0 standard drinks    Types: 4 Cans of beer per week  . Drug use: No  . Sexual activity: Yes    Birth  control/protection: Other-see comments    Comment: vasectomy  Lifestyle  . Physical activity:    Days per week: Not on file    Minutes per session: Not on file  . Stress: Not on file  Relationships  . Social connections:    Talks on phone: Not on file    Gets together: Not on file    Attends religious service: Not on file    Active member of club or organization: Not on file    Attends meetings of clubs or organizations: Not on file    Relationship status: Not on file  . Intimate partner violence:  Fear of current or ex partner: Not on file    Emotionally abused: Not on file    Physically abused: Not on file    Forced sexual activity: Not on file  Other Topics Concern  . Not on file  Social History Narrative   Marital status: married x 26 years.      Children: 3 children (23, 21, 16); no grandchildren.      Lives: with wife, 1 child at home       Employment: [employer] x 13 years.      Tobacco: quit smoking 15 years ago.      Alcohol: rarely      Exercise:  3 times per week (weights, cardio).      Seatbelt: 100%      Guns: none     PHYSICAL EXAM  Vitals:   05/24/18 0738  BP: 134/75  Pulse: 69  Weight: 249 lb 3.2 oz (113 kg)  Height: 6' (1.829 m)   Body mass index is 33.8 kg/m.  Generalized: Well developed, obese male in no acute distress  Head: normocephalic and atraumatic,. Oropharynx benign mallopatti 2  Neck: Supple, circumference 19 Lungs clear Skin no peripheral edema Musculoskeletal: No deformity   Neurological examination   Mentation: Alert oriented to time, place, history taking. Attention span and concentration appropriate. Recent and remote memory intact.  Follows all commands speech and language fluent. ESS 9, FSS 45 which is elevated.   Cranial nerve II-XII: Pupils were equal round reactive to light extraocular movements were full, visual field were full on confrontational test. Facial sensation and strength were normal.  hearing was intact to finger rubbing bilaterally. Uvula tongue midline. head turning and shoulder shrug were normal and symmetric.Tongue protrusion into cheek strength was normal. Motor: normal bulk and tone, full strength in the BUE, BLE,   Sensory: normal and symmetric to light touch,  Coordination: finger-nose-finger, heel-to-shin bilaterally, no dysmetria Gait and Station: Rising up from seated position without assistance, normal stance,  moderate stride, good arm swing, smooth turning, able to perform tiptoe, and heel walking without difficulty. Tandem gait is steady  DIAGNOSTIC DATA (LABS, IMAGING, TESTING) - I reviewed patient records, labs, notes, testing and imaging myself where available.  Lab Results  Component Value Date   WBC 8.1 07/02/2016   HGB 14.9 07/02/2016   HCT 43.4 07/02/2016   MCV 88.8 07/02/2016   PLT 192 07/02/2016      Component Value Date/Time   NA 146 (H) 02/20/2018 0920   K 5.5 (H) 02/20/2018 0920   CL 109 (H) 02/20/2018 0920   CO2 22 02/20/2018 0920   GLUCOSE 100 (H) 02/20/2018 0920   GLUCOSE 87 07/02/2016 1650   BUN 19 02/20/2018 0920   CREATININE 1.38 (H) 02/20/2018 0920   CREATININE 1.13 07/02/2016 1650   CALCIUM 9.6 02/20/2018 0920   PROT 6.9 02/20/2018 0920   ALBUMIN 4.5 02/20/2018 0920   AST 26 02/20/2018 0920   ALT 24 02/20/2018 0920   ALKPHOS 66 02/20/2018 0920   BILITOT 0.3 02/20/2018 0920   GFRNONAA 54 (L) 02/20/2018 0920   GFRNONAA 70 07/02/2016 1650   GFRAA 63 02/20/2018 0920   GFRAA 81 07/02/2016 1650   Lab Results  Component Value Date   CHOL 225 (H) 02/20/2018   HDL 43 02/20/2018   LDLCALC 162 (H) 02/20/2018   TRIG 98 02/20/2018   CHOLHDL 5.2 (H) 02/20/2018   Lab Results  Component Value Date   HGBA1C 5.6 06/23/2015  No results found for: VITAMINB12 Lab Results  Component Value Date   TSH 1.70 07/02/2016      ASSESSMENT AND PLAN  62 y.o. year old male here to follow-up for obstructive sleep apnea here for CPAP  compliance.Data dated 04/23/2018-05/22/2018 shows compliance greater than 4 hours at 97%.  Average usage 6 hours 58 minutes.  Set pressure 7 cm EPR level 1 AHI 1.4.  ESS 9 FSS 45 which is elevated.  Patient denies cataplexy or narcolepsy events.  He claims he continues to wake up between 2 and 3:00 each morning and cannot go back to sleep.  Last TSH in 2017    CPAP compliance 97% Continue same settings Discussed excessive daytime drowsiness and fatigue with Dr. Brett Garner who recommends low dose Zoloft.  Early morning awakenings generally are a sign of anxiety,  Depression. May want to recheck TSH (thyroid) with PCP  as it was last done in 2017 Follow-up yearly and prn George Garner, [facility], [facility], APRN  [facility] [address], [place], [place] [address] [phone]

## 2018-05-24 ENCOUNTER — Telehealth: Payer: Self-pay

## 2018-05-24 ENCOUNTER — Encounter: Payer: Self-pay | Admitting: Family Medicine

## 2018-05-24 ENCOUNTER — Encounter: Payer: Self-pay | Admitting: Nurse Practitioner

## 2018-05-24 ENCOUNTER — Ambulatory Visit: Payer: BLUE CROSS/BLUE SHIELD | Admitting: Nurse Practitioner

## 2018-05-24 VITALS — BP 134/75 | HR 69 | Ht 72.0 in | Wt 249.2 lb

## 2018-05-24 DIAGNOSIS — R400 Somnolence: Secondary | ICD-10-CM | POA: Diagnosis not present

## 2018-05-24 DIAGNOSIS — G4733 Obstructive sleep apnea (adult) (pediatric): Secondary | ICD-10-CM | POA: Diagnosis not present

## 2018-05-24 DIAGNOSIS — Z9989 Dependence on other enabling machines and devices: Secondary | ICD-10-CM

## 2018-05-24 DIAGNOSIS — R5383 Other fatigue: Secondary | ICD-10-CM

## 2018-05-24 MED ORDER — SERTRALINE HCL 25 MG PO TABS: 25.0000 mg | ORAL_TABLET | Freq: Every day | ORAL | 6 refills | Status: DC

## 2018-05-24 NOTE — Telephone Encounter (Signed)
Spoke with [name] and he verbalized understanding instructions given about starting a low dose of Zoloft and having his thyroid check by his PCP. Patient is aware that Zoloft has been called into his pharmacy. No questions or concerns at this time.

## 2018-05-24 NOTE — Telephone Encounter (Signed)
-----   Message from Dennie Bible, NP sent at 05/24/2018 11:15 AM EDT ----- Please let pt know I talked to Dr. Brett Fairy about his excessive fatigue, daytime drowsiness despite having good compliance on CPAP.  She recommends low-dose Zoloft as early morning awakenings can be anxiety or depression.  It is also noted your last thyroid test was in 2017.  You may want to get that rechecked by your primary care.  Med was called into your local pharmacy

## 2018-05-24 NOTE — Patient Instructions (Signed)
CPAP compliance 97% Continue same settings Follow-up yearly

## 2018-06-04 DIAGNOSIS — G4733 Obstructive sleep apnea (adult) (pediatric): Secondary | ICD-10-CM | POA: Diagnosis not present

## 2018-07-04 DIAGNOSIS — G4733 Obstructive sleep apnea (adult) (pediatric): Secondary | ICD-10-CM | POA: Diagnosis not present

## 2018-08-04 DIAGNOSIS — G4733 Obstructive sleep apnea (adult) (pediatric): Secondary | ICD-10-CM | POA: Diagnosis not present

## 2018-08-20 ENCOUNTER — Other Ambulatory Visit: Payer: Self-pay | Admitting: Nurse Practitioner

## 2018-08-21 NOTE — Telephone Encounter (Signed)
Received request for 90 day Rx for Zoloft. Gave 90 day supply, no refills. Zoloft was 6 month Rx. Note to pharmacy: patient must schedule FU.

## 2018-08-25 DIAGNOSIS — H903 Sensorineural hearing loss, bilateral: Secondary | ICD-10-CM | POA: Diagnosis not present

## 2018-09-04 DIAGNOSIS — G4733 Obstructive sleep apnea (adult) (pediatric): Secondary | ICD-10-CM | POA: Diagnosis not present

## 2018-09-18 ENCOUNTER — Telehealth: Payer: Self-pay | Admitting: Family Medicine

## 2018-09-18 NOTE — Telephone Encounter (Signed)
LVM for pt to call office and reschedule. Due to Dr. Carlota Raspberry being out of the office, pt will need to be reschedule. When pt calls back, please reschedule appt form 09/29/18 at their convenience. Thank you!

## 2018-09-29 ENCOUNTER — Encounter: Payer: BLUE CROSS/BLUE SHIELD | Admitting: Family Medicine

## 2018-11-16 ENCOUNTER — Other Ambulatory Visit: Payer: Self-pay | Admitting: *Deleted

## 2018-11-16 MED ORDER — SERTRALINE HCL 25 MG PO TABS: 25.0000 mg | ORAL_TABLET | Freq: Every day | ORAL | 0 refills | Status: DC

## 2018-11-28 DIAGNOSIS — G4733 Obstructive sleep apnea (adult) (pediatric): Secondary | ICD-10-CM | POA: Diagnosis not present

## 2019-01-08 ENCOUNTER — Encounter: Payer: Self-pay | Admitting: Family Medicine

## 2019-01-08 ENCOUNTER — Ambulatory Visit (INDEPENDENT_AMBULATORY_CARE_PROVIDER_SITE_OTHER): Payer: BC Managed Care – PPO | Admitting: Family Medicine

## 2019-01-08 ENCOUNTER — Other Ambulatory Visit: Payer: Self-pay

## 2019-01-08 VITALS — Temp 98.5°F | Resp 16 | Ht 72.0 in | Wt 257.0 lb

## 2019-01-08 DIAGNOSIS — S46312A Strain of muscle, fascia and tendon of triceps, left arm, initial encounter: Secondary | ICD-10-CM | POA: Diagnosis not present

## 2019-01-08 DIAGNOSIS — R42 Dizziness and giddiness: Secondary | ICD-10-CM

## 2019-01-08 DIAGNOSIS — R29898 Other symptoms and signs involving the musculoskeletal system: Secondary | ICD-10-CM

## 2019-01-08 DIAGNOSIS — E785 Hyperlipidemia, unspecified: Secondary | ICD-10-CM | POA: Diagnosis not present

## 2019-01-08 LAB — LIPID PANEL

## 2019-01-08 NOTE — Patient Instructions (Addendum)
If you have lab work done today you will be contacted with your lab results within the next 2 weeks.  If you have not heard from [place] then please contact us. The fastest way to get your results is to register for My Chart.   IF you received an x-ray today, you will receive an invoice from [facility]. Please contact [facility] at [phone] with questions or concerns regarding your invoice.   IF you received labwork today, you will receive an invoice from [place]. Please contact [facility] at [phone] with questions or concerns regarding your invoice.   Our billing staff will not be able to assist you with questions regarding bills from these companies.  You will be contacted with the lab results as soon as they are available. The fastest way to get your results is to activate your My Chart account. Instructions are located on the last page of this paperwork. If you have not heard from [place] regarding the results in 2 weeks, please contact this office.      Transient Ischemic Attack A transient ischemic attack (TIA) is a "warning stroke" that causes stroke-like symptoms that go away quickly. A TIA does not cause lasting damage to the brain. But having a TIA is a sign that you may be at risk for a stroke. Lifestyle changes and medical treatments can help prevent a stroke. It is important to know the symptoms of a TIA and what to do. Get help right away, even if your symptoms go away. The symptoms of a TIA are the same as those of a stroke. They can happen fast, and they usually go away within minutes or hours. They can include:  Weakness or loss of feeling in your face, arm, or leg. This often happens on one side of your body.  Trouble walking.  Trouble moving your arms or legs.  Trouble talking or understanding what people are saying.  Trouble seeing.  Seeing two of one object (double vision).  Feeling dizzy.  Feeling confused.  Loss of balance or  coordination.  Feeling sick to your stomach (nauseous) and throwing up (vomiting).  A very bad headache for no reason.  What increases the risk? Certain things may make you more likely to have a TIA. Some of these are things that you can change, such as:  Being very overweight (obese).  Using products that contain nicotine or tobacco, such as cigarettes and e-cigarettes.  Taking birth control pills.  Not being active.  Drinking too much alcohol.  Using drugs. Other risk factors include:  Having an irregular heartbeat (atrial fibrillation).  Being African American or Hispanic.  Having had blood clots, stroke, TIA, or heart attack in the past.  Being a woman with a history of high blood pressure in pregnancy (preeclampsia).  Being over the age of 63.  Being male.  Having family history of stroke.  Having the following diseases or conditions: ? High blood pressure. ? High cholesterol. ? Diabetes. ? Heart disease. ? Sickle cell disease. ? Sleep apnea. ? Migraine headache. ? Long-term (chronic) diseases that cause soreness and swelling (inflammation). ? Disorders that affect how your blood clots. Follow these instructions at home: Medicines   Take over-the-counter and prescription medicines only as told by your doctor.  If you were told to take aspirin or another medicine to thin your blood, take it exactly as told by your doctor. ? Taking too much of the medicine can cause bleeding. ? Taking too little  of the medicine may not work to treat the problem. Eating and drinking   Eat 5 or more servings of fruits and vegetables each day.  Follow instructions from your doctor about your diet. You may need to follow a certain diet to help lower your risk of having a stroke. You may need to: ? Eat a diet that is low in fat and salt. ? Eat foods that contain a lot of fiber. ? Limit the amount of carbohydrates and sugar in your diet.  Limit alcohol intake to 1 drink a  day for nonpregnant women and 2 drinks a day for men. One drink equals 12 oz of beer, 5 oz of wine, or 1 oz of hard liquor. General instructions  Keep a healthy weight.  Stay active. Try to get at least 30 minutes of activity on all or most days.  Find out if you have a condition called sleep apnea. Get treatment if needed.  Do not use any products that contain nicotine or tobacco, such as cigarettes and e-cigarettes. If you need help quitting, ask your doctor.  Do not abuse drugs.  Keep all follow-up visits as told by your doctor. This is important. Get help right away if:  You have any signs of stroke. "BE FAST" is an easy way to remember the main warning signs: ? B - Balance. Signs are dizziness, sudden trouble walking, or loss of balance. ? E - Eyes. Signs are trouble seeing or a sudden change in how you see. ? F - Face. Signs are sudden weakness or loss of feeling of the face, or the face or eyelid drooping on one side. ? A - Arms. Signs are weakness or loss of feeling in an arm. This happens suddenly and usually on one side of the body. ? S - Speech. Signs are sudden trouble speaking, slurred speech, or trouble understanding what people say. ? T - Time. Time to call emergency services. Write down what time symptoms started.  You have other signs of stroke, such as: ? A sudden, very bad headache with no known cause. ? Feeling sick to your stomach (nausea). ? Throwing up (vomiting). ? Jerky movements that you cannot control (seizure). These symptoms may be an emergency. Do not wait to see if the symptoms will go away. Get medical help right away. Call your local emergency services (911 in the [place] Do not drive yourself to the hospital. Summary  A transient ischemic attack (TIA) is a "warning stroke" that causes stroke-like symptoms that go away quickly.  A TIA is a medical emergency. Get help right away, even if your symptoms go away.  A TIA does not cause lasting damage to  the brain.  Having a TIA is a sign that you may be at risk for a stroke. Lifestyle changes and medical treatments can help prevent a stroke. This information is not intended to replace advice given to you by your health care provider. Make sure you discuss any questions you have with your health care provider. Document Released: 04/27/2008 Document Revised: 01/24/2018 Document Reviewed: 10/20/2016 Elsevier Interactive Patient Education  2019 [employer].

## 2019-01-08 NOTE — Progress Notes (Signed)
6/8/20208:21 AM  George Garner 17-Oct-1955, 63 y.o., male [id number]  Chief Complaint  Patient presents with  . Dizziness    3 days ago. But lightheadness is better today. in the am when try to get up right leg seems to want to give out on me.,  . Arm Pain    left arm sorness, been going on for 3 weeks    HPI:   Patient is a 63 y.o. male with past medical history significant for HLP, OSA on cpap and depression who presents today for dizziness and left arm pain  For the past 3 months has been having intermittent episodes of waking up with right leg feeling numb and weeks, lasts several days in a row and then resolves, dysfunction only lasts for couple of hours at a time, denies any known sciatica or DDD/chronic back pain, denies any associated RUE sx, denies any changes in bowel or bladder function, denies any vision changes Former smoker Takes ASA daily   3 days woke up feeling "drunk", nausea, room spinning, today woke up feeling much better, just had minimal issues with room spinning, denies any hearing changes, uses hearing aides. Denies any chest pain, palpitations, SOB Denies any fever or chills Had diarrhea about a week ago Denies any dysuria or hematuria  Left triceps soreness x 3 weeks Denies any trauma but has been pulling ivy down Cant lift/move arm really well, has been taking APAP Denies any previous injuries Denies any neck pain, numbness or tingling  Fall Risk  01/08/2019 02/20/2018 01/31/2018 08/26/2017 02/09/2017  Falls in the past year? 0 No No No No  Number falls in past yr: 0 - - - -  Injury with Fall? 0 - - - -  Follow up Falls evaluation completed - - - -     Depression screen [facility] 2/9 01/08/2019 02/20/2018 11/29/2017  Decreased Interest 0 0 0  Down, Depressed, Hopeless 1 0 0  PHQ - 2 Score 1 0 0    Allergies  Allergen Reactions  . Penicillins Rash    Prior to Admission medications   Medication Sig Start Date End Date Taking? Authorizing Provider  aspirin 81  MG tablet Take 81 mg by mouth daily.   Yes [provider]  cetirizine (ZYRTEC) 10 MG tablet Take 1 tablet (10 mg total) by mouth daily. 12/10/16  Yes McVey, Gelene Mink, PA-C  fluticasone (FLONASE) 50 MCG/ACT nasal spray Place 2 sprays into both nostrils daily. 12/10/16  Yes McVey, Gelene Mink, PA-C  Multiple Vitamin (MULTIVITAMIN) tablet Take 1 tablet by mouth daily.   Yes [provider]  sertraline (ZOLOFT) 25 MG tablet Take 1 tablet (25 mg total) by mouth daily. 11/16/18  Yes Kathrynn Ducking, MD    Past Medical History:  Diagnosis Date  . Allergy    Zyrtec daily.  Flonase PRN.  . BPH (benign prostatic hyperplasia)   . Dyslipidemia   . Erectile dysfunction   . GERD (gastroesophageal reflux disease)   . Hyperlipidemia   . Hypogonadism male   . Obstructive sleep apnea   . OSA on CPAP   . Sleep apnea     Past Surgical History:  Procedure Laterality Date  . CHOLECYSTECTOMY    . COLONOSCOPY    . deviated septum-cyst    . EYE SURGERY     L eye tumor benign.  Marland Kitchen GALLBLADDER SURGERY    . PILONIDAL CYST EXCISION    . VASECTOMY      Social History  Tobacco Use  . Smoking status: Former Smoker    Last attempt to quit: 2002    Years since quitting: 18.4  . Smokeless tobacco: Never Used  Substance Use Topics  . Alcohol use: Yes    Alcohol/week: 4.0 standard drinks    Types: 4 Cans of beer per week    Family History  Problem Relation Age of Onset  . Diabetes Mother   . Dementia Mother   . Arthritis Brother   . Colon cancer Neg Hx   . Colon polyps Neg Hx   . Esophageal cancer Neg Hx   . Stomach cancer Neg Hx   . Rectal cancer Neg Hx     ROS Per hpi  OBJECTIVE:  Today's Vitals   01/08/19 0804  Resp: 16  Temp: 98.5 F (36.9 C)  TempSrc: Oral  SpO2: 96%  Weight: 257 lb (116.6 kg)  Height: 6' (1.829 m)   There is no height or weight on file to calculate BMI.  Orthostatic VS for the past 24 hrs (Last 3 readings):  BP- Lying  Pulse- Lying BP- Standing at 0 minutes Pulse- Standing at 0 minutes BP- Standing at 3 minutes Pulse- Standing at 3 minutes  01/08/19 0823 153/83 68 (!) 155/92 81 146/87 74   BP Readings from Last 3 Encounters:  05/24/18 134/75  04/14/18 117/62  02/20/18 136/70    Physical Exam Vitals signs and nursing note reviewed.  Constitutional:      General: He is not in acute distress.    Appearance: He is well-developed.  HENT:     Head: Normocephalic and atraumatic.     Right Ear: Hearing, tympanic membrane, ear canal and external ear normal.     Left Ear: Hearing, tympanic membrane, ear canal and external ear normal.     Mouth/Throat:     Pharynx: No oropharyngeal exudate.  Eyes:     Extraocular Movements: Extraocular movements intact.     Right eye: No nystagmus.     Left eye: No nystagmus.     Conjunctiva/sclera: Conjunctivae normal.     Pupils: Pupils are equal, round, and reactive to light.  Neck:     Musculoskeletal: Neck supple. No spinous process tenderness or muscular tenderness.     Vascular: No carotid bruit.  Cardiovascular:     Rate and Rhythm: Normal rate and regular rhythm.     Pulses: Normal pulses.     Heart sounds: Normal heart sounds. No murmur. No friction rub. No gallop.   Pulmonary:     Effort: Pulmonary effort is normal.     Breath sounds: Normal breath sounds. No wheezing or rales.  Musculoskeletal:     Left shoulder: Normal.     Left upper arm: He exhibits tenderness (over triceps body). He exhibits no bony tenderness and no swelling.     Right lower leg: No edema.     Left lower leg: No edema.  Lymphadenopathy:     Cervical: No cervical adenopathy.  Skin:    General: Skin is warm and dry.  Neurological:     Mental Status: He is alert and oriented to person, place, and time.     Cranial Nerves: Cranial nerves are intact.     Sensory: Sensation is intact.     Motor: No weakness or pronator drift.     Coordination: Romberg sign negative. Rapid  alternating movements normal.     Deep Tendon Reflexes: Reflexes are normal and symmetric.     Comments: Unable to toe  or heel walk    My interpretation of EKG:  NSR, HR 70, no ST changes  ASSESSMENT and PLAN  1. Transient right leg weakness Discussed ddx, r/o TIA first. Studies and labs ordered. ER precautions given. Consider neuro referral.  - EKG 12-Lead - MR Brain W Wo Contrast; Future - US Carotid Duplex Bilateral; Future  2. Vertigo Resolving.  - CBC - Comprehensive metabolic panel - Urinalysis, Routine w reflex microscopic - MR Brain W Wo Contrast; Future  3. Hyperlipidemia, unspecified hyperlipidemia type Labs pending. - Lipid panel - TSH  4. Strain of left triceps, initial encounter Discussed conservative measures.  Return for after studies.    Rutherford Guys, MD Primary Care at [place], [place] [address] Ph.  [phone] Fax [phone]

## 2019-01-09 LAB — CBC
Hematocrit: 44.7 % (ref 37.5–51.0)
Hemoglobin: 14.8 g/dL (ref 13.0–17.7)
MCH: 30.2 pg (ref 26.6–33.0)
MCHC: 33.1 g/dL (ref 31.5–35.7)
MCV: 91 fL (ref 79–97)
Platelets: 204 10*3/uL (ref 150–450)
RBC: 4.9 x10E6/uL (ref 4.14–5.80)
RDW: 13.2 % (ref 11.6–15.4)
WBC: 6.4 10*3/uL (ref 3.4–10.8)

## 2019-01-09 LAB — URINALYSIS, ROUTINE W REFLEX MICROSCOPIC
Bilirubin, UA: NEGATIVE
Glucose, UA: NEGATIVE
Ketones, UA: NEGATIVE
Leukocytes,UA: NEGATIVE
Nitrite, UA: NEGATIVE
Protein,UA: NEGATIVE
RBC, UA: NEGATIVE
Specific Gravity, UA: 1.011 (ref 1.005–1.030)
Urobilinogen, Ur: 0.2 mg/dL (ref 0.2–1.0)
pH, UA: 5.5 (ref 5.0–7.5)

## 2019-01-09 LAB — COMPREHENSIVE METABOLIC PANEL
ALT: 32 IU/L (ref 0–44)
AST: 23 IU/L (ref 0–40)
Albumin/Globulin Ratio: 1.8 (ref 1.2–2.2)
Albumin: 4.3 g/dL (ref 3.8–4.8)
Alkaline Phosphatase: 65 IU/L (ref 39–117)
BUN/Creatinine Ratio: 13 (ref 10–24)
BUN: 18 mg/dL (ref 8–27)
Bilirubin Total: 0.3 mg/dL (ref 0.0–1.2)
CO2: 22 mmol/L (ref 20–29)
Calcium: 9.4 mg/dL (ref 8.6–10.2)
Chloride: 102 mmol/L (ref 96–106)
Creatinine, Ser: 1.35 mg/dL — ABNORMAL HIGH (ref 0.76–1.27)
GFR calc Af Amer: 64 mL/min/{1.73_m2} (ref >59)
GFR calc non Af Amer: 55 mL/min/{1.73_m2} — ABNORMAL LOW (ref >59)
Globulin, Total: 2.4 g/dL (ref 1.5–4.5)
Glucose: 110 mg/dL — ABNORMAL HIGH (ref 65–99)
Potassium: 4.6 mmol/L (ref 3.5–5.2)
Sodium: 139 mmol/L (ref 134–144)
Total Protein: 6.7 g/dL (ref 6.0–8.5)

## 2019-01-09 LAB — LIPID PANEL
Chol/HDL Ratio: 5.8 ratio — ABNORMAL HIGH (ref 0.0–5.0)
Cholesterol, Total: 216 mg/dL — ABNORMAL HIGH (ref 100–199)
HDL: 37 mg/dL — ABNORMAL LOW (ref >39)
LDL Calculated: 144 mg/dL — ABNORMAL HIGH (ref 0–99)
Triglycerides: 173 mg/dL — ABNORMAL HIGH (ref 0–149)
VLDL Cholesterol Cal: 35 mg/dL (ref 5–40)

## 2019-01-09 LAB — TSH: TSH: 2.54 u[IU]/mL (ref 0.450–4.500)

## 2019-01-12 ENCOUNTER — Encounter: Payer: Self-pay | Admitting: Family Medicine

## 2019-02-01 ENCOUNTER — Other Ambulatory Visit: Payer: Self-pay | Admitting: Family Medicine

## 2019-02-06 ENCOUNTER — Other Ambulatory Visit: Payer: Self-pay

## 2019-02-06 ENCOUNTER — Ambulatory Visit
Admission: RE | Admit: 2019-02-06 | Discharge: 2019-02-06 | Disposition: A | Discharge status: Home / Self Care | Payer: Self-pay | Source: Ambulatory Visit | Attending: Family Medicine | Admitting: Family Medicine

## 2019-02-06 ENCOUNTER — Ambulatory Visit
Admission: RE | Admit: 2019-02-06 | Discharge: 2019-02-06 | Disposition: A | Discharge status: Home / Self Care | Payer: BC Managed Care – PPO | Source: Ambulatory Visit | Attending: Family Medicine | Admitting: Family Medicine

## 2019-02-06 DIAGNOSIS — R29898 Other symptoms and signs involving the musculoskeletal system: Secondary | ICD-10-CM

## 2019-02-06 DIAGNOSIS — R42 Dizziness and giddiness: Secondary | ICD-10-CM

## 2019-02-06 DIAGNOSIS — R262 Difficulty in walking, not elsewhere classified: Secondary | ICD-10-CM | POA: Diagnosis not present

## 2019-02-06 DIAGNOSIS — R202 Paresthesia of skin: Secondary | ICD-10-CM | POA: Diagnosis not present

## 2019-02-06 DIAGNOSIS — R531 Weakness: Secondary | ICD-10-CM | POA: Diagnosis not present

## 2019-02-06 MED ORDER — GADOBENATE DIMEGLUMINE 529 MG/ML IV SOLN
20.0000 mL | Freq: Once | INTRAVENOUS | Status: AC | PRN
  Administered 2019-02-06: 20 mL via INTRAVENOUS

## 2019-02-08 ENCOUNTER — Other Ambulatory Visit: Payer: Self-pay

## 2019-02-08 ENCOUNTER — Ambulatory Visit: Payer: BC Managed Care – PPO | Admitting: Family Medicine

## 2019-02-08 ENCOUNTER — Encounter: Payer: Self-pay | Admitting: Family Medicine

## 2019-02-08 VITALS — BP 129/76 | HR 75 | Temp 98.5°F | Resp 18 | Ht 72.0 in | Wt 253.0 lb

## 2019-02-08 DIAGNOSIS — R42 Dizziness and giddiness: Secondary | ICD-10-CM | POA: Diagnosis not present

## 2019-02-08 DIAGNOSIS — S46312D Strain of muscle, fascia and tendon of triceps, left arm, subsequent encounter: Secondary | ICD-10-CM

## 2019-02-08 DIAGNOSIS — R29898 Other symptoms and signs involving the musculoskeletal system: Secondary | ICD-10-CM | POA: Diagnosis not present

## 2019-02-08 NOTE — Patient Instructions (Addendum)
    Phenylephrine 10mg  every 4 hours as needed  If you have lab work done today you will be contacted with your lab results within the next 2 weeks.  If you have not heard from [place] then please contact us. The fastest way to get your results is to register for My Chart.   IF you received an x-ray today, you will receive an invoice from [facility]. Please contact [facility] at [phone] with questions or concerns regarding your invoice.   IF you received labwork today, you will receive an invoice from [place] [facility]. Please contact [facility] at [phone] with questions or concerns regarding your invoice.   Our billing staff will not be able to assist you with questions regarding bills from these companies.  You will be contacted with the lab results as soon as they are available. The fastest way to get your results is to activate your My Chart account. Instructions are located on the last page of this paperwork. If you have not heard from [place] regarding the results in 2 weeks, please contact this office.

## 2019-02-08 NOTE — Progress Notes (Signed)
7/9/202010:00 AM  George Garner [birth year], 63 y.o., male [id number]  Chief Complaint  Patient presents with  . Extremity Weakness    follow up MRI and [place]      HPI:   Patient is a 63 y.o. male with past medical history significant for HLP, OSA on cpap and depression who presents today for dizziness and right leg weakness  Here to review imaging   MRI brain and carotid dopplers are normal MRI should parasinus inflammation - patient uses flonase daily, denies any sinus pain/pressure, purulent discharge, fever or chills  He reports today that he is overall doing better Dizziness is becoming less frequent Right leg weakness, happens mostly in the morning, he has noticed that it happens the most when he sleeps in a certain position, working on changing position, helping  Left triceps still hurting - his main concern Has been icing and resting Takes APAP in the morning - provides some relief Continues to be painful when tries to raise above 90 degrees at shoulder or when he tries to lift even his cup of coffee  Uses cpap every night Used to have problems maintaining sleep has resolved with uses of advil pm every night  Has no other concerns today  Depression screen [facility] 2/9 02/08/2019 01/08/2019 02/20/2018  Decreased Interest 0 0 0  Down, Depressed, Hopeless 0 1 0  PHQ - 2 Score 0 1 0    Fall Risk  02/08/2019 01/08/2019 02/20/2018 01/31/2018 08/26/2017  Falls in the past year? 0 0 No No No  Number falls in past yr: - 0 - - -  Injury with Fall? - 0 - - -  Follow up Falls evaluation completed Falls evaluation completed - - -     Allergies  Allergen Reactions  . Penicillins Rash    Prior to Admission medications   Medication Sig Start Date End Date Taking? Authorizing Provider  aspirin 81 MG tablet Take 81 mg by mouth daily.   Yes [provider]  cetirizine (ZYRTEC) 10 MG tablet Take 1 tablet (10 mg total) by mouth daily. 12/10/16  Yes McVey, Gelene Mink, PA-C   fluticasone (FLONASE) 50 MCG/ACT nasal spray Place 2 sprays into both nostrils daily. 12/10/16  Yes McVey, Gelene Mink, PA-C  Multiple Vitamin (MULTIVITAMIN) tablet Take 1 tablet by mouth daily.   Yes [provider]  sertraline (ZOLOFT) 25 MG tablet Take 1 tablet (25 mg total) by mouth daily. 11/16/18   Kathrynn Ducking, MD    Past Medical History:  Diagnosis Date  . Allergy    Zyrtec daily.  Flonase PRN.  . BPH (benign prostatic hyperplasia)   . Dyslipidemia   . Erectile dysfunction   . GERD (gastroesophageal reflux disease)   . Hyperlipidemia   . Hypogonadism male   . Obstructive sleep apnea   . OSA on CPAP   . Sleep apnea     Past Surgical History:  Procedure Laterality Date  . CHOLECYSTECTOMY    . COLONOSCOPY    . deviated septum-cyst    . EYE SURGERY     L eye tumor benign.  Marland Kitchen GALLBLADDER SURGERY    . PILONIDAL CYST EXCISION    . VASECTOMY      Social History   Tobacco Use  . Smoking status: Former Smoker    Quit date: 2002    Years since quitting: 18.5  . Smokeless tobacco: Never Used  Substance Use Topics  . Alcohol use: Yes    Alcohol/week: 4.0 standard  drinks    Types: 4 Cans of beer per week    Family History  Problem Relation Age of Onset  . Diabetes Mother   . Dementia Mother   . Arthritis Brother   . Colon cancer Neg Hx   . Colon polyps Neg Hx   . Esophageal cancer Neg Hx   . Stomach cancer Neg Hx   . Rectal cancer Neg Hx     Review of Systems  Constitutional: Negative for chills and fever.  Respiratory: Negative for cough and shortness of breath.   Cardiovascular: Negative for chest pain, palpitations and leg swelling.  Gastrointestinal: Negative for abdominal pain, nausea and vomiting.   Per hpi  OBJECTIVE:  Today's Vitals   02/08/19 0919  BP: 129/76  Pulse: 75  Resp: 18  Temp: 98.5 F (36.9 C)  TempSrc: Oral  SpO2: 95%  Weight: 253 lb (114.8 kg)  Height: 6' (1.829 m)   Body mass index is 34.31 kg/m.    Physical Exam Vitals signs and nursing note reviewed.  Constitutional:      Appearance: He is well-developed.  HENT:     Head: Normocephalic and atraumatic.  Eyes:     Conjunctiva/sclera: Conjunctivae normal.     Pupils: Pupils are equal, round, and reactive to light.  Neck:     Musculoskeletal: Neck supple.  Pulmonary:     Effort: Pulmonary effort is normal.  Skin:    General: Skin is warm and dry.  Neurological:     Mental Status: He is alert and oriented to person, place, and time.    ASSESSMENT and PLAN  1. Strain of left triceps, subsequent encounter - Ambulatory referral to Sports Medicine  2. Vertigo Resolving, discussed use of OTC oral decongestant as sinuses might be contributing  3. Transient right leg weakness Resolving with change is sleep position. EKG, MRI and carotids normal. RTC precautions given.  Return in about 6 months (around 08/11/2019).    Rutherford Guys, MD Primary Care at [place], [place] [address] Ph.  [phone] Fax [phone]

## 2019-02-14 ENCOUNTER — Encounter: Payer: Self-pay | Admitting: Family Medicine

## 2019-02-14 ENCOUNTER — Ambulatory Visit: Payer: BC Managed Care – PPO | Admitting: Family Medicine

## 2019-02-14 ENCOUNTER — Other Ambulatory Visit: Payer: Self-pay

## 2019-02-14 VITALS — BP 142/70 | Ht 72.0 in | Wt 250.0 lb

## 2019-02-14 DIAGNOSIS — S46312A Strain of muscle, fascia and tendon of triceps, left arm, initial encounter: Secondary | ICD-10-CM | POA: Diagnosis not present

## 2019-02-14 NOTE — Patient Instructions (Signed)
You have an overuse strain of your medial triceps. Start home exercises - do these once a day, 3 sets of 10. Compression sleeve tends to help too but you should improve without needing this. Aleve 2 tabs twice a day with food for pain and inflammation - take for 7 days then as needed. Consider physical therapy as next step if not improving. Follow up with me in 5-6 weeks or as needed if you're doing well.

## 2019-02-14 NOTE — Progress Notes (Addendum)
PCP: Wendie Agreste, MD Consultation requested by Grant Fontana MD  Subjective:   HPI: Patient is a 63 y.o. male here for evaluation of left triceps pain. Patient notes the pain started 2 months ago following pulling ivy in his yard. After the day of yard work he developed pain in his left medial head of the triceps muscle. Patient endorses intermittent pain when he extends his elbow and when his arm is abducted. The pain has an aching quality. The pain does not radiate. He notes tylenol has not improved his pain. He has not done any physical therapy for his pain. Patient denies any neck or shoulder pain. He has no weakness, numbness, or tingling in his arm. Patient denies any bruising or swelling.   Review of Systems: See HPI above.  Past Medical History:  Diagnosis Date  . Allergy    Zyrtec daily.  Flonase PRN.  . BPH (benign prostatic hyperplasia)   . Dyslipidemia   . Erectile dysfunction   . GERD (gastroesophageal reflux disease)   . Hyperlipidemia   . Hypogonadism male   . Obstructive sleep apnea   . OSA on CPAP   . Sleep apnea     Current Outpatient Medications on File Prior to Visit  Medication Sig Dispense Refill  . aspirin 81 MG tablet Take 81 mg by mouth daily.    . cetirizine (ZYRTEC) 10 MG tablet Take 1 tablet (10 mg total) by mouth daily. 30 tablet 1  . fluticasone (FLONASE) 50 MCG/ACT nasal spray Place 2 sprays into both nostrils daily. 16 g 1  . Multiple Vitamin (MULTIVITAMIN) tablet Take 1 tablet by mouth daily.    . sertraline (ZOLOFT) 25 MG tablet Take 1 tablet (25 mg total) by mouth daily. 90 tablet 0   Current Facility-Administered Medications on File Prior to Visit  Medication Dose Route Frequency Provider Last Rate Last Dose  . 0.9 %  sodium chloride infusion  500 mL Intravenous Once Doran Stabler, MD        Past Surgical History:  Procedure Laterality Date  . CHOLECYSTECTOMY    . COLONOSCOPY    . deviated septum-cyst    . EYE SURGERY     L  eye tumor benign.  Marland Kitchen GALLBLADDER SURGERY    . PILONIDAL CYST EXCISION    . VASECTOMY      Allergies  Allergen Reactions  . Penicillins Rash    Social History   Socioeconomic History  . Marital status: Married    Spouse name: Not on file  . Number of children: Not on file  . Years of education: Not on file  . Highest education level: Not on file  Occupational History  . Occupation: Best boy: [employer]  Social Needs  . Financial resource strain: Not on file  . Food insecurity    Worry: Not on file    Inability: Not on file  . Transportation needs    Medical: Not on file    Non-medical: Not on file  Tobacco Use  . Smoking status: Former Smoker    Quit date: 2002    Years since quitting: 18.5  . Smokeless tobacco: Never Used  Substance and Sexual Activity  . Alcohol use: Yes    Alcohol/week: 4.0 standard drinks    Types: 4 Cans of beer per week  . Drug use: No  . Sexual activity: Yes    Birth control/protection: Other-see comments    Comment: vasectomy  Lifestyle  .  Physical activity    Days per week: Not on file    Minutes per session: Not on file  . Stress: Not on file  Relationships  . Social Herbalist on phone: Not on file    Gets together: Not on file    Attends religious service: Not on file    Active member of club or organization: Not on file    Attends meetings of clubs or organizations: Not on file    Relationship status: Not on file  . Intimate partner violence    Fear of current or ex partner: Not on file    Emotionally abused: Not on file    Physically abused: Not on file    Forced sexual activity: Not on file  Other Topics Concern  . Not on file  Social History Narrative   Marital status: married x 26 years.      Children: 3 children (23, 21, 16); no grandchildren.      Lives: with wife, 1 child at home       Employment: [employer] x 13 years.      Tobacco: quit smoking 15 years  ago.      Alcohol: rarely      Exercise:  3 times per week (weights, cardio).      Seatbelt: 100%      Guns: none    Family History  Problem Relation Age of Onset  . Diabetes Mother   . Dementia Mother   . Arthritis Brother   . Colon cancer Neg Hx   . Colon polyps Neg Hx   . Esophageal cancer Neg Hx   . Stomach cancer Neg Hx   . Rectal cancer Neg Hx         Objective:  Physical Exam: BP (!) 142/70   Ht 6' (1.829 m)   Wt 250 lb (113.4 kg)   BMI 33.91 kg/m  Gen: NAD, comfortable in exam room Lungs: Breathing comfortably on room air  Left arm -Inspection: No discoloration, no deformity -Palpation: Mild tenderness to palpation along the medial head of the triceps -ROM: Normal ROM with flexion, extension, pronation, supination at elbow -Strength: 5/5 strength with flexion, extension, pronation, supination at elbow but resisted extension reproduced his pain -Valgus stress: Negative NVI distally  Right arm -Inspection: No discoloration, no deformity -Palpation: No tenderness to palpation -ROM: Normal ROM with flexion, extension, pronation, supination -Strength: 5/5 strength with flexion, extension, pronation, supination\ NVI distally  Limited Diagnostic Ultrasound of Left arm Findings: Intact triceps muscle in both long and short axis. No fluid collection seen Impression: Normal appearance of triceps muscle on left arm   Assessment & Plan:  Patient is a 63 y.o. male here for evaluation of left triceps pain. Patient notes the pain started 2 months ago following pulling ivy in his yard  1. Left triceps strain -No evidence of tear or muscle fiber disruption on ultrasound -Discussed home exercises vs formal physical therapy for treatment -Patient elected for home exercises -If no improvement, will consider formal PT -may continue tylenol and ibuprofen -may try compression sleeve if desired Follow up as needed

## 2019-03-21 ENCOUNTER — Ambulatory Visit: Payer: BC Managed Care – PPO | Admitting: Family Medicine

## 2019-03-21 ENCOUNTER — Ambulatory Visit: Payer: Self-pay

## 2019-03-21 ENCOUNTER — Encounter: Payer: Self-pay | Admitting: Family Medicine

## 2019-03-21 ENCOUNTER — Other Ambulatory Visit: Payer: Self-pay

## 2019-03-21 VITALS — BP 128/70 | Ht 72.0 in | Wt 250.0 lb

## 2019-03-21 DIAGNOSIS — S46312A Strain of muscle, fascia and tendon of triceps, left arm, initial encounter: Secondary | ICD-10-CM

## 2019-03-21 DIAGNOSIS — M7542 Impingement syndrome of left shoulder: Secondary | ICD-10-CM | POA: Diagnosis not present

## 2019-03-21 MED ORDER — METHYLPREDNISOLONE ACETATE 40 MG/ML IJ SUSP
40.0000 mg | Freq: Once | INTRAMUSCULAR | Status: AC
  Administered 2019-03-21: 40 mg via INTRA_ARTICULAR

## 2019-03-21 NOTE — Progress Notes (Signed)
[facility] [address] [place], [place] [address] Phone: [phone] Fax: [phone]   Patient Name: Georgi Navarrete Date of Birth: 1956/01/13 Medical Record Number: [id number] Gender: male Date of Encounter: 03/21/2019  CC: Left arm pain  HPI: Pt is here follow-up left arm pain. Pain started 3 months ago.  He was last seen on July 15 and started a home exercise program for triceps strain.  At last visit he says there is no improvement in pain or symptoms despite improvement specializes.  Aggravating factors include reaching arm overhead. Alleviating factors include rest.  Endorses radiating to his anterior shoulder where main location of pain is currently. No hx of trauma or injury to area in the past.  Denies swelling, weakness, ecchymosis, numbness, tingling, skin changes.    Past Medical History:  Diagnosis Date  . Allergy    Zyrtec daily.  Flonase PRN.  . BPH (benign prostatic hyperplasia)   . Dyslipidemia   . Erectile dysfunction   . GERD (gastroesophageal reflux disease)   . Hyperlipidemia   . Hypogonadism male   . Obstructive sleep apnea   . OSA on CPAP   . Sleep apnea     Current Outpatient Medications on File Prior to Visit  Medication Sig Dispense Refill  . aspirin 81 MG tablet Take 81 mg by mouth daily.    . cetirizine (ZYRTEC) 10 MG tablet Take 1 tablet (10 mg total) by mouth daily. 30 tablet 1  . fluticasone (FLONASE) 50 MCG/ACT nasal spray Place 2 sprays into both nostrils daily. 16 g 1  . Multiple Vitamin (MULTIVITAMIN) tablet Take 1 tablet by mouth daily.    . sertraline (ZOLOFT) 25 MG tablet Take 1 tablet (25 mg total) by mouth daily. 90 tablet 0   Current Facility-Administered Medications on File Prior to Visit  Medication Dose Route Frequency Provider Last Rate Last Dose  . 0.9 %  sodium chloride infusion  500 mL Intravenous Once Doran Stabler, MD        Past Surgical History:  Procedure Laterality Date  .  CHOLECYSTECTOMY    . COLONOSCOPY    . deviated septum-cyst    . EYE SURGERY     L eye tumor benign.  Marland Kitchen GALLBLADDER SURGERY    . PILONIDAL CYST EXCISION    . VASECTOMY      Allergies  Allergen Reactions  . Penicillins Rash    Social History   Socioeconomic History  . Marital status: Married    Spouse name: Not on file  . Number of children: Not on file  . Years of education: Not on file  . Highest education level: Not on file  Occupational History  . 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 Financial resource strain: Not on file  . Food insecurity    Worry: Not on file    Inability: Not on file  . Transportation needs    Medical: Not on file    Non-medical: Not on file  Tobacco Use  . Smoking status: Former Smoker    Quit date: 2002    Years since quitting: 18.6  . Smokeless tobacco: Never Used  Substance and Sexual Activity  . Alcohol use: Yes    Alcohol/week: 4.0 standard drinks    Types: 4 Cans of beer per week  . Drug use: No  . Sexual activity: Yes    Birth control/protection: Other-see comments    Comment: vasectomy  Lifestyle  . Physical activity    Days per week: Not on file    Minutes per session: Not on file  . Stress: Not on file  Relationships  . Social Herbalist on phone: Not on file    Gets together: Not on file    Attends religious service: Not on file    Active member of club or organization: Not on file    Attends meetings of clubs or organizations: Not on file    Relationship status: Not on file  . Intimate partner violence    Fear of current or ex partner: Not on file    Emotionally abused: Not on file    Physically abused: Not on file    Forced sexual activity: Not on file  Other Topics Concern  . Not on file  Social History Narrative   Marital status: married x 26 years.      Children: 3 children (23, 21, 16); no grandchildren.      Lives: with wife, 1 child at home        Employment: [employer] x 13 years.      Tobacco: quit smoking 15 years ago.      Alcohol: rarely      Exercise:  3 times per week (weights, cardio).      Seatbelt: 100%      Guns: none    Family History  Problem Relation Age of Onset  . Diabetes Mother   . Dementia Mother   . Arthritis Brother   . Colon cancer Neg Hx   . Colon polyps Neg Hx   . Esophageal cancer Neg Hx   . Stomach cancer Neg Hx   . Rectal cancer Neg Hx     BP 128/70   Ht 6' (1.829 m)   Wt 250 lb (113.4 kg)   BMI 33.91 kg/m   ROS:  See HPI CONST: no F/C, no malaise, no fatigue MSK: See above NEURO: no numbness/tingling, no weakness SKIN: no rash, no lesions HEME: no bleeding, no bruising, no erythema  Objective: Left shoulder Well developed, well nourished, in no acute distress.  Rounded posture No swelling, ecchymoses.  No gross deformity.  Enlarged left olecranon bursa. No TTP at triceps insertion Pain with active range of motion in abduction and flexion Negative Hawkins Positive Neers. Negative Yergasons. Positive Jobes with 4/5 strength in supraspinatus Strength 5/5 resisted internal/external rotation. Strength 5/5 in forearm flexion and extension Negative apprehension. NV intact distally.  Limited MSK Ultrasound: Left shoulder No evidence of joint effusion.   The biceps brachii long head tendon is visualized in short and long axis view with tenosynovitis around long head. Supraspinatus, infraspinatus, subscapularis, and teres minor tendons visualized without abnormality Subacromial bursal inflammation Posterior labrum is unremarkable AC joint visualized in the long axis with no abnormality  Impression: Synovitis at biceps tendon, clinically does not correlate with exam findings.  Subacromial bursitis.  Procedure: After informed written consent timeout was performed, patient was seated on exam table. Left shoulder was prepped with alcohol swab and patient's left  subacromial space was injected with 3:1 bupivicaine: depomedrol from a lateral approach under US guidance. Patient tolerated the procedure well without immediate complications.  Assessment and Plan:  1.  Left subacromial impingement Successful injection as above.  Patient was given home exercise protocol for rotator cuff strengthening.  Patient is to take anti-inflammatories by mouth as needed.  Patient will follow-up in 6 weeks or sooner if symptoms  worsen.  Lanier Clam, DO, ATC Sports Medicine Fellow

## 2019-03-21 NOTE — Patient Instructions (Signed)
You have rotator cuff impingement Try to avoid painful activities (overhead activities, lifting with extended arm) as much as possible. Aleve 2 tabs twice a day with food OR ibuprofen 3 tabs three times a day with food for pain and inflammation. Can take tylenol in addition to this. Subacromial injection may be beneficial to help with pain and to decrease inflammation - you were given this today. Consider physical therapy with transition to home exercise program. Do home exercise program with theraband and scapular stabilization exercises daily 3 sets of 10 once a day. If not improving at follow-up we will consider further imaging, physical therapy, and/or nitro patches. Follow up with me in 6 weeks.

## 2019-04-04 ENCOUNTER — Encounter: Payer: Self-pay | Admitting: Family Medicine

## 2019-04-11 MED ORDER — MONTELUKAST SODIUM 10 MG PO TABS: 10.0000 mg | ORAL_TABLET | Freq: Every day | ORAL | 3 refills | Status: DC

## 2019-05-02 ENCOUNTER — Ambulatory Visit: Payer: BC Managed Care – PPO | Admitting: Family Medicine

## 2019-06-01 DIAGNOSIS — G4733 Obstructive sleep apnea (adult) (pediatric): Secondary | ICD-10-CM | POA: Diagnosis not present

## 2019-06-26 DIAGNOSIS — M542 Cervicalgia: Secondary | ICD-10-CM | POA: Diagnosis not present

## 2019-06-26 DIAGNOSIS — M25512 Pain in left shoulder: Secondary | ICD-10-CM | POA: Diagnosis not present

## 2019-06-26 DIAGNOSIS — M25511 Pain in right shoulder: Secondary | ICD-10-CM | POA: Diagnosis not present

## 2019-06-26 DIAGNOSIS — M545 Low back pain: Secondary | ICD-10-CM | POA: Diagnosis not present

## 2019-06-27 DIAGNOSIS — M25562 Pain in left knee: Secondary | ICD-10-CM | POA: Diagnosis not present

## 2019-06-27 DIAGNOSIS — M542 Cervicalgia: Secondary | ICD-10-CM | POA: Diagnosis not present

## 2019-06-27 DIAGNOSIS — M9906 Segmental and somatic dysfunction of lower extremity: Secondary | ICD-10-CM | POA: Diagnosis not present

## 2019-06-27 DIAGNOSIS — M9901 Segmental and somatic dysfunction of cervical region: Secondary | ICD-10-CM | POA: Diagnosis not present

## 2019-06-29 DIAGNOSIS — M25562 Pain in left knee: Secondary | ICD-10-CM | POA: Diagnosis not present

## 2019-06-29 DIAGNOSIS — M542 Cervicalgia: Secondary | ICD-10-CM | POA: Diagnosis not present

## 2019-06-29 DIAGNOSIS — M9906 Segmental and somatic dysfunction of lower extremity: Secondary | ICD-10-CM | POA: Diagnosis not present

## 2019-06-29 DIAGNOSIS — M9901 Segmental and somatic dysfunction of cervical region: Secondary | ICD-10-CM | POA: Diagnosis not present

## 2019-07-02 DIAGNOSIS — M542 Cervicalgia: Secondary | ICD-10-CM | POA: Diagnosis not present

## 2019-07-02 DIAGNOSIS — M9906 Segmental and somatic dysfunction of lower extremity: Secondary | ICD-10-CM | POA: Diagnosis not present

## 2019-07-02 DIAGNOSIS — M9901 Segmental and somatic dysfunction of cervical region: Secondary | ICD-10-CM | POA: Diagnosis not present

## 2019-07-02 DIAGNOSIS — M25562 Pain in left knee: Secondary | ICD-10-CM | POA: Diagnosis not present

## 2019-07-04 DIAGNOSIS — M542 Cervicalgia: Secondary | ICD-10-CM | POA: Diagnosis not present

## 2019-07-04 DIAGNOSIS — M9901 Segmental and somatic dysfunction of cervical region: Secondary | ICD-10-CM | POA: Diagnosis not present

## 2019-07-04 DIAGNOSIS — M25562 Pain in left knee: Secondary | ICD-10-CM | POA: Diagnosis not present

## 2019-07-04 DIAGNOSIS — M9906 Segmental and somatic dysfunction of lower extremity: Secondary | ICD-10-CM | POA: Diagnosis not present

## 2019-07-06 DIAGNOSIS — M9906 Segmental and somatic dysfunction of lower extremity: Secondary | ICD-10-CM | POA: Diagnosis not present

## 2019-07-06 DIAGNOSIS — M542 Cervicalgia: Secondary | ICD-10-CM | POA: Diagnosis not present

## 2019-07-06 DIAGNOSIS — M25562 Pain in left knee: Secondary | ICD-10-CM | POA: Diagnosis not present

## 2019-07-06 DIAGNOSIS — M9901 Segmental and somatic dysfunction of cervical region: Secondary | ICD-10-CM | POA: Diagnosis not present

## 2019-07-09 DIAGNOSIS — M9906 Segmental and somatic dysfunction of lower extremity: Secondary | ICD-10-CM | POA: Diagnosis not present

## 2019-07-09 DIAGNOSIS — M9901 Segmental and somatic dysfunction of cervical region: Secondary | ICD-10-CM | POA: Diagnosis not present

## 2019-07-09 DIAGNOSIS — M25562 Pain in left knee: Secondary | ICD-10-CM | POA: Diagnosis not present

## 2019-07-09 DIAGNOSIS — M542 Cervicalgia: Secondary | ICD-10-CM | POA: Diagnosis not present

## 2019-07-11 DIAGNOSIS — M9906 Segmental and somatic dysfunction of lower extremity: Secondary | ICD-10-CM | POA: Diagnosis not present

## 2019-07-11 DIAGNOSIS — M25562 Pain in left knee: Secondary | ICD-10-CM | POA: Diagnosis not present

## 2019-07-11 DIAGNOSIS — M9901 Segmental and somatic dysfunction of cervical region: Secondary | ICD-10-CM | POA: Diagnosis not present

## 2019-07-11 DIAGNOSIS — M542 Cervicalgia: Secondary | ICD-10-CM | POA: Diagnosis not present

## 2019-07-13 DIAGNOSIS — M9906 Segmental and somatic dysfunction of lower extremity: Secondary | ICD-10-CM | POA: Diagnosis not present

## 2019-07-13 DIAGNOSIS — M542 Cervicalgia: Secondary | ICD-10-CM | POA: Diagnosis not present

## 2019-07-13 DIAGNOSIS — M25562 Pain in left knee: Secondary | ICD-10-CM | POA: Diagnosis not present

## 2019-07-13 DIAGNOSIS — M9901 Segmental and somatic dysfunction of cervical region: Secondary | ICD-10-CM | POA: Diagnosis not present

## 2019-07-16 DIAGNOSIS — M542 Cervicalgia: Secondary | ICD-10-CM | POA: Diagnosis not present

## 2019-07-16 DIAGNOSIS — M25562 Pain in left knee: Secondary | ICD-10-CM | POA: Diagnosis not present

## 2019-07-16 DIAGNOSIS — M9901 Segmental and somatic dysfunction of cervical region: Secondary | ICD-10-CM | POA: Diagnosis not present

## 2019-07-16 DIAGNOSIS — M9906 Segmental and somatic dysfunction of lower extremity: Secondary | ICD-10-CM | POA: Diagnosis not present

## 2019-07-18 ENCOUNTER — Other Ambulatory Visit: Payer: Self-pay | Admitting: Family Medicine

## 2019-07-18 ENCOUNTER — Encounter: Payer: Self-pay | Admitting: Family Medicine

## 2019-07-18 DIAGNOSIS — M9906 Segmental and somatic dysfunction of lower extremity: Secondary | ICD-10-CM | POA: Diagnosis not present

## 2019-07-18 DIAGNOSIS — M9901 Segmental and somatic dysfunction of cervical region: Secondary | ICD-10-CM | POA: Diagnosis not present

## 2019-07-18 DIAGNOSIS — M542 Cervicalgia: Secondary | ICD-10-CM | POA: Diagnosis not present

## 2019-07-18 DIAGNOSIS — M25562 Pain in left knee: Secondary | ICD-10-CM | POA: Diagnosis not present

## 2019-07-18 NOTE — Telephone Encounter (Signed)
Approved per protocol.  

## 2019-07-20 DIAGNOSIS — M25562 Pain in left knee: Secondary | ICD-10-CM | POA: Diagnosis not present

## 2019-07-20 DIAGNOSIS — M9906 Segmental and somatic dysfunction of lower extremity: Secondary | ICD-10-CM | POA: Diagnosis not present

## 2019-07-20 DIAGNOSIS — M542 Cervicalgia: Secondary | ICD-10-CM | POA: Diagnosis not present

## 2019-07-20 DIAGNOSIS — M9901 Segmental and somatic dysfunction of cervical region: Secondary | ICD-10-CM | POA: Diagnosis not present

## 2019-07-30 DIAGNOSIS — M25562 Pain in left knee: Secondary | ICD-10-CM | POA: Diagnosis not present

## 2019-07-30 DIAGNOSIS — M542 Cervicalgia: Secondary | ICD-10-CM | POA: Diagnosis not present

## 2019-07-30 DIAGNOSIS — M9906 Segmental and somatic dysfunction of lower extremity: Secondary | ICD-10-CM | POA: Diagnosis not present

## 2019-07-30 DIAGNOSIS — M9901 Segmental and somatic dysfunction of cervical region: Secondary | ICD-10-CM | POA: Diagnosis not present

## 2019-08-01 DIAGNOSIS — M25562 Pain in left knee: Secondary | ICD-10-CM | POA: Diagnosis not present

## 2019-08-01 DIAGNOSIS — M9901 Segmental and somatic dysfunction of cervical region: Secondary | ICD-10-CM | POA: Diagnosis not present

## 2019-08-01 DIAGNOSIS — M9906 Segmental and somatic dysfunction of lower extremity: Secondary | ICD-10-CM | POA: Diagnosis not present

## 2019-08-01 DIAGNOSIS — M542 Cervicalgia: Secondary | ICD-10-CM | POA: Diagnosis not present

## 2019-08-02 DIAGNOSIS — M9901 Segmental and somatic dysfunction of cervical region: Secondary | ICD-10-CM | POA: Diagnosis not present

## 2019-08-02 DIAGNOSIS — M542 Cervicalgia: Secondary | ICD-10-CM | POA: Diagnosis not present

## 2019-08-02 DIAGNOSIS — M25562 Pain in left knee: Secondary | ICD-10-CM | POA: Diagnosis not present

## 2019-08-02 DIAGNOSIS — M9906 Segmental and somatic dysfunction of lower extremity: Secondary | ICD-10-CM | POA: Diagnosis not present

## 2019-08-06 DIAGNOSIS — M542 Cervicalgia: Secondary | ICD-10-CM | POA: Diagnosis not present

## 2019-08-06 DIAGNOSIS — M9901 Segmental and somatic dysfunction of cervical region: Secondary | ICD-10-CM | POA: Diagnosis not present

## 2019-08-06 DIAGNOSIS — M25562 Pain in left knee: Secondary | ICD-10-CM | POA: Diagnosis not present

## 2019-08-06 DIAGNOSIS — M9906 Segmental and somatic dysfunction of lower extremity: Secondary | ICD-10-CM | POA: Diagnosis not present

## 2019-08-09 DIAGNOSIS — M542 Cervicalgia: Secondary | ICD-10-CM | POA: Diagnosis not present

## 2019-08-09 DIAGNOSIS — M9906 Segmental and somatic dysfunction of lower extremity: Secondary | ICD-10-CM | POA: Diagnosis not present

## 2019-08-09 DIAGNOSIS — M25562 Pain in left knee: Secondary | ICD-10-CM | POA: Diagnosis not present

## 2019-08-09 DIAGNOSIS — M9901 Segmental and somatic dysfunction of cervical region: Secondary | ICD-10-CM | POA: Diagnosis not present

## 2019-08-10 ENCOUNTER — Ambulatory Visit: Payer: BC Managed Care – PPO | Admitting: Family Medicine

## 2019-08-10 ENCOUNTER — Other Ambulatory Visit: Payer: Self-pay

## 2019-08-10 ENCOUNTER — Encounter: Payer: Self-pay | Admitting: Family Medicine

## 2019-08-10 VITALS — BP 132/74 | HR 71 | Temp 97.8°F | Wt 227.4 lb

## 2019-08-10 DIAGNOSIS — J309 Allergic rhinitis, unspecified: Secondary | ICD-10-CM | POA: Diagnosis not present

## 2019-08-10 DIAGNOSIS — E785 Hyperlipidemia, unspecified: Secondary | ICD-10-CM | POA: Diagnosis not present

## 2019-08-10 MED ORDER — MONTELUKAST SODIUM 10 MG PO TABS: ORAL_TABLET | ORAL | 1 refills | Status: DC

## 2019-08-10 NOTE — Patient Instructions (Addendum)
   No change in meds at this time. Will let you know about cholesterol in next week or two. Congratulations on the weight loss and keep up with the good work!  If you have lab work done today you will be contacted with your lab results within the next 2 weeks.  If you have not heard from [place] then please contact us. The fastest way to get your results is to register for My Chart.   IF you received an x-ray today, you will receive an invoice from [facility]. Please contact [facility] at [phone] with questions or concerns regarding your invoice.   IF you received labwork today, you will receive an invoice from [facility]. Please contact [facility] at [phone] with questions or concerns regarding your invoice.   Our billing staff will not be able to assist you with questions regarding bills from these companies.  You will be contacted with the lab results as soon as they are available. The fastest way to get your results is to activate your My Chart account. Instructions are located on the last page of this paperwork. If you have not heard from [place] regarding the results in 2 weeks, please contact this office.

## 2019-08-10 NOTE — Progress Notes (Signed)
Subjective:  Patient ID: George Garner, male    DOB: Jul 05, 1956  Age: 64 y.o. MRN: [id number]  CC:  Chief Complaint  Patient presents with  . Medical Management of Chronic Issues    6 month f/u on hyperlipidmia    HPI George Garner presents for   Hyperlipidemia:  Reluctant to take statins when discussed in the past.  Plan on diet/exercise approach.  Cholesterol improved from July 2019 with total 225, LDL 162 at that time. Has been working with doctor assisted weight loss - on phentermine. Avoiding sugar.  Less exercise recently (usually 55mi/day walking - weather has been prohibitive).   Mom passed at 70yo, father is 72- still living. No FH of CAD/CVD known.   Wt Readings from Last 3 Encounters:  08/10/19 227 lb 6.4 oz (103.1 kg)  03/21/19 250 lb (113.4 kg)  02/14/19 250 lb (113.4 kg)    Lab Results  Component Value Date   CHOL 216 (H) 01/08/2019   HDL 37 (L) 01/08/2019   LDLCALC 144 (H) 01/08/2019   TRIG 173 (H) 01/08/2019   CHOLHDL 5.8 (H) 01/08/2019   Lab Results  Component Value Date   ALT 32 01/08/2019   AST 23 01/08/2019   ALKPHOS 65 01/08/2019   BILITOT 0.3 01/08/2019   The 10-year ASCVD risk score Mikey Bussing DC Jr., et al., 2013) is: 15.7%   Values used to calculate the score:     Age: 55 years     Sex: Male     Is Non-Hispanic African American: No     Diabetic: No     Tobacco smoker: No     Systolic Blood Pressure: Q000111Q mmHg     Is BP treated: No     HDL Cholesterol: 37 mg/dL     Total Cholesterol: 216 mg/dL   Allergic rhinitis: Singulair 10 mg daily, Zyrtec 10 mg daily, Flonase nasal spray daily. Has been helping with symptoms.  Tylenol pm for sleep - 7.5 hrs per night. Doing ok.   History Patient Active Problem List   Diagnosis Date Noted  . Fatigue 05/24/2018  . Somnolence, daytime 05/24/2018  . Obstructive sleep apnea treated with continuous positive airway pressure (CPAP) 01/31/2018  . Snoring 09/28/2017  . UARS (upper airway resistance syndrome)  09/28/2017  . Retrognathia 09/28/2017  . Allergic rhinitis due to allergen 09/28/2017  . Obesity (BMI 30.0-34.9) 09/28/2017  . Acute URI 02/09/2017  . Chronic pain of left knee 02/09/2017  . Other seasonal allergic rhinitis 05/24/2014  . OSA on CPAP 05/24/2014  . GERD (gastroesophageal reflux disease)   . Dyslipidemia   . HYPOGODADISM MALE   . Cough 11/06/2008   Past Medical History:  Diagnosis Date  . Allergy    Zyrtec daily.  Flonase PRN.  . BPH (benign prostatic hyperplasia)   . Dyslipidemia   . Erectile dysfunction   . GERD (gastroesophageal reflux disease)   . Hyperlipidemia   . Hypogonadism male   . Obstructive sleep apnea   . OSA on CPAP   . Sleep apnea    Past Surgical History:  Procedure Laterality Date  . CHOLECYSTECTOMY    . COLONOSCOPY    . deviated septum-cyst    . EYE SURGERY     L eye tumor benign.  Marland Kitchen GALLBLADDER SURGERY    . PILONIDAL CYST EXCISION    . VASECTOMY     Allergies  Allergen Reactions  . Penicillins Rash   Prior to Admission medications   Medication Sig Start Date End  Date Taking? Authorizing Provider  acetaminophen (TYLENOL) 500 MG tablet Take 500 mg by mouth every 6 (six) hours as needed.   Yes [provider]  aspirin 81 MG tablet Take 81 mg by mouth daily.   Yes [provider]  cetirizine (ZYRTEC) 10 MG tablet Take 1 tablet (10 mg total) by mouth daily. 12/10/16  Yes McVey, Gelene Mink, PA-C  diphenhydrAMINE (BENADRYL) 25 MG tablet Take 25 mg by mouth every 6 (six) hours as needed.   Yes [provider]  fluticasone (FLONASE) 50 MCG/ACT nasal spray Place 2 sprays into both nostrils daily. 12/10/16  Yes McVey, Gelene Mink, PA-C  montelukast (SINGULAIR) 10 MG tablet TAKE 1 TABLET BY MOUTH EVERYDAY AT BEDTIME 07/18/19  Yes Rutherford Guys, MD  Multiple Vitamin (MULTIVITAMIN) tablet Take 1 tablet by mouth daily.   Yes [provider]  phentermine 37.5 MG capsule Take 37.5 mg by mouth every  morning.   Yes [provider]   Social History   Socioeconomic History  . Marital status: Married    Spouse name: Not on file  . Number of children: Not on file  . Years of education: Not on file  . Highest education level: Not on file  Occupational History  . 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 Smoking status: Former Smoker    Quit date: 2002    Years since quitting: 19.0  . Smokeless tobacco: Never Used  Substance and Sexual Activity  . Alcohol use: Yes    Alcohol/week: 4.0 standard drinks    Types: 4 Cans of beer per week  . Drug use: No  . Sexual activity: Yes    Birth control/protection: Other-see comments    Comment: vasectomy  Other Topics Concern  . Not on file  Social History Narrative   Marital status: married x 26 years.      Children: 3 children (23, 21, 16); no grandchildren.      Lives: with wife, 1 child at home       Employment: [employer] x 13 years.      Tobacco: quit smoking 15 years ago.      Alcohol: rarely      Exercise:  3 times per week (weights, cardio).      Seatbelt: 100%      Guns: none   Social Determinants of Radio broadcast assistant Strain:   . Difficulty of Paying Living Expenses: Not on file  Food Insecurity:   . Worried About Charity fundraiser in the Last Year: Not on file  . Ran Out of Food in the Last Year: Not on file  Transportation Needs:   . Lack of Transportation (Medical): Not on file  . Lack of Transportation (Non-Medical): Not on file  Physical Activity:   . Days of Exercise per Week: Not on file  . Minutes of Exercise per Session: Not on file  Stress:   . Feeling of Stress : Not on file  Social Connections:   . Frequency of Communication with Friends and Family: Not on file  . Frequency of Social Gatherings with Friends and Family: Not on file  . Attends Religious Services: Not on file  . Active Member of Clubs or Organizations: Not on file   . Attends Archivist Meetings: Not on file  . Marital Status: Not on file  Intimate Partner Violence:   . Fear of Current or Ex-Partner: Not  on file  . Emotionally Abused: Not on file  . Physically Abused: Not on file  . Sexually Abused: Not on file    Review of Systems Per HPI.   Objective:   Vitals:   08/10/19 0839  BP: 132/74  Pulse: 71  Temp: 97.8 F (36.6 C)  TempSrc: Oral  SpO2: 97%  Weight: 227 lb 6.4 oz (103.1 kg)     Physical Exam Vitals reviewed.  Constitutional:      Appearance: He is well-developed.  HENT:     Head: Normocephalic and atraumatic.  Eyes:     Pupils: Pupils are equal, round, and reactive to light.  Neck:     Vascular: No carotid bruit or JVD.  Cardiovascular:     Rate and Rhythm: Normal rate and regular rhythm.     Heart sounds: Normal heart sounds. No murmur.  Pulmonary:     Effort: Pulmonary effort is normal.     Breath sounds: Normal breath sounds. No rales.  Skin:    General: Skin is warm and dry.  Neurological:     Mental Status: He is alert and oriented to person, place, and time.        Assessment & Plan:  Kolter Beachum is a 64 y.o. male . Hyperlipidemia, unspecified hyperlipidemia type - Plan: Lipid Panel, Comprehensive metabolic panel  -Repeat labs, commended on weight loss.  ASCVD risk warning in plan for labs discussed.  Could possibly consider coronary calcium scoring as well, no known family history of coronary disease.   Allergic rhinitis, unspecified seasonality, unspecified trigger - Plan: montelukast (SINGULAIR) 10 MG tablet  -Stable, continue Singulair, Zyrtec, Flonase which he buys over-the-counter.  Tylenol PM has been working well for sleep, may also have some benefit from allergies with the Benadryl.  Recheck 6 months for wellness exam/physical.  No orders of the defined types were placed in this encounter.  Patient Instructions       If you have lab work done today you will be contacted with  your lab results within the next 2 weeks.  If you have not heard from [place] then please contact us. The fastest way to get your results is to register for My Chart.   IF you received an x-ray today, you will receive an invoice from [facility]. Please contact [facility] at [phone] with questions or concerns regarding your invoice.   IF you received labwork today, you will receive an invoice from [facility]. Please contact [facility] at [phone] with questions or concerns regarding your invoice.   Our billing staff will not be able to assist you with questions regarding bills from these companies.  You will be contacted with the lab results as soon as they are available. The fastest way to get your results is to activate your My Chart account. Instructions are located on the last page of this paperwork. If you have not heard from [place] regarding the results in 2 weeks, please contact this office.          Signed, Merri Ray, MD [facility]

## 2019-08-11 LAB — COMPREHENSIVE METABOLIC PANEL
ALT: 25 IU/L (ref 0–44)
AST: 18 IU/L (ref 0–40)
Albumin/Globulin Ratio: 2.1 (ref 1.2–2.2)
Albumin: 4.6 g/dL (ref 3.8–4.8)
Alkaline Phosphatase: 63 IU/L (ref 39–117)
BUN/Creatinine Ratio: 17 (ref 10–24)
BUN: 21 mg/dL (ref 8–27)
Bilirubin Total: 0.4 mg/dL (ref 0.0–1.2)
CO2: 23 mmol/L (ref 20–29)
Calcium: 9.7 mg/dL (ref 8.6–10.2)
Chloride: 105 mmol/L (ref 96–106)
Creatinine, Ser: 1.24 mg/dL (ref 0.76–1.27)
GFR calc Af Amer: 71 mL/min/{1.73_m2} (ref >59)
GFR calc non Af Amer: 61 mL/min/{1.73_m2} (ref >59)
Globulin, Total: 2.2 g/dL (ref 1.5–4.5)
Glucose: 98 mg/dL (ref 65–99)
Potassium: 5 mmol/L (ref 3.5–5.2)
Sodium: 140 mmol/L (ref 134–144)
Total Protein: 6.8 g/dL (ref 6.0–8.5)

## 2019-08-11 LAB — LIPID PANEL
Chol/HDL Ratio: 4 ratio (ref 0.0–5.0)
Cholesterol, Total: 170 mg/dL (ref 100–199)
HDL: 42 mg/dL (ref >39)
LDL Chol Calc (NIH): 113 mg/dL — ABNORMAL HIGH (ref 0–99)
Triglycerides: 77 mg/dL (ref 0–149)
VLDL Cholesterol Cal: 15 mg/dL (ref 5–40)

## 2019-08-29 ENCOUNTER — Other Ambulatory Visit: Payer: Self-pay

## 2019-08-29 ENCOUNTER — Ambulatory Visit (INDEPENDENT_AMBULATORY_CARE_PROVIDER_SITE_OTHER): Payer: BC Managed Care – PPO | Admitting: Family Medicine

## 2019-08-29 VITALS — BP 129/76 | HR 77 | Temp 98.0°F | Ht 72.0 in | Wt 223.0 lb

## 2019-08-29 DIAGNOSIS — Z0001 Encounter for general adult medical examination with abnormal findings: Secondary | ICD-10-CM

## 2019-08-29 DIAGNOSIS — K5900 Constipation, unspecified: Secondary | ICD-10-CM | POA: Diagnosis not present

## 2019-08-29 DIAGNOSIS — Z23 Encounter for immunization: Secondary | ICD-10-CM

## 2019-08-29 DIAGNOSIS — Z0000 Encounter for general adult medical examination without abnormal findings: Secondary | ICD-10-CM

## 2019-08-29 DIAGNOSIS — R350 Frequency of micturition: Secondary | ICD-10-CM

## 2019-08-29 DIAGNOSIS — N401 Enlarged prostate with lower urinary tract symptoms: Secondary | ICD-10-CM | POA: Diagnosis not present

## 2019-08-29 LAB — POC MICROSCOPIC URINALYSIS (UMFC): Mucus: ABSENT

## 2019-08-29 LAB — POCT URINALYSIS DIP (MANUAL ENTRY)
Bilirubin, UA: NEGATIVE
Blood, UA: NEGATIVE
Glucose, UA: NEGATIVE mg/dL
Ketones, POC UA: NEGATIVE mg/dL
Leukocytes, UA: NEGATIVE
Nitrite, UA: NEGATIVE
Protein Ur, POC: NEGATIVE mg/dL
Spec Grav, UA: 1.01 (ref 1.010–1.025)
Urobilinogen, UA: 0.2 E.U./dL
pH, UA: 6 (ref 5.0–8.0)

## 2019-08-29 MED ORDER — SHINGRIX 50 MCG/0.5ML IM SUSR: 0.5000 mL | Freq: Once | INTRAMUSCULAR | 1 refills | Status: AC

## 2019-08-29 MED ORDER — CIPROFLOXACIN HCL 500 MG PO TABS: 500.0000 mg | ORAL_TABLET | Freq: Two times a day (BID) | ORAL | 0 refills | Status: DC

## 2019-08-29 NOTE — Progress Notes (Signed)
Subjective:  Patient ID: George Garner, male    DOB: 09/09/1955  Age: 64 y.o. MRN: [id number]  CC:  Chief Complaint  Patient presents with  . Annual Exam    pt states he feels good with no complaints. no physical hyperlipidema symptoms at this time.    HPI George Garner presents for   Annual physical exam, recent visit January 8 for chronic medical problems.  Allergic rhinitis: Flonase, Singulair, Zyrtec have been used.  Obstructive sleep apnea: With history of retrognathia, upper airway resistance, has used CPAP.  Hyperlipidemia: Recent labs much improved, still slight elevation.  Continued diet/exercise approach:  Lab Results  Component Value Date   CHOL 170 08/10/2019   HDL 42 08/10/2019   LDLCALC 113 (H) 08/10/2019   TRIG 77 08/10/2019   CHOLHDL 4.0 08/10/2019   Lab Results  Component Value Date   ALT 25 08/10/2019   AST 18 08/10/2019   ALKPHOS 63 08/10/2019   BILITOT 0.4 08/10/2019   Constipation: Past month or so.  Tried miralax - recommended by weight loss specialist. Did not tolerate. Some large BM's recently, then day after without BM.  Drinking water. No fiber supplements, but fiber in diet - fruit vegetables. No carbs.   Cancer screening: Colonoscopy 04/04/18 Prostate: last tested in 08/26/17. PSA 4.9. recommended urology follow up - had seen one in [place] prior. He did not call them for follow up. Has not had repeated.  Told had BPH in past.  More frequency, dribbling, 2+ nocturia. Increase in symptoms past month. Slight discomfort/intense feeling with urination.  Saw palmetto - min relief.  No fever/abd pain/back pain.   Immunization History  Administered Date(s) Administered  . Influenza Whole 04/29/2011  . Influenza,inj,Quad PF,6+ Mos 05/13/2014, 06/23/2015, 07/02/2016, 08/12/2017, 05/26/2018, 04/04/2019  . Tdap 06/08/2006, 07/02/2016  . Zoster 08/03/2011  shingrix: - has not had. Agrees to have.   Depression screen [facility] 2/9 08/29/2019 08/10/2019 02/08/2019 01/08/2019  02/20/2018  Decreased Interest 0 0 0 0 0  Down, Depressed, Hopeless 0 0 0 1 0  PHQ - 2 Score 0 0 0 1 0    Hearing Screening   125Hz  250Hz  500Hz  1000Hz  2000Hz  3000Hz  4000Hz  6000Hz  8000Hz   Right ear:           Left ear:             Visual Acuity Screening   Right eye Left eye Both eyes  Without correction:     With correction: 20/20 20/25 20/20    Last optho eval last fall - new glasses  Dental: frequent follow up - 4 month.   Exercise: decreased with cold, but walking and work in yard,  usually 133mins.   History Patient Active Problem List   Diagnosis Date Noted  . Fatigue 05/24/2018  . Somnolence, daytime 05/24/2018  . Obstructive sleep apnea treated with continuous positive airway pressure (CPAP) 01/31/2018  . Snoring 09/28/2017  . UARS (upper airway resistance syndrome) 09/28/2017  . Retrognathia 09/28/2017  . Allergic rhinitis due to allergen 09/28/2017  . Obesity (BMI 30.0-34.9) 09/28/2017  . Acute URI 02/09/2017  . Chronic pain of left knee 02/09/2017  . Other seasonal allergic rhinitis 05/24/2014  . OSA on CPAP 05/24/2014  . GERD (gastroesophageal reflux disease)   . Dyslipidemia   . HYPOGODADISM MALE   . Cough 11/06/2008   Past Medical History:  Diagnosis Date  . Allergy    Zyrtec daily.  Flonase PRN.  . BPH (benign prostatic hyperplasia)   . Dyslipidemia   .  Erectile dysfunction   . GERD (gastroesophageal reflux disease)   . Hyperlipidemia   . Hypogonadism male   . Obstructive sleep apnea   . OSA on CPAP   . Sleep apnea    Past Surgical History:  Procedure Laterality Date  . CHOLECYSTECTOMY    . COLONOSCOPY    . deviated septum-cyst    . EYE SURGERY     L eye tumor benign.  Marland Kitchen GALLBLADDER SURGERY    . PILONIDAL CYST EXCISION    . VASECTOMY     Allergies  Allergen Reactions  . Penicillins Rash   Prior to Admission medications   Medication Sig Start Date End Date Taking? Authorizing Provider  acetaminophen (TYLENOL) 500 MG tablet Take 500 mg  by mouth every 6 (six) hours as needed.   Yes [provider]  aspirin 81 MG tablet Take 81 mg by mouth daily.   Yes [provider]  cetirizine (ZYRTEC) 10 MG tablet Take 1 tablet (10 mg total) by mouth daily. 12/10/16  Yes McVey, Gelene Mink, PA-C  fluticasone (FLONASE) 50 MCG/ACT nasal spray Place 2 sprays into both nostrils daily. 12/10/16  Yes McVey, Gelene Mink, PA-C  montelukast (SINGULAIR) 10 MG tablet TAKE 1 TABLET BY MOUTH EVERYDAY AT BEDTIME 08/10/19  Yes Wendie Agreste, MD  Multiple Vitamin (MULTIVITAMIN) tablet Take 1 tablet by mouth daily.   Yes [provider]  phentermine 37.5 MG capsule Take 37.5 mg by mouth every morning.   Yes [provider]   Social History   Socioeconomic History  . Marital status: Married    Spouse name: Not on file  . Number of children: Not on file  . Years of education: Not on file  . Highest education level: Not on file  Occupational History  . Occupation: Best boy: [employer]  Tobacco Use  . Smoking status: Former Smoker    Quit date: 2002    Years since quitting: 19.0  . Smokeless tobacco: Never Used  Substance and Sexual Activity  . Alcohol use: Yes    Alcohol/week: 4.0 standard drinks    Types: 4 Cans of beer per week  . Drug use: No  . Sexual activity: Yes    Birth control/protection: Other-see comments    Comment: vasectomy  Other Topics Concern  . Not on file  Social History Narrative   Marital status: married x 26 years.      Children: 3 children (23, 21, 16); no grandchildren.      Lives: with wife, 1 child at home       Employment: [employer] x 13 years.      Tobacco: quit smoking 15 years ago.      Alcohol: rarely      Exercise:  3 times per week (weights, cardio).      Seatbelt: 100%      Guns: none   Social Determinants of Radio broadcast assistant Strain:   . Difficulty of Paying Living Expenses: Not on file  Food  Insecurity:   . Worried About Charity fundraiser in the Last Year: Not on file  . Ran Out of Food in the Last Year: Not on file  Transportation Needs:   . Lack of Transportation (Medical): Not on file  . Lack of Transportation (Non-Medical): Not on file  Physical Activity:   . Days of Exercise per Week: Not on file  . Minutes of Exercise per Session: Not on  file  Stress:   . Feeling of Stress : Not on file  Social Connections:   . Frequency of Communication with Friends and Family: Not on file  . Frequency of Social Gatherings with Friends and Family: Not on file  . Attends Religious Services: Not on file  . Active Member of Clubs or Organizations: Not on file  . Attends Archivist Meetings: Not on file  . Marital Status: Not on file  Intimate Partner Violence:   . Fear of Current or Ex-Partner: Not on file  . Emotionally Abused: Not on file  . Physically Abused: Not on file  . Sexually Abused: Not on file    Review of Systems  13 point review of systems per patient health survey noted.  Negative other than as indicated above or in HPI.  Chronic hearing loss.   Objective:   Vitals:   08/29/19 1048  BP: 129/76  Pulse: 77  Temp: 98 F (36.7 C)  TempSrc: Temporal  SpO2: 100%  Weight: 223 lb (101.2 kg)  Height: 6' (1.829 m)     Physical Exam Vitals reviewed.  Constitutional:      Appearance: He is well-developed.  HENT:     Head: Normocephalic and atraumatic.     Right Ear: External ear normal.     Left Ear: External ear normal.  Eyes:     Conjunctiva/sclera: Conjunctivae normal.     Pupils: Pupils are equal, round, and reactive to light.  Neck:     Thyroid: No thyromegaly.  Cardiovascular:     Rate and Rhythm: Normal rate and regular rhythm.     Heart sounds: Normal heart sounds.  Pulmonary:     Effort: Pulmonary effort is normal. No respiratory distress.     Breath sounds: Normal breath sounds. No wheezing.  Abdominal:     General: There is  no distension.     Palpations: Abdomen is soft.     Tenderness: There is no abdominal tenderness.     Hernia: There is no hernia in the left inguinal area or right inguinal area.  Genitourinary:    Penis: Normal. No discharge or swelling.      Testes: Normal.        Right: Tenderness not present.        Left: Tenderness not present.     Epididymis:     Right: No tenderness.     Left: No tenderness.     Prostate: Enlarged. Not tender (no focal ttp, but "tight feeling" on exam. firm diffusely, enlarged. ).  Musculoskeletal:        General: No tenderness. Normal range of motion.     Cervical back: Normal range of motion and neck supple.  Lymphadenopathy:     Cervical: No cervical adenopathy.  Skin:    General: Skin is warm and dry.  Neurological:     Mental Status: He is alert and oriented to person, place, and time.     Deep Tendon Reflexes: Reflexes are normal and symmetric.  Psychiatric:        Behavior: Behavior normal.        Assessment & Plan:  George Garner is a 64 y.o. male . Annual physical exam  - -anticipatory guidance as below in AVS, screening labs above. Health maintenance items as above in HPI discussed/recommended as applicable.   Constipation, unspecified constipation type  -Fiber, preventative techniques discussed, MiraLAX if needed, handout given with RTC precautions  Urinary frequency - Plan: PSA, POCT urinalysis dipstick,  POCT Microscopic Urinalysis ([facility]), ciprofloxacin (CIPRO) 500 MG tablet Benign prostatic hyperplasia with urinary frequency - Plan: PSA, POCT urinalysis dipstick, POCT Microscopic Urinalysis ([facility]), ciprofloxacin (CIPRO) 500 MG tablet  -Possible subacute prostatitis.  Previous BPH with elevated check PSA, start Cipro, recheck next few weeks and decide if urology follow-up needed at that time.  RTC precautions if worse.  Need for shingles vaccine - Plan: Zoster Vaccine Adjuvanted [facility]) injection Sent to pharmacy  Meds ordered this  encounter  Medications  . Zoster Vaccine Adjuvanted [facility]) injection    Sig: Inject 0.5 mLs into the muscle once for 1 dose. Repeat in 2-6 months.    Dispense:  0.5 mL    Refill:  1  . ciprofloxacin (CIPRO) 500 MG tablet    Sig: Take 1 tablet (500 mg total) by mouth 2 (two) times daily.    Dispense:  20 tablet    Refill:  0   Patient Instructions    I will recehck PSA. Start cipro for possible prostate infection on top of the enlarged prostate. Will follow up in next few weeks for repeat testing, and can discuss urology follow up at that time as well.  Return to the clinic or go to the nearest emergency room if any of your symptoms worsen or new symptoms occur.   See info below on constipation below - follow up if that is not continuing to improve.     Constipation, Adult Constipation is when a person has fewer bowel movements in a week than normal, has difficulty having a bowel movement, or has stools that are dry, hard, or larger than normal. Constipation may be caused by an underlying condition. It may become worse with age if a person takes certain medicines and does not take in enough fluids. Follow these instructions at home: Eating and drinking   Eat foods that have a lot of fiber, such as fresh fruits and vegetables, whole grains, and beans.  Limit foods that are high in fat, low in fiber, or overly processed, such as french fries, hamburgers, cookies, candies, and soda.  Drink enough fluid to keep your urine clear or pale yellow. General instructions  Exercise regularly or as told by your health care provider.  Go to the restroom when you have the urge to go. Do not hold it in.  Take over-the-counter and prescription medicines only as told by your health care provider. These include any fiber supplements.  Practice pelvic floor retraining exercises, such as deep breathing while relaxing the lower abdomen and pelvic floor relaxation during bowel movements.  Watch  your condition for any changes.  Keep all follow-up visits as told by your health care provider. This is important. Contact a health care provider if:  You have pain that gets worse.  You have a fever.  You do not have a bowel movement after 4 days.  You vomit.  You are not hungry.  You lose weight.  You are bleeding from the anus.  You have thin, pencil-like stools. Get help right away if:  You have a fever and your symptoms suddenly get worse.  You leak stool or have blood in your stool.  Your abdomen is bloated.  You have severe pain in your abdomen.  You feel dizzy or you faint. This information is not intended to replace advice given to you by your health care provider. Make sure you discuss any questions you have with your health care provider. Document Revised: 07/01/2017 Document Reviewed: 01/07/2016 Elsevier Patient  Education  [place] you healthy  Get these tests  Blood pressure- Have your blood pressure checked once a year by your healthcare provider.  Normal blood pressure is 120/80  Weight- Have your body mass index (BMI) calculated to screen for obesity.  BMI is a measure of body fat based on height and weight. You can also calculate your own BMI at [email or web address].  Cholesterol- Have your cholesterol checked every year.  Diabetes- Have your blood sugar checked regularly if you have high blood pressure, high cholesterol, have a family history of diabetes or if you are overweight.  Screening for Colon Cancer- Colonoscopy starting at age 58.  Screening may begin sooner depending on your family history and other health conditions. Follow up colonoscopy as directed by your Gastroenterologist.  Screening for Prostate Cancer- Both blood work (PSA) and a rectal exam help screen for Prostate Cancer.  Screening begins at age 12 with African-American men and at age 13 with Caucasian men.  Screening may begin sooner depending on  your family history.  Take these medicines  Aspirin- One aspirin daily can help prevent Heart disease and Stroke.  Flu shot- Every fall.  Tetanus- Every 10 years.  Zostavax- Once after the age of 59 to prevent Shingles.  Pneumonia shot- Once after the age of 2; if you are younger than 39, ask your healthcare provider if you need a Pneumonia shot.  Take these steps  Don't smoke- If you do smoke, talk to your doctor about quitting.  For tips on how to quit, go to www.smokefree.gov or call 1-800-QUIT-NOW.  Be physically active- Exercise 5 days a week for at least 30 minutes.  If you are not already physically active start slow and gradually work up to 30 minutes of moderate physical activity.  Examples of moderate activity include walking briskly, mowing the yard, dancing, swimming, bicycling, etc.  Eat a healthy diet- Eat a variety of healthy food such as fruits, vegetables, low fat milk, low fat cheese, yogurt, lean meant, poultry, fish, beans, tofu, etc. For more information go to www.thenutritionsource.org  Drink alcohol in moderation- Limit alcohol intake to less than two drinks a day. Never drink and drive.  Dentist- Brush and floss twice daily; visit your dentist twice a year.  Depression- Your emotional health is as important as your physical health. If you're feeling down, or losing interest in things you would normally enjoy please talk to your healthcare provider.  Eye exam- Visit your eye doctor every year.  Safe sex- If you may be exposed to a sexually transmitted infection, use a condom.  Seat belts- Seat belts can save your life; always wear one.  Smoke/Carbon Monoxide detectors- These detectors need to be installed on the appropriate level of your home.  Replace batteries at least once a year.  Skin cancer- When out in the sun, cover up and use sunscreen 15 SPF or higher.  Violence- If anyone is threatening you, please tell your healthcare provider.  Living Will/  Health care power of attorney- Speak with your healthcare provider and family. If you have lab work done today you will be contacted with your lab results within the next 2 weeks.  If you have not heard from [place] then please contact us. The fastest way to get your results is to register for My Chart.   IF you received an x-ray today, you will receive an invoice from [facility]. Please contact [facility] at [phone] with questions  or concerns regarding your invoice.   IF you received labwork today, you will receive an invoice from [place]. Please contact [facility] at [phone] with questions or concerns regarding your invoice.   Our billing staff will not be able to assist you with questions regarding bills from these companies.  You will be contacted with the lab results as soon as they are available. The fastest way to get your results is to activate your My Chart account. Instructions are located on the last page of this paperwork. If you have not heard from [place] regarding the results in 2 weeks, please contact this office.         Signed, Merri Ray, MD [facility]

## 2019-08-29 NOTE — Patient Instructions (Addendum)
I will recehck PSA. Start cipro for possible prostate infection on top of the enlarged prostate. Will follow up in next few weeks for repeat testing, and can discuss urology follow up at that time as well.  Return to the clinic or go to the nearest emergency room if any of your symptoms worsen or new symptoms occur.   See info below on constipation below - follow up if that is not continuing to improve.     Constipation, Adult Constipation is when a person has fewer bowel movements in a week than normal, has difficulty having a bowel movement, or has stools that are dry, hard, or larger than normal. Constipation may be caused by an underlying condition. It may become worse with age if a person takes certain medicines and does not take in enough fluids. Follow these instructions at home: Eating and drinking   Eat foods that have a lot of fiber, such as fresh fruits and vegetables, whole grains, and beans.  Limit foods that are high in fat, low in fiber, or overly processed, such as french fries, hamburgers, cookies, candies, and soda.  Drink enough fluid to keep your urine clear or pale yellow. General instructions  Exercise regularly or as told by your health care provider.  Go to the restroom when you have the urge to go. Do not hold it in.  Take over-the-counter and prescription medicines only as told by your health care provider. These include any fiber supplements.  Practice pelvic floor retraining exercises, such as deep breathing while relaxing the lower abdomen and pelvic floor relaxation during bowel movements.  Watch your condition for any changes.  Keep all follow-up visits as told by your health care provider. This is important. Contact a health care provider if:  You have pain that gets worse.  You have a fever.  You do not have a bowel movement after 4 days.  You vomit.  You are not hungry.  You lose weight.  You are bleeding from the anus.  You have  thin, pencil-like stools. Get help right away if:  You have a fever and your symptoms suddenly get worse.  You leak stool or have blood in your stool.  Your abdomen is bloated.  You have severe pain in your abdomen.  You feel dizzy or you faint. This information is not intended to replace advice given to you by your health care provider. Make sure you discuss any questions you have with your health care provider. Document Revised: 07/01/2017 Document Reviewed: 01/07/2016 Elsevier Patient Education  2020 [place] you healthy  Get these tests  Blood pressure- Have your blood pressure checked once a year by your healthcare provider.  Normal blood pressure is 120/80  Weight- Have your body mass index (BMI) calculated to screen for obesity.  BMI is a measure of body fat based on height and weight. You can also calculate your own BMI at [email or web address].  Cholesterol- Have your cholesterol checked every year.  Diabetes- Have your blood sugar checked regularly if you have high blood pressure, high cholesterol, have a family history of diabetes or if you are overweight.  Screening for Colon Cancer- Colonoscopy starting at age 66.  Screening may begin sooner depending on your family history and other health conditions. Follow up colonoscopy as directed by your Gastroenterologist.  Screening for Prostate Cancer- Both blood work (PSA) and a rectal exam help screen for Prostate Cancer.  Screening begins at age 57 with African-American  men and at age 19 with Caucasian men.  Screening may begin sooner depending on your family history.  Take these medicines  Aspirin- One aspirin daily can help prevent Heart disease and Stroke.  Flu shot- Every fall.  Tetanus- Every 10 years.  Zostavax- Once after the age of 7 to prevent Shingles.  Pneumonia shot- Once after the age of 39; if you are younger than 54, ask your healthcare provider if you need a Pneumonia  shot.  Take these steps  Don't smoke- If you do smoke, talk to your doctor about quitting.  For tips on how to quit, go to www.smokefree.gov or call 1-800-QUIT-NOW.  Be physically active- Exercise 5 days a week for at least 30 minutes.  If you are not already physically active start slow and gradually work up to 30 minutes of moderate physical activity.  Examples of moderate activity include walking briskly, mowing the yard, dancing, swimming, bicycling, etc.  Eat a healthy diet- Eat a variety of healthy food such as fruits, vegetables, low fat milk, low fat cheese, yogurt, lean meant, poultry, fish, beans, tofu, etc. For more information go to www.thenutritionsource.org  Drink alcohol in moderation- Limit alcohol intake to less than two drinks a day. Never drink and drive.  Dentist- Brush and floss twice daily; visit your dentist twice a year.  Depression- Your emotional health is as important as your physical health. If you're feeling down, or losing interest in things you would normally enjoy please talk to your healthcare provider.  Eye exam- Visit your eye doctor every year.  Safe sex- If you may be exposed to a sexually transmitted infection, use a condom.  Seat belts- Seat belts can save your life; always wear one.  Smoke/Carbon Monoxide detectors- These detectors need to be installed on the appropriate level of your home.  Replace batteries at least once a year.  Skin cancer- When out in the sun, cover up and use sunscreen 15 SPF or higher.  Violence- If anyone is threatening you, please tell your healthcare provider.  Living Will/ Health care power of attorney- Speak with your healthcare provider and family. If you have lab work done today you will be contacted with your lab results within the next 2 weeks.  If you have not heard from [place] then please contact us. The fastest way to get your results is to register for My Chart.   IF you received an x-ray today, you will receive  an invoice from [facility]. Please contact [facility] at [phone] with questions or concerns regarding your invoice.   IF you received labwork today, you will receive an invoice from [place]. Please contact [facility] at [phone] with questions or concerns regarding your invoice.   Our billing staff will not be able to assist you with questions regarding bills from these companies.  You will be contacted with the lab results as soon as they are available. The fastest way to get your results is to activate your My Chart account. Instructions are located on the last page of this paperwork. If you have not heard from [place] regarding the results in 2 weeks, please contact this office.

## 2019-08-30 ENCOUNTER — Encounter: Payer: Self-pay | Admitting: Family Medicine

## 2019-08-30 LAB — PSA: Prostate Specific Ag, Serum: 5.3 ng/mL — ABNORMAL HIGH (ref 0.0–4.0)

## 2019-09-01 ENCOUNTER — Encounter: Payer: Self-pay | Admitting: Family Medicine

## 2019-09-03 DIAGNOSIS — G4733 Obstructive sleep apnea (adult) (pediatric): Secondary | ICD-10-CM | POA: Diagnosis not present

## 2019-09-12 ENCOUNTER — Ambulatory Visit: Payer: BC Managed Care – PPO | Admitting: Family Medicine

## 2019-09-12 ENCOUNTER — Other Ambulatory Visit: Payer: Self-pay

## 2019-09-12 ENCOUNTER — Encounter: Payer: Self-pay | Admitting: Family Medicine

## 2019-09-12 VITALS — BP 128/72 | HR 77 | Temp 98.0°F | Ht 72.0 in | Wt 223.6 lb

## 2019-09-12 DIAGNOSIS — R350 Frequency of micturition: Secondary | ICD-10-CM | POA: Diagnosis not present

## 2019-09-12 DIAGNOSIS — N419 Inflammatory disease of prostate, unspecified: Secondary | ICD-10-CM | POA: Diagnosis not present

## 2019-09-12 DIAGNOSIS — N401 Enlarged prostate with lower urinary tract symptoms: Secondary | ICD-10-CM

## 2019-09-12 DIAGNOSIS — R102 Pelvic and perineal pain: Secondary | ICD-10-CM | POA: Diagnosis not present

## 2019-09-12 LAB — POCT CBC
Granulocyte percent: 71.1 %G (ref 37–80)
HCT, POC: 42.4 % — AB (ref 29–41)
Hemoglobin: 14.3 g/dL (ref 11–14.6)
Lymph, poc: 1.6 (ref 0.6–3.4)
MCH, POC: 30.6 pg (ref 27–31.2)
MCHC: 33.8 g/dL (ref 31.8–35.4)
MCV: 90.5 fL (ref 76–111)
MID (cbc): 0.1 (ref 0–0.9)
MPV: 7.9 fL (ref 0–99.8)
POC Granulocyte: 4.3 (ref 2–6.9)
POC LYMPH PERCENT: 26.9 %L (ref 10–50)
POC MID %: 2 %M (ref 0–12)
Platelet Count, POC: 180 10*3/uL (ref 142–424)
RBC: 4.69 M/uL (ref 4.69–6.13)
RDW, POC: 13.1 %
WBC: 6.1 10*3/uL (ref 4.6–10.2)

## 2019-09-12 LAB — POCT URINALYSIS DIP (MANUAL ENTRY)
Bilirubin, UA: NEGATIVE
Blood, UA: NEGATIVE
Glucose, UA: NEGATIVE mg/dL
Ketones, POC UA: NEGATIVE mg/dL
Leukocytes, UA: NEGATIVE
Nitrite, UA: NEGATIVE
Protein Ur, POC: NEGATIVE mg/dL
Spec Grav, UA: 1.01 (ref 1.010–1.025)
Urobilinogen, UA: 0.2 E.U./dL
pH, UA: 5.5 (ref 5.0–8.0)

## 2019-09-12 LAB — POC MICROSCOPIC URINALYSIS (UMFC): Mucus: ABSENT

## 2019-09-12 MED ORDER — CIPROFLOXACIN HCL 500 MG PO TABS: 500.0000 mg | ORAL_TABLET | Freq: Two times a day (BID) | ORAL | 0 refills | Status: DC

## 2019-09-12 MED ORDER — TAMSULOSIN HCL 0.4 MG PO CAPS: 0.4000 mg | ORAL_CAPSULE | Freq: Every day | ORAL | 3 refills | Status: DC

## 2019-09-12 NOTE — Patient Instructions (Addendum)
Have a component of infection as well as the enlarged prostate and lower urinary symptoms.  Start Flomax 1 pill/day, restart Cipro for another 10-day course, but I will refer you to urology as well during that time.  I am going to repeat the PSA level today as well as kidney function.  If any fevers, chills, inability to make urine, or any acute worsening symptoms, proceed to the emergency room as we discussed.  Return to the clinic or go to the nearest emergency room if any of your symptoms worsen or new symptoms occur.   Prostatitis  Prostatitis is swelling or inflammation of the prostate gland. The prostate is a walnut-sized gland that is involved in the production of semen. It is located below a man's bladder, in front of the rectum. There are four types of prostatitis:  Chronic nonbacterial prostatitis. This is the most common type of prostatitis. It may be associated with a viral infection or autoimmune disorder.  Acute bacterial prostatitis. This is the least common type of prostatitis. It starts quickly and is usually associated with a bladder infection, high fever, and shaking chills. It can occur at any age.  Chronic bacterial prostatitis. This type usually results from acute bacterial prostatitis that happens repeatedly (is recurrent) or has not been treated properly. It can occur in men of any age but is most common among middle-aged men whose prostate has begun to get larger. The symptoms are not as severe as symptoms caused by acute bacterial prostatitis.  Prostatodynia or chronic pelvic pain syndrome (CPPS). This type is also called pelvic floor disorder. It is associated with increased muscular tone in the pelvis surrounding the prostate. What are the causes? Bacterial prostatitis is caused by infection from bacteria. Chronic nonbacterial prostatitis may be caused by:  Urinary tract infections (UTIs).  Nerve damage.  A response by the body's disease-fighting system  (autoimmune response).  Chemicals in the urine. The causes of the other types of prostatitis are usually not known. What are the signs or symptoms? Symptoms of this condition vary depending upon the type of prostatitis. If you have acute bacterial prostatitis, you may experience:  Urinary symptoms, such as: ? Painful urination. ? Burning during urination. ? Frequent and sudden urges to urinate. ? Inability to start urinating. ? A weak or interrupted stream of urine.  Vomiting.  Nausea.  Fever.  Chills.  Inability to empty the bladder completely.  Pain in the: ? Muscles or joints. ? Lower back. ? Lower abdomen. If you have any of the other types of prostatitis, you may experience:  Urinary symptoms, such as: ? Sudden urges to urinate. ? Frequent urination. ? Difficulty starting urination. ? Weak urine stream. ? Dribbling after urination.  Discharge from the urethra. The urethra is a tube that opens at the end of the penis.  Pain in the: ? Testicles. ? Penis or tip of the penis. ? Rectum. ? Area in front of the rectum and below the scrotum (perineum).  Problems with sexual function.  Painful ejaculation.  Bloody semen. How is this diagnosed? This condition may be diagnosed based on:  A physical and medical exam.  Your symptoms.  A urine test to check for bacteria.  An exam in which a health care provider uses a finger to feel the prostate (digital rectal exam).  A test of a sample of semen.  Blood tests.  Ultrasound.  Removal of prostate tissue to be examined under a microscope (biopsy).  Tests to check how your  body handles urine (urodynamic tests).  A test to look inside your bladder or urethra (cystoscopy). How is this treated? Treatment for this condition depends on the type of prostatitis. Treatment may involve:  Medicines to relieve pain or inflammation.  Medicines to help relax your muscles.  Physical therapy.  Heat  therapy.  Techniques to help you control certain body functions (biofeedback).  Relaxation exercises.  Antibiotic medicine, if your condition is caused by bacteria.  Warm water baths (sitz baths). Sitz baths help with relaxing your pelvic floor muscles, which helps to relieve pressure on the prostate. Follow these instructions at home:   Take over-the-counter and prescription medicines only as told by your health care provider.  If you were prescribed an antibiotic, take it as told by your health care provider. Do not stop taking the antibiotic even if you start to feel better.  If physical therapy, biofeedback, or relaxation exercises were prescribed, do exercises as instructed.  Take sitz baths as directed by your health care provider. For a sitz bath, sit in warm water that is deep enough to cover your hips and buttocks.  Keep all follow-up visits as told by your health care provider. This is important. Contact a health care provider if:  Your symptoms get worse.  You have a fever. Get help right away if:  You have chills.  You feel nauseous.  You vomit.  You feel light-headed or feel like you are going to faint.  You are unable to urinate.  You have blood or blood clots in your urine. This information is not intended to replace advice given to you by your health care provider. Make sure you discuss any questions you have with your health care provider. Document Revised: 10/01/2017 Document Reviewed: 04/08/2016 Elsevier Patient Education  [employer].   If you have lab work done today you will be contacted with your lab results within the next 2 weeks.  If you have not heard from [place] then please contact us. The fastest way to get your results is to register for My Chart.   IF you received an x-ray today, you will receive an invoice from [facility]. Please contact [facility] at [phone] with questions or concerns regarding your invoice.    IF you received labwork today, you will receive an invoice from [place]. Please contact [facility] at [phone] with questions or concerns regarding your invoice.   Our billing staff will not be able to assist you with questions regarding bills from these companies.  You will be contacted with the lab results as soon as they are available. The fastest way to get your results is to activate your My Chart account. Instructions are located on the last page of this paperwork. If you have not heard from [place] regarding the results in 2 weeks, please contact this office.

## 2019-09-12 NOTE — Progress Notes (Signed)
Subjective:  Patient ID: Jawaad Valle, male    DOB: 1956-06-30  Age: 64 y.o. MRN: [id number]  CC:  Chief Complaint  Patient presents with  . Follow-up    on prostate.pt is experiancing urgent frequent urination. pt is no longer constipated since last visit. pt alst states he is having pain while urinating pt states this has gotten worse since the last visit. pt also statates he feels as if after he urinates as though he still has some urine left,but can't get it all out. no pain we deficating.     HPI Reshawn Ghattas presents for   Urinary frequency/urgency/dysuria: Elevated PSA in 2019 at 4.9, reportedly had BPH diagnosis in the past when seen by urology.  Does report some increased frequency, dribbling, nocturia at last visit over the previous month.  Some constipation noted at that time as well which is now improved.  He was treated with Cipro 500 mg twice daily for possible subacute prostatitis, 10 day Rx.   Nocturia 5 times last night, burning and hard to start urination, but able to produce urine. frequent urination this morning. Finished antibiotic about 3 days ago. No improvement on antibiotic, but worse last night.  Longstanding frequency and nighttime urination for years, but fluctuates.  No hematuria.  Incomplete emptying.  No fever. No abd pain - not having constipation. No diarrhea. No penile discharge, no new sexual contacts.   Lab Results  Component Value Date   PSA1 5.3 (H) 08/29/2019   PSA1 4.9 (H) 08/26/2017   PSA 3.2 07/02/2016   PSA 4.35 (H) 06/23/2015   PSA 4.06 (H) 05/13/2014       History Patient Active Problem List   Diagnosis Date Noted  . Fatigue 05/24/2018  . Somnolence, daytime 05/24/2018  . Obstructive sleep apnea treated with continuous positive airway pressure (CPAP) 01/31/2018  . Snoring 09/28/2017  . UARS (upper airway resistance syndrome) 09/28/2017  . Retrognathia 09/28/2017  . Allergic rhinitis due to allergen 09/28/2017  . Obesity (BMI  30.0-34.9) 09/28/2017  . Acute URI 02/09/2017  . Chronic pain of left knee 02/09/2017  . Other seasonal allergic rhinitis 05/24/2014  . OSA on CPAP 05/24/2014  . GERD (gastroesophageal reflux disease)   . Dyslipidemia   . HYPOGODADISM MALE   . Cough 11/06/2008   Past Medical History:  Diagnosis Date  . Allergy    Zyrtec daily.  Flonase PRN.  . BPH (benign prostatic hyperplasia)   . Dyslipidemia   . Erectile dysfunction   . GERD (gastroesophageal reflux disease)   . Hyperlipidemia   . Hypogonadism male   . Obstructive sleep apnea   . OSA on CPAP   . Sleep apnea    Past Surgical History:  Procedure Laterality Date  . CHOLECYSTECTOMY    . COLONOSCOPY    . deviated septum-cyst    . EYE SURGERY     L eye tumor benign.  Marland Kitchen GALLBLADDER SURGERY    . PILONIDAL CYST EXCISION    . VASECTOMY     Allergies  Allergen Reactions  . Penicillins Rash   Prior to Admission medications   Medication Sig Start Date End Date Taking? Authorizing Provider  acetaminophen (TYLENOL) 500 MG tablet Take 500 mg by mouth every 6 (six) hours as needed.   Yes [provider]  aspirin 81 MG tablet Take 81 mg by mouth daily.   Yes [provider]  cetirizine (ZYRTEC) 10 MG tablet Take 1 tablet (10 mg total) by mouth daily. 12/10/16  Yes McVey, Gelene Mink, PA-C  fluticasone (FLONASE) 50 MCG/ACT nasal spray Place 2 sprays into both nostrils daily. 12/10/16  Yes McVey, Gelene Mink, PA-C  montelukast (SINGULAIR) 10 MG tablet TAKE 1 TABLET BY MOUTH EVERYDAY AT BEDTIME 08/10/19  Yes Wendie Agreste, MD  Multiple Vitamin (MULTIVITAMIN) tablet Take 1 tablet by mouth daily.   Yes [provider]  phentermine 37.5 MG capsule Take 37.5 mg by mouth every morning.   Yes [provider]  ciprofloxacin (CIPRO) 500 MG tablet Take 1 tablet (500 mg total) by mouth 2 (two) times daily. Patient not taking: Reported on 09/12/2019 08/29/19   Wendie Agreste, MD   Social History    Socioeconomic History  . Marital status: Married    Spouse name: Not on file  . Number of children: Not on file  . Years of education: Not on file  . Highest education level: Not on file  Occupational History  . Occupation: Best boy: [employer]  Tobacco Use  . Smoking status: Former Smoker    Quit date: 2002    Years since quitting: 19.1  . Smokeless tobacco: Never Used  Substance and Sexual Activity  . Alcohol use: Yes    Alcohol/week: 4.0 standard drinks    Types: 4 Cans of beer per week  . Drug use: No  . Sexual activity: Yes    Birth control/protection: Other-see comments    Comment: vasectomy  Other Topics Concern  . Not on file  Social History Narrative   Marital status: married x 26 years.      Children: 3 children (23, 21, 16); no grandchildren.      Lives: with wife, 1 child at home       Employment: [employer] x 13 years.      Tobacco: quit smoking 15 years ago.      Alcohol: rarely      Exercise:  3 times per week (weights, cardio).      Seatbelt: 100%      Guns: none   Social Determinants of Radio broadcast assistant Strain:   . Difficulty of Paying Living Expenses: Not on file  Food Insecurity:   . Worried About Charity fundraiser in the Last Year: Not on file  . Ran Out of Food in the Last Year: Not on file  Transportation Needs:   . Lack of Transportation (Medical): Not on file  . Lack of Transportation (Non-Medical): Not on file  Physical Activity:   . Days of Exercise per Week: Not on file  . Minutes of Exercise per Session: Not on file  Stress:   . Feeling of Stress : Not on file  Social Connections:   . Frequency of Communication with Friends and Family: Not on file  . Frequency of Social Gatherings with Friends and Family: Not on file  . Attends Religious Services: Not on file  . Active Member of Clubs or Organizations: Not on file  . Attends Archivist Meetings: Not on file  .  Marital Status: Not on file  Intimate Partner Violence:   . Fear of Current or Ex-Partner: Not on file  . Emotionally Abused: Not on file  . Physically Abused: Not on file  . Sexually Abused: Not on file    Review of Systems   Objective:   Vitals:   09/12/19 1031  BP: 128/72  Pulse: 77  Temp: 98 F (36.7 C)  TempSrc:  Temporal  SpO2: 99%  Weight: 223 lb 9.6 oz (101.4 kg)  Height: 6' (1.829 m)     Physical Exam Vitals reviewed.  Constitutional:      General: He is not in acute distress.    Appearance: Normal appearance. He is well-developed. He is not ill-appearing or toxic-appearing.  HENT:     Head: Normocephalic and atraumatic.  Eyes:     Pupils: Pupils are equal, round, and reactive to light.  Neck:     Vascular: No JVD.  Cardiovascular:     Rate and Rhythm: Normal rate and regular rhythm.     Heart sounds: Normal heart sounds. No murmur.  Pulmonary:     Effort: Pulmonary effort is normal.     Breath sounds: Normal breath sounds. No rales.  Abdominal:     General: There is no distension.     Tenderness: There is abdominal tenderness (minimal suprapubic. ). There is no right CVA tenderness, left CVA tenderness, guarding or rebound.  Skin:    General: Skin is warm and dry.  Neurological:     Mental Status: He is alert and oriented to person, place, and time.  Psychiatric:        Mood and Affect: Mood normal.        Behavior: Behavior normal.    Results for orders placed or performed in visit on 09/12/19  POCT urinalysis dipstick  Result Value Ref Range   Color, UA yellow yellow   Clarity, UA clear clear   Glucose, UA negative negative mg/dL   Bilirubin, UA negative negative   Ketones, POC UA negative negative mg/dL   Spec Grav, UA 1.010 1.010 - 1.025   Blood, UA negative negative   pH, UA 5.5 5.0 - 8.0   Protein Ur, POC negative negative mg/dL   Urobilinogen, UA 0.2 0.2 or 1.0 E.U./dL   Nitrite, UA Negative Negative   Leukocytes, UA Negative  Negative  POCT Microscopic Urinalysis ([facility])  Result Value Ref Range   WBC,UR,HPF,POC None None WBC/hpf   RBC,UR,HPF,POC None None RBC/hpf   Bacteria None None, Too numerous to count   Mucus Absent Absent   Epithelial Cells, UR Per Microscopy Few (A) None, Too numerous to count cells/hpf  POCT CBC  Result Value Ref Range   WBC 6.1 4.6 - 10.2 K/uL   Lymph, poc 1.6 0.6 - 3.4   POC LYMPH PERCENT 26.9 10 - 50 %L   MID (cbc) 0.1 0 - 0.9   POC MID % 2.0 0 - 12 %M   POC Granulocyte 4.3 2 - 6.9   Granulocyte percent 71.1 37 - 80 %G   RBC 4.69 4.69 - 6.13 M/uL   Hemoglobin 14.3 11 - 14.6 g/dL   HCT, POC 42.4 (A) 29 - 41 %   MCV 90.5 76 - 111 fL   MCH, POC 30.6 27 - 31.2 pg   MCHC 33.8 31.8 - 35.4 g/dL   RDW, POC 13.1 %   Platelet Count, POC 180 142 - 424 K/uL   MPV 7.9 0 - 99.8 fL     Assessment & Plan:  Lafe Matias is a 64 y.o. male . Prostatitis, unspecified prostatitis type - Plan: POCT urinalysis dipstick, PSA, POCT Microscopic Urinalysis ([facility]), Basic metabolic panel, POCT CBC, Ambulatory referral to Urology, Urine Culture  Urinary frequency - Plan: POCT urinalysis dipstick, POCT Microscopic Urinalysis ([facility]), Basic metabolic panel, POCT CBC, Ambulatory referral to Urology, tamsulosin (FLOMAX) 0.4 MG CAPS capsule, ciprofloxacin (CIPRO) 500 MG tablet  Suprapubic abdominal pain - Plan: Basic metabolic panel, POCT CBC  Benign prostatic hyperplasia with urinary frequency - Plan: Ambulatory referral to Urology, ciprofloxacin (CIPRO) 500 MG tablet   Suspect component of BPH with lower urinary tract symptoms, secondary prostatitis, some minimal improvement with Cipro, then recurrence/worsening off antibiotics past few days.  Reassuring CBC, afebrile, only minimal suprapubic discomfort.  Reassuring urinalysis.  -Check BMP to rule out postobstructive arthropathy.  -Restart Cipro 500 mg twice daily.  Previously had discussed potential risks of this class of antibiotics  -Check urine culture   -Refer to urology  Start Flomax 0.4 mg daily for lower urinary tract symptoms  -RTC/ER precautions given.  Urinary retention precautions given.   No orders of the defined types were placed in this encounter.  Patient Instructions       If you have lab work done today you will be contacted with your lab results within the next 2 weeks.  If you have not heard from [place] then please contact us. The fastest way to get your results is to register for My Chart.   IF you received an x-ray today, you will receive an invoice from [facility]. Please contact [facility] at [phone] with questions or concerns regarding your invoice.   IF you received labwork today, you will receive an invoice from [place]. Please contact [facility] at [phone] with questions or concerns regarding your invoice.   Our billing staff will not be able to assist you with questions regarding bills from these companies.  You will be contacted with the lab results as soon as they are available. The fastest way to get your results is to activate your My Chart account. Instructions are located on the last page of this paperwork. If you have not heard from [place] regarding the results in 2 weeks, please contact this office.         Signed, Merri Ray, MD [facility]

## 2019-09-13 LAB — URINE CULTURE: Organism ID, Bacteria: NO GROWTH

## 2019-09-17 DIAGNOSIS — R972 Elevated prostate specific antigen [PSA]: Secondary | ICD-10-CM | POA: Diagnosis not present

## 2019-09-17 DIAGNOSIS — R350 Frequency of micturition: Secondary | ICD-10-CM | POA: Diagnosis not present

## 2019-09-17 DIAGNOSIS — N401 Enlarged prostate with lower urinary tract symptoms: Secondary | ICD-10-CM | POA: Diagnosis not present

## 2019-09-18 LAB — BASIC METABOLIC PANEL
BUN/Creatinine Ratio: 14 (ref 10–24)
BUN: 17 mg/dL (ref 8–27)
CO2: 20 mmol/L (ref 20–29)
Calcium: 9.4 mg/dL (ref 8.6–10.2)
Chloride: 103 mmol/L (ref 96–106)
Creatinine, Ser: 1.2 mg/dL (ref 0.76–1.27)
GFR calc Af Amer: 73 mL/min/{1.73_m2} (ref >59)
GFR calc non Af Amer: 64 mL/min/{1.73_m2} (ref >59)
Glucose: 91 mg/dL (ref 65–99)
Potassium: 4.4 mmol/L (ref 3.5–5.2)
Sodium: 138 mmol/L (ref 134–144)

## 2019-09-18 LAB — PSA: Prostate Specific Ag, Serum: 5 ng/mL — ABNORMAL HIGH (ref 0.0–4.0)

## 2019-09-21 DIAGNOSIS — H6123 Impacted cerumen, bilateral: Secondary | ICD-10-CM | POA: Diagnosis not present

## 2019-09-21 DIAGNOSIS — H903 Sensorineural hearing loss, bilateral: Secondary | ICD-10-CM | POA: Diagnosis not present

## 2019-09-21 DIAGNOSIS — H9313 Tinnitus, bilateral: Secondary | ICD-10-CM | POA: Diagnosis not present

## 2019-09-21 DIAGNOSIS — R42 Dizziness and giddiness: Secondary | ICD-10-CM | POA: Diagnosis not present

## 2019-10-02 ENCOUNTER — Encounter: Payer: Self-pay | Admitting: Family Medicine

## 2019-10-02 DIAGNOSIS — B360 Pityriasis versicolor: Secondary | ICD-10-CM

## 2019-10-02 MED ORDER — KETOCONAZOLE 200 MG PO TABS: 400.0000 mg | ORAL_TABLET | Freq: Every day | ORAL | 0 refills | Status: AC

## 2019-10-15 DIAGNOSIS — M9901 Segmental and somatic dysfunction of cervical region: Secondary | ICD-10-CM | POA: Diagnosis not present

## 2019-10-15 DIAGNOSIS — M9907 Segmental and somatic dysfunction of upper extremity: Secondary | ICD-10-CM | POA: Diagnosis not present

## 2019-10-15 DIAGNOSIS — M9902 Segmental and somatic dysfunction of thoracic region: Secondary | ICD-10-CM | POA: Diagnosis not present

## 2019-10-15 DIAGNOSIS — M5412 Radiculopathy, cervical region: Secondary | ICD-10-CM | POA: Diagnosis not present

## 2019-10-18 DIAGNOSIS — M9902 Segmental and somatic dysfunction of thoracic region: Secondary | ICD-10-CM | POA: Diagnosis not present

## 2019-10-18 DIAGNOSIS — M9901 Segmental and somatic dysfunction of cervical region: Secondary | ICD-10-CM | POA: Diagnosis not present

## 2019-10-18 DIAGNOSIS — M5412 Radiculopathy, cervical region: Secondary | ICD-10-CM | POA: Diagnosis not present

## 2019-10-18 DIAGNOSIS — M9907 Segmental and somatic dysfunction of upper extremity: Secondary | ICD-10-CM | POA: Diagnosis not present

## 2019-10-22 DIAGNOSIS — M5412 Radiculopathy, cervical region: Secondary | ICD-10-CM | POA: Diagnosis not present

## 2019-10-22 DIAGNOSIS — M9907 Segmental and somatic dysfunction of upper extremity: Secondary | ICD-10-CM | POA: Diagnosis not present

## 2019-10-22 DIAGNOSIS — M9902 Segmental and somatic dysfunction of thoracic region: Secondary | ICD-10-CM | POA: Diagnosis not present

## 2019-10-22 DIAGNOSIS — M9901 Segmental and somatic dysfunction of cervical region: Secondary | ICD-10-CM | POA: Diagnosis not present

## 2019-10-25 DIAGNOSIS — M5412 Radiculopathy, cervical region: Secondary | ICD-10-CM | POA: Diagnosis not present

## 2019-10-25 DIAGNOSIS — M9902 Segmental and somatic dysfunction of thoracic region: Secondary | ICD-10-CM | POA: Diagnosis not present

## 2019-10-25 DIAGNOSIS — M9901 Segmental and somatic dysfunction of cervical region: Secondary | ICD-10-CM | POA: Diagnosis not present

## 2019-10-25 DIAGNOSIS — M9907 Segmental and somatic dysfunction of upper extremity: Secondary | ICD-10-CM | POA: Diagnosis not present

## 2019-10-30 DIAGNOSIS — M5412 Radiculopathy, cervical region: Secondary | ICD-10-CM | POA: Diagnosis not present

## 2019-10-30 DIAGNOSIS — M9907 Segmental and somatic dysfunction of upper extremity: Secondary | ICD-10-CM | POA: Diagnosis not present

## 2019-10-30 DIAGNOSIS — M9902 Segmental and somatic dysfunction of thoracic region: Secondary | ICD-10-CM | POA: Diagnosis not present

## 2019-10-30 DIAGNOSIS — M9901 Segmental and somatic dysfunction of cervical region: Secondary | ICD-10-CM | POA: Diagnosis not present

## 2019-11-05 DIAGNOSIS — M9907 Segmental and somatic dysfunction of upper extremity: Secondary | ICD-10-CM | POA: Diagnosis not present

## 2019-11-05 DIAGNOSIS — M9902 Segmental and somatic dysfunction of thoracic region: Secondary | ICD-10-CM | POA: Diagnosis not present

## 2019-11-05 DIAGNOSIS — M5412 Radiculopathy, cervical region: Secondary | ICD-10-CM | POA: Diagnosis not present

## 2019-11-05 DIAGNOSIS — M9901 Segmental and somatic dysfunction of cervical region: Secondary | ICD-10-CM | POA: Diagnosis not present

## 2019-11-07 DIAGNOSIS — C61 Malignant neoplasm of prostate: Secondary | ICD-10-CM | POA: Diagnosis not present

## 2019-11-07 DIAGNOSIS — R972 Elevated prostate specific antigen [PSA]: Secondary | ICD-10-CM | POA: Diagnosis not present

## 2019-11-08 DIAGNOSIS — M5412 Radiculopathy, cervical region: Secondary | ICD-10-CM | POA: Diagnosis not present

## 2019-11-08 DIAGNOSIS — M9907 Segmental and somatic dysfunction of upper extremity: Secondary | ICD-10-CM | POA: Diagnosis not present

## 2019-11-08 DIAGNOSIS — M9901 Segmental and somatic dysfunction of cervical region: Secondary | ICD-10-CM | POA: Diagnosis not present

## 2019-11-08 DIAGNOSIS — M9902 Segmental and somatic dysfunction of thoracic region: Secondary | ICD-10-CM | POA: Diagnosis not present

## 2019-11-21 DIAGNOSIS — B952 Enterococcus as the cause of diseases classified elsewhere: Secondary | ICD-10-CM | POA: Diagnosis not present

## 2019-11-21 DIAGNOSIS — N390 Urinary tract infection, site not specified: Secondary | ICD-10-CM | POA: Diagnosis not present

## 2019-11-21 DIAGNOSIS — C61 Malignant neoplasm of prostate: Secondary | ICD-10-CM | POA: Diagnosis not present

## 2019-11-21 DIAGNOSIS — R350 Frequency of micturition: Secondary | ICD-10-CM | POA: Diagnosis not present

## 2019-11-24 ENCOUNTER — Encounter: Payer: Self-pay | Admitting: Family Medicine

## 2019-11-26 ENCOUNTER — Ambulatory Visit: Payer: BC Managed Care – PPO | Admitting: Registered Nurse

## 2019-11-26 ENCOUNTER — Encounter: Payer: Self-pay | Admitting: Registered Nurse

## 2019-11-26 ENCOUNTER — Other Ambulatory Visit: Payer: Self-pay

## 2019-11-26 VITALS — BP 154/78 | HR 90 | Temp 97.8°F | Resp 16 | Ht 72.0 in | Wt 218.2 lb

## 2019-11-26 DIAGNOSIS — R350 Frequency of micturition: Secondary | ICD-10-CM | POA: Diagnosis not present

## 2019-11-26 LAB — POCT URINALYSIS DIP (CLINITEK)
Bilirubin, UA: NEGATIVE
Blood, UA: NEGATIVE
Glucose, UA: NEGATIVE mg/dL
Nitrite, UA: NEGATIVE
POC PROTEIN,UA: NEGATIVE
Spec Grav, UA: 1.015 (ref 1.010–1.025)
Urobilinogen, UA: 0.2 E.U./dL
pH, UA: 6.5 (ref 5.0–8.0)

## 2019-11-26 MED ORDER — SULFAMETHOXAZOLE-TRIMETHOPRIM 800-160 MG PO TABS: 1.0000 | ORAL_TABLET | Freq: Two times a day (BID) | ORAL | 0 refills | Status: DC

## 2019-11-26 NOTE — Telephone Encounter (Signed)
Pt requesting to be able to take his Flomax twice a day on occasion due to some burning, discomfort and the feeling of being unable to empty bladder. Pt had biopsy that found early stages of prostate cancer and has since started having these issues. Can he take two Flomax or is there another remedy to his discomfort?

## 2019-11-26 NOTE — Patient Instructions (Addendum)
George Garner -   I will defer to Urology's plan to treat you today.  Your urinalysis at our clinic supports their findings: Urinalysis    Component Value Date/Time   APPEARANCEUR Clear 01/08/2019 1023   GLUCOSEU Negative 01/08/2019 1023   BILIRUBINUR negative 11/26/2019 1136   BILIRUBINUR Negative 01/08/2019 1023   KETONESUR trace (5) (A) 11/26/2019 1136   PROTEINUR negative 09/12/2019 1133   PROTEINUR Negative 01/08/2019 1023   UROBILINOGEN 0.2 11/26/2019 1136   NITRITE Negative 11/26/2019 1136   NITRITE Negative 01/08/2019 1023   LEUKOCYTESUR Trace (A) 11/26/2019 1136   LEUKOCYTESUR Negative 01/08/2019 1023     Please finish out the antibiotics even if you're feeling better.  You can take Phenazopyridine, available over the counter as AZO or Pyridium, for relief from any pressure, pain, or discomfort.  Please let us know if you are not feeling any improvement or are feeling any worse.  Thank you   Kathrin Ruddy, NP  If you have lab work done today you will be contacted with your lab results within the next 2 weeks.  If you have not heard from [place] then please contact us. The fastest way to get your results is to register for My Chart.   IF you received an x-ray today, you will receive an invoice from [facility]. Please contact [facility] at [phone] with questions or concerns regarding your invoice.   IF you received labwork today, you will receive an invoice from [place]. Please contact [facility] at [phone] with questions or concerns regarding your invoice.   Our billing staff will not be able to assist you with questions regarding bills from these companies.  You will be contacted with the lab results as soon as they are available. The fastest way to get your results is to activate your My Chart account. Instructions are located on the last page of this paperwork. If you have not heard from [place] regarding the results in 2 weeks, please contact this  office.

## 2019-11-26 NOTE — Telephone Encounter (Signed)
Pt is scheduled for 10 am this morning.

## 2019-11-26 NOTE — Telephone Encounter (Signed)
Please try to schedule him today with any provider if needed. Needs eval with urine testing, as this could be infected prostate, not just enlarged prostate.  Should be seen today.

## 2019-11-26 NOTE — Progress Notes (Signed)
Established Patient Office Visit  Subjective:  Patient ID: George Garner, male    DOB: 1956-01-29  Age: 64 y.o. MRN: [id number]  CC:  Chief Complaint  Patient presents with  . Urinary Frequency    Patient states for the this past weekend he has had some urine frequency , pressure and pain when urinating. Per patient he has not taken anything      HPI Drue Huizinga presents for UTI symptoms   Has had urinary frequency, urgency, some discomfort. No flank pain, nvd, chills, fever. Has been ongoing for 4-5 days.   Has been seen by urology in the past - has BPH, hx of UTI, hx of prostatitis.   Past Medical History:  Diagnosis Date  . Allergy    Zyrtec daily.  Flonase PRN.  . BPH (benign prostatic hyperplasia)   . Dyslipidemia   . Erectile dysfunction   . GERD (gastroesophageal reflux disease)   . Hyperlipidemia   . Hypogonadism male   . Obstructive sleep apnea   . OSA on CPAP   . Sleep apnea     Past Surgical History:  Procedure Laterality Date  . CHOLECYSTECTOMY    . COLONOSCOPY    . deviated septum-cyst    . EYE SURGERY     L eye tumor benign.  Marland Kitchen GALLBLADDER SURGERY    . PILONIDAL CYST EXCISION    . VASECTOMY      Family History  Problem Relation Age of Onset  . Diabetes Mother   . Dementia Mother   . Arthritis Brother   . Colon cancer Neg Hx   . Colon polyps Neg Hx   . Esophageal cancer Neg Hx   . Stomach cancer Neg Hx   . Rectal cancer Neg Hx     Social History   Socioeconomic History  . Marital status: Married    Spouse name: Not on file  . Number of children: Not on file  . Years of education: Not on file  . Highest education level: Not on file  Occupational History  . Occupation: Best boy: [employer]  Tobacco Use  . Smoking status: Former Smoker    Quit date: 2002    Years since quitting: 19.3  . Smokeless tobacco: Never Used  Substance and Sexual Activity  . Alcohol use: Yes    Alcohol/week: 4.0 standard drinks   Types: 4 Cans of beer per week  . Drug use: No  . Sexual activity: Yes    Birth control/protection: Other-see comments    Comment: vasectomy  Other Topics Concern  . Not on file  Social History Narrative   Marital status: married x 26 years.      Children: 3 children (23, 21, 16); no grandchildren.      Lives: with wife, 1 child at home       Employment: [employer] x 13 years.      Tobacco: quit smoking 15 years ago.      Alcohol: rarely      Exercise:  3 times per week (weights, cardio).      Seatbelt: 100%      Guns: none   Social Determinants of Radio broadcast assistant Strain:   . Difficulty of Paying Living Expenses:   Food Insecurity:   . Worried About Charity fundraiser in the Last Year:   . Arboriculturist in the Last Year:   Transportation Needs:   . Lack  of Transportation (Medical):   Marland Kitchen Lack of Transportation (Non-Medical):   Physical Activity:   . Days of Exercise per Week:   . Minutes of Exercise per Session:   Stress:   . Feeling of Stress :   Social Connections:   . Frequency of Communication with Friends and Family:   . Frequency of Social Gatherings with Friends and Family:   . Attends Religious Services:   . Active Member of Clubs or Organizations:   . Attends Archivist Meetings:   Marland Kitchen Marital Status:   Intimate Partner Violence:   . Fear of Current or Ex-Partner:   . Emotionally Abused:   Marland Kitchen Physically Abused:   . Sexually Abused:     Outpatient Medications Prior to Visit  Medication Sig Dispense Refill  . acetaminophen (TYLENOL) 500 MG tablet Take 500 mg by mouth every 6 (six) hours as needed.    Marland Kitchen aspirin 81 MG tablet Take 81 mg by mouth daily.    . cetirizine (ZYRTEC) 10 MG tablet Take 1 tablet (10 mg total) by mouth daily. 30 tablet 1  . ciprofloxacin (CIPRO) 500 MG tablet Take 1 tablet (500 mg total) by mouth 2 (two) times daily. 20 tablet 0  . fluticasone (FLONASE) 50 MCG/ACT nasal spray Place 2 sprays into  both nostrils daily. 16 g 1  . montelukast (SINGULAIR) 10 MG tablet TAKE 1 TABLET BY MOUTH EVERYDAY AT BEDTIME 90 tablet 1  . Multiple Vitamin (MULTIVITAMIN) tablet Take 1 tablet by mouth daily.    . phentermine 37.5 MG capsule Take 37.5 mg by mouth every morning.    . tamsulosin (FLOMAX) 0.4 MG CAPS capsule Take 1 capsule (0.4 mg total) by mouth daily. 30 capsule 3   Facility-Administered Medications Prior to Visit  Medication Dose Route Frequency Provider Last Rate Last Admin  . 0.9 %  sodium chloride infusion  500 mL Intravenous Once Doran Stabler, MD        Allergies  Allergen Reactions  . Penicillins Rash    ROS Review of Systems Per hpi     Objective:    Physical Exam  Constitutional: He is oriented to person, place, and time. He appears well-developed and well-nourished. No distress.  Cardiovascular: Normal rate and regular rhythm.  Pulmonary/Chest: Effort normal. No respiratory distress.  Musculoskeletal:        General: No tenderness. Normal range of motion.  Neurological: He is alert and oriented to person, place, and time.  Skin: Skin is warm and dry. No rash noted. He is not diaphoretic. No erythema. No pallor.  Psychiatric: He has a normal mood and affect. His behavior is normal. Judgment and thought content normal.  Nursing note and vitals reviewed.   BP (!) 154/78   Pulse 90   Temp 97.8 F (36.6 C) (Temporal)   Resp 16   Ht 6' (1.829 m)   Wt 218 lb 3.2 oz (99 kg)   SpO2 97%   BMI 29.59 kg/m  Wt Readings from Last 3 Encounters:  11/26/19 218 lb 3.2 oz (99 kg)  09/12/19 223 lb 9.6 oz (101.4 kg)  08/29/19 223 lb (101.2 kg)     There are no preventive care reminders to display for this patient.  There are no preventive care reminders to display for this patient.  Lab Results  Component Value Date   TSH 2.540 01/08/2019   Lab Results  Component Value Date   WBC 6.1 09/12/2019   HGB 14.3 09/12/2019   HCT  42.4 (A) 09/12/2019   MCV 90.5  09/12/2019   PLT 204 01/08/2019   Lab Results  Component Value Date   NA 138 09/12/2019   K 4.4 09/12/2019   CO2 20 09/12/2019   GLUCOSE 91 09/12/2019   BUN 17 09/12/2019   CREATININE 1.20 09/12/2019   BILITOT 0.4 08/10/2019   ALKPHOS 63 08/10/2019   AST 18 08/10/2019   ALT 25 08/10/2019   PROT 6.8 08/10/2019   ALBUMIN 4.6 08/10/2019   CALCIUM 9.4 09/12/2019   Lab Results  Component Value Date   CHOL 170 08/10/2019   Lab Results  Component Value Date   HDL 42 08/10/2019   Lab Results  Component Value Date   LDLCALC 113 (H) 08/10/2019   Lab Results  Component Value Date   TRIG 77 08/10/2019   Lab Results  Component Value Date   CHOLHDL 4.0 08/10/2019   Lab Results  Component Value Date   HGBA1C 5.6 06/23/2015      Assessment & Plan:   Problem List Items Addressed This Visit    None    Visit Diagnoses    Urine frequency    -  Primary   Relevant Medications   sulfamethoxazole-trimethoprim (BACTRIM DS) 800-160 MG tablet   Other Relevant Orders   POCT URINALYSIS DIP (CLINITEK) (Completed)   Urine Culture      Meds ordered this encounter  Medications  . sulfamethoxazole-trimethoprim (BACTRIM DS) 800-160 MG tablet    Sig: Take 1 tablet by mouth 2 (two) times daily.    Dispense:  14 tablet    Refill:  0    Order Specific Question:   Supervising Provider    Answer:   Forrest Moron [id number]    Follow-up: No follow-ups on file.   PLAN  While our POCT UA was running, his urologist returned a call regarding lab results that he had done there - they ran urine C+S, dx UTI, and have sent tx.  He will follow their plan, follow up with [place] PRN  Patient encouraged to call clinic with any questions, comments, or concerns.   Maximiano Coss, NP

## 2019-11-28 LAB — URINE CULTURE

## 2019-12-04 ENCOUNTER — Other Ambulatory Visit: Payer: Self-pay | Admitting: Family Medicine

## 2019-12-04 DIAGNOSIS — R350 Frequency of micturition: Secondary | ICD-10-CM

## 2019-12-04 NOTE — Telephone Encounter (Signed)
Requested Prescriptions  Pending Prescriptions Disp Refills  . tamsulosin (FLOMAX) 0.4 MG CAPS capsule [Pharmacy Med Name: TAMSULOSIN HCL 0.4 MG CAPSULE] 90 capsule 1    Sig: TAKE 1 CAPSULE BY MOUTH EVERY DAY     Urology: Alpha-Adrenergic Blocker Failed - 12/04/2019  1:42 AM      Failed - Last BP in normal range    BP Readings from Last 1 Encounters:  11/26/19 (!) 154/78         Passed - Valid encounter within last 12 months    Recent Outpatient Visits          1 week ago Urine frequency   [facility], NP   2 months ago Prostatitis, unspecified prostatitis type   [facility], Ranell Patrick, MD   3 months ago Annual physical exam   [facility], MD   3 months ago Hyperlipidemia, unspecified hyperlipidemia type   [facility], Ranell Patrick, MD   9 months ago Strain of left triceps, subsequent encounter   [facility], Lilia Argue, MD

## 2019-12-07 DIAGNOSIS — C61 Malignant neoplasm of prostate: Secondary | ICD-10-CM | POA: Diagnosis not present

## 2019-12-11 ENCOUNTER — Other Ambulatory Visit: Payer: Self-pay | Admitting: Urology

## 2019-12-25 NOTE — Progress Notes (Addendum)
PCP - Merri Ray, MD Cardiologist -   Chest x-ray -  EKG - 01-08-19 Stress Test -  ECHO -  Cardiac Cath -   Sleep Study -  CPAP -   Fasting Blood Sugar -  Checks Blood Sugar _____ times a day  Blood Thinner Instructions: Aspirin Instructions: 81 mg  Last Dose:Pt plan to hold on 01-02-20.  Anesthesia review:   Patient denies shortness of breath, fever, cough and chest pain at PAT appointment   Patient verbalized understanding of instructions that were given to them at the PAT appointment. Patient was also instructed that they will need to review over the PAT instructions again at home before surgery.

## 2019-12-25 NOTE — Patient Instructions (Addendum)
DUE TO COVID-19 ONLY ONE VISITOR IS ALLOWED TO COME WITH YOU AND STAY IN THE WAITING ROOM ONLY DURING PRE OP AND PROCEDURE DAY OF SURGERY. THE 1 VISITOR MAY VISIT WITH YOU AFTER SURGERY IN YOUR PRIVATE ROOM DURING VISITING HOURS ONLY!  YOU NEED TO HAVE A COVID 19 TEST ON 01-07-20 @ 3:00 PM, THIS TEST MUST BE DONE BEFORE SURGERY, COME  [address], [address] [place] , [id number].  ([place]) ONCE YOUR COVID TEST IS COMPLETED, PLEASE BEGIN THE QUARANTINE INSTRUCTIONS AS OUTLINED IN YOUR HANDOUT.                Sophia Braford  12/25/2019   Your procedure is scheduled on: 01-10-20   Report to [facility] Main  Entrance    Report to Admitting at  5:30 AM     Call this number if you have problems the morning of surgery [phone]    PLEASE FOLLOW YOUR PREP, PER YOUR SURGEON'S INSTRUCTIONS   Remember: Do not eat food or drink liquids :After Midnight.    Take these medicines the morning of surgery with A SIP OF WATER: TAMSULOSIN. YOU MAY ALSO USE YOUR NASAL SPRAY   BRUSH YOUR TEETH MORNING OF SURGERY AND RINSE YOUR MOUTH OUT, NO CHEWING GUM CANDY OR MINTS.                                You may not have any metal on your body including hair pins and              piercings     Do not wear jewelry, cologne, lotions, powders or deodorant                    Men may shave face and neck.   Do not bring valuables to the hospital. [place].  Contacts, dentures or bridgework may not be worn into surgery.  Pt may bring an overnight bag      Special Instructions: N/A              Please read over the following fact sheets you were given: _____________________________________________________________________             [facility] - Preparing for Surgery Before surgery, you can play an important role.  Because skin is not sterile, your skin needs to be as free of germs as possible.  You can reduce the number of germs on  your skin by washing with CHG (chlorahexidine gluconate) soap before surgery.  CHG is an antiseptic cleaner which kills germs and bonds with the skin to continue killing germs even after washing. Please DO NOT use if you have an allergy to CHG or antibacterial soaps.  If your skin becomes reddened/irritated stop using the CHG and inform your nurse when you arrive at Short Stay. Do not shave (including legs and underarms) for at least 48 hours prior to the first CHG shower.  You may shave your face/neck. Please follow these instructions carefully:  1.  Shower with CHG Soap the night before surgery and the  morning of Surgery.  2.  If you choose to wash your hair, wash your hair first as usual with your  normal  shampoo.  3.  After you shampoo, rinse your hair and body thoroughly to remove  the  shampoo.                           4.  Use CHG as you would any other liquid soap.  You can apply chg directly  to the skin and wash                       Gently with a scrungie or clean washcloth.  5.  Apply the CHG Soap to your body ONLY FROM THE NECK DOWN.   Do not use on face/ open                           Wound or open sores. Avoid contact with eyes, ears mouth and genitals (private parts).                       Wash face,  Genitals (private parts) with your normal soap.             6.  Wash thoroughly, paying special attention to the area where your surgery  will be performed.  7.  Thoroughly rinse your body with warm water from the neck down.  8.  DO NOT shower/wash with your normal soap after using and rinsing off  the CHG Soap.                9.  Pat yourself dry with a clean towel.            10.  Wear clean pajamas.            11.  Place clean sheets on your bed the night of your first shower and do not  sleep with pets. Day of Surgery : Do not apply any lotions/deodorants the morning of surgery.  Please wear clean clothes to the hospital/surgery center.  FAILURE TO FOLLOW THESE INSTRUCTIONS MAY  RESULT IN THE CANCELLATION OF YOUR SURGERY PATIENT SIGNATURE_________________________________  NURSE SIGNATURE__________________________________  ________________________________________________________________________

## 2019-12-26 DIAGNOSIS — M62838 Other muscle spasm: Secondary | ICD-10-CM | POA: Diagnosis not present

## 2019-12-26 DIAGNOSIS — M6281 Muscle weakness (generalized): Secondary | ICD-10-CM | POA: Diagnosis not present

## 2019-12-26 DIAGNOSIS — M6289 Other specified disorders of muscle: Secondary | ICD-10-CM | POA: Diagnosis not present

## 2019-12-26 DIAGNOSIS — C61 Malignant neoplasm of prostate: Secondary | ICD-10-CM | POA: Diagnosis not present

## 2020-01-02 ENCOUNTER — Encounter (HOSPITAL_COMMUNITY): Payer: Self-pay

## 2020-01-02 ENCOUNTER — Encounter (HOSPITAL_COMMUNITY)
Admission: RE | Admit: 2020-01-02 | Discharge: 2020-01-02 | Disposition: A | Discharge status: Home / Self Care | Payer: BC Managed Care – PPO | Source: Ambulatory Visit | Attending: Urology | Admitting: Urology

## 2020-01-02 ENCOUNTER — Other Ambulatory Visit: Payer: Self-pay

## 2020-01-02 DIAGNOSIS — Z01812 Encounter for preprocedural laboratory examination: Secondary | ICD-10-CM | POA: Diagnosis not present

## 2020-01-02 DIAGNOSIS — R350 Frequency of micturition: Secondary | ICD-10-CM | POA: Diagnosis not present

## 2020-01-02 DIAGNOSIS — N139 Obstructive and reflux uropathy, unspecified: Secondary | ICD-10-CM | POA: Diagnosis not present

## 2020-01-02 LAB — BASIC METABOLIC PANEL
Anion gap: 8 (ref 5–15)
BUN: 24 mg/dL — ABNORMAL HIGH (ref 8–23)
CO2: 25 mmol/L (ref 22–32)
Calcium: 8.9 mg/dL (ref 8.9–10.3)
Chloride: 106 mmol/L (ref 98–111)
Creatinine, Ser: 1.31 mg/dL — ABNORMAL HIGH (ref 0.61–1.24)
GFR calc Af Amer: >60 mL/min (ref >60)
GFR calc non Af Amer: 57 mL/min — ABNORMAL LOW (ref >60)
Glucose, Bld: 97 mg/dL (ref 70–99)
Potassium: 4.7 mmol/L (ref 3.5–5.1)
Sodium: 139 mmol/L (ref 135–145)

## 2020-01-02 LAB — CBC
HCT: 44.6 % (ref 39.0–52.0)
Hemoglobin: 14.1 g/dL (ref 13.0–17.0)
MCH: 29.5 pg (ref 26.0–34.0)
MCHC: 31.6 g/dL (ref 30.0–36.0)
MCV: 93.3 fL (ref 80.0–100.0)
Platelets: 173 10*3/uL (ref 150–400)
RBC: 4.78 MIL/uL (ref 4.22–5.81)
RDW: 13.2 % (ref 11.5–15.5)
WBC: 4.8 10*3/uL (ref 4.0–10.5)
nRBC: 0 % (ref 0.0–0.2)

## 2020-01-02 LAB — ABO/RH: ABO/RH(D): AB POS

## 2020-01-02 NOTE — Progress Notes (Signed)
BMP result routed to Dr. Alinda Money for review. Pt advised to hold Phentermine 1 week prior to surgery, per Konrad Felix, PA

## 2020-01-07 ENCOUNTER — Other Ambulatory Visit (HOSPITAL_COMMUNITY)
Admission: RE | Admit: 2020-01-07 | Discharge: 2020-01-07 | Disposition: A | Discharge status: Home / Self Care | Payer: BC Managed Care – PPO | Source: Ambulatory Visit | Attending: Urology | Admitting: Urology

## 2020-01-07 DIAGNOSIS — Z20822 Contact with and (suspected) exposure to covid-19: Secondary | ICD-10-CM | POA: Diagnosis not present

## 2020-01-07 DIAGNOSIS — Z01812 Encounter for preprocedural laboratory examination: Secondary | ICD-10-CM | POA: Diagnosis not present

## 2020-01-08 LAB — SARS CORONAVIRUS 2 (TAT 6-24 HRS): SARS Coronavirus 2: NEGATIVE

## 2020-01-09 NOTE — Anesthesia Preprocedure Evaluation (Addendum)
Anesthesia Evaluation  Patient identified by MRN, date of birth, ID band Patient awake    Reviewed: Allergy & Precautions, NPO status , Patient's Chart, lab work & pertinent test results  History of Anesthesia Complications Negative for: history of anesthetic complications  Airway Mallampati: II  TM Distance: >3 FB Neck ROM: Full    Dental  (+) Dental Advisory Given, Caps   Pulmonary sleep apnea and Continuous Positive Airway Pressure Ventilation , COPD,  COPD inhaler, former smoker,  01/07/2020 SARS coronavirus NEG   breath sounds clear to auscultation       Cardiovascular negative cardio ROS   Rhythm:Regular Rate:Normal     Neuro/Psych negative neurological ROS     GI/Hepatic Neg liver ROS, GERD  Controlled,  Endo/Other  obese  Renal/GU Renal InsufficiencyRenal disease (creat 1.31)   Prostate cancer    Musculoskeletal   Abdominal (+) + obese,   Peds  Hematology negative hematology ROS (+)   Anesthesia Other Findings   Reproductive/Obstetrics                            Anesthesia Physical Anesthesia Plan  ASA: III  Anesthesia Plan: General   Post-op Pain Management:    Induction: Intravenous  PONV Risk Score and Plan: 3 and Ondansetron, Dexamethasone and Scopolamine patch - Pre-op  Airway Management Planned: Oral ETT  Additional Equipment: None  Intra-op Plan:   Post-operative Plan: Extubation in OR  Informed Consent: I have reviewed the patients History and Physical, chart, labs and discussed the procedure including the risks, benefits and alternatives for the proposed anesthesia with the patient or authorized representative who has indicated his/her understanding and acceptance.     Dental advisory given  Plan Discussed with: CRNA and Surgeon  Anesthesia Plan Comments:        Anesthesia Quick Evaluation

## 2020-01-09 NOTE — H&P (Signed)
CC: Prostate Cancer   Physician requesting consult: Dr. Eda Keys  PCP: Dr. Horald Pollen   George Garner is a 64 year old gentleman who presented to Dr. Junious Silk with bothersome LUTS. His PSA was noted to be elevated at 5.3. He underwent TRUS biopsy on 11/07/19 that demonstrated Gleason 3+4=7 adenocarcinoma of the prostate with 3 out of 12 biopsy cores positive for malignancy.   Family history: None.   Imaging studies: None.   PMH: He has a history of hyperlipidemia and sleep apnea.  PSH: Laparoscopic cholecystectomy.   TNM stage: cT1c Nx Mx  PSA: 5.3  Gleason score: 3+4=7 (Grade group 2)  Biopsy (11/07/19): 3/12 cores positive  Left: Benign  Right: R lateral mid (10%, 3+3=6), R base (20%, 3+4=7, PNI), R lateral base (80%, 3+4=7)  Prostate volume: 93.1 cc   Nomogram  OC disease: 69%  EPE: 29%  SVI: 2%  LNI: 3%  PFS (5 year, 10 year): 88%, 79%   Urinary function: IPSS is 31.  Erectile function: SHIM score is 2. His erectile function is refractory to oral medication.     ALLERGIES: Penicillins    MEDICATIONS: Tamsulosin Hcl 0.4 mg capsule  Zyrtec 10 mg tablet  Aspirin Low Dose 81 MG TABS Oral  Fluticasone Propionate 50 mcg/actuation spray, suspension  Levofloxacin 750 mg tablet take 1 tablet PO the morning of your procedure  Multi Vitamin/Minerals TABS Oral  Phentermine Hcl  Saw Palmetto     GU PSH: Prostate Needle Biopsy - 11/07/2019       PSH Notes: Incision And Drainage Of Pilonidal Cyst, Cholecystectomy Laparoscopic   NON-GU PSH: Cholecystectomy (laparoscopic) - 2012 Drain Pilonidal Cyst - 2012 Surgical Pathology, Gross And Microscopic Examination For Prostate Needle - 11/07/2019     GU PMH: Prostate Cancer, I had a long discussion with the patient using the understanding prostate cancer booklet and his path report as a reference. We discussed his stage, grade and prognosis. We discussed the nature risks and benefits of active surveillance, radical prostatectomy,  external beam radiotherapy, and brachytherapy. We discussed the role of androgen deprivation and chemotherapy in prostate cancer. We also discussed other ablative techniques such as HiFU and cryotherapy as well as whole gland versus focal treatment. We discussed specifically how each treatment might affect bowel, bladder and sexual function. We discussed how each treatment might effect salvage treatments and active surveillance might lead to progression and more difficult treatment in the future. All questions answered. He is most interested in surgical therapy and I will refer him over to Dr. Alinda Money for consultation. - 11/21/2019 Elevated PSA - 11/07/2019, discussed nature r/b/a to prostate bx and he elects to proceed. , - 09/17/2019, Elevated prostate specific antigen (PSA), - 2014 BPH w/LUTS, cont tamsulosin for now. Consider 5ari or OAB or procedures - discussed these with patient. - 09/17/2019 Urinary Frequency, as above - 09/17/2019 BPH w/o LUTS, Benign prostatic hypertrophy without lower urinary tract symptoms - 2014 ED due to arterial insufficiency, Erectile dysfunction due to arterial insufficiency - 2014 Obstructive and reflux uropathy, Unspec, Obstructive uropathy - 2014 Primary hypogonadism, Hypogonadism, testicular - 2014      PMH Notes:  2010-12-07 12:18:16 - Note: Neoplasm Of The Prostate Gland   NON-GU PMH: Personal history of other diseases of the nervous system and sense organs, History of sleep apnea - 2014 Personal history of other endocrine, nutritional and metabolic disease, History of hypercholesterolemia - 2014 Encounter for general adult medical examination without abnormal findings, Encounter for preventive health  examination    FAMILY HISTORY: Diabetes - Mother Family Health Status - Father alive at age 29 - Father Family Health Status - Mother's Age - Mother Family Health Status Number - Runs In Family   SOCIAL HISTORY: Marital Status: Married Preferred Language:  English; Ethnicity: Not Hispanic Or Latino; Race: White Current Smoking Status: Patient does not smoke anymore.   Tobacco Use Assessment Completed: Used Tobacco in last 30 days? Does not use smokeless tobacco. Does drink.  Does not use drugs. Drinks 4+ caffeinated drinks per day.     Notes: Former smoker, Occupation:, Marital History - Currently Married, Caffeine Use, Alcohol Use   REVIEW OF SYSTEMS:    GU Review Male:   Patient reports frequent urination. Patient denies hard to postpone urination, burning/ pain with urination, get up at night to urinate, leakage of urine, stream starts and stops, trouble starting your streams, and have to strain to urinate .  Gastrointestinal (Upper):   Patient denies nausea and vomiting.  Gastrointestinal (Lower):   Patient denies diarrhea and constipation.  Constitutional:   Patient denies fever, night sweats, weight loss, and fatigue.  Skin:   Patient denies skin rash/ lesion and itching.  Eyes:   Patient denies blurred vision and double vision.  Ears/ Nose/ Throat:   Patient denies sore throat and sinus problems.  Hematologic/Lymphatic:   Patient denies swollen glands and easy bruising.  Cardiovascular:   Patient denies leg swelling and chest pains.  Respiratory:   Patient denies cough and shortness of breath.  Endocrine:   Patient denies excessive thirst.  Musculoskeletal:   Patient denies back pain and joint pain.  Neurological:   Patient denies headaches and dizziness.  Psychologic:   Patient denies depression and anxiety.   VITAL SIGNS:     Weight 208 lb / 94.35 kg  Height 72 in / 182.88 cm  BMI 28.2 kg/m   MULTI-SYSTEM PHYSICAL EXAMINATION:    Constitutional: Well-nourished. No physical deformities. Normally developed. Good grooming.  Neck: Neck symmetrical, not swollen. Normal tracheal position.  Respiratory: No labored breathing, no use of accessory muscles. Clear bilaterally.  Cardiovascular: Normal temperature, normal extremity  pulses, no swelling, no varicosities. Regular rate and rhythm.  Lymphatic: No enlargement of neck, axillae, groin.  Skin: No paleness, no jaundice, no cyanosis. No lesion, no ulcer, no rash.  Neurologic / Psychiatric: Oriented to time, oriented to place, oriented to person. No depression, no anxiety, no agitation.  Gastrointestinal: No mass, no tenderness, no rigidity, non obese abdomen. He does have an easily reducible umbilical hernia measuring approximately 1-1.5 cm.   Eyes: Normal conjunctivae. Normal eyelids.  Ears, Nose, Mouth, and Throat: Left ear no scars, no lesions, no masses. Right ear no scars, no lesions, no masses. Nose no scars, no lesions, no masses. Normal hearing. Normal lips.  Musculoskeletal: Normal gait and station of head and neck.       ASSESSMENT:      ICD-10 Details  1 GU:   Prostate Cancer - C61   2 NON-GU:   Umbilical hernia without obstruction or gangrene - K42.9    PLAN:         1. Favorable intermediate risk prostate cancer: I had a detailed discussion with [name] today regarding his prostate cancer situation. Based on his life expectancy and disease parameters, I did recommend therapy of curative intent. The patient was counseled about the natural history of prostate cancer and the standard treatment options that are available for prostate cancer. It was explained  to him how his age and life expectancy, clinical stage, Gleason score, and PSA affect his prognosis, the decision to proceed with additional staging studies, as well as how that information influences recommended treatment strategies. We discussed the roles for active surveillance, radiation therapy, surgical therapy, androgen deprivation, as well as ablative therapy options for the treatment of prostate cancer as appropriate to his individual cancer situation. We discussed the risks and benefits of these options with regard to their impact on cancer control and also in terms of potential adverse events,  complications, and impact on quality of life particularly related to urinary and sexual function. The patient was encouraged to ask questions throughout the discussion today and all questions were answered to his stated satisfaction. In addition, the patient was provided with and/or directed to appropriate resources and literature for further education about prostate cancer and treatment options. We discussed surgical therapy for prostate cancer including the different available surgical approaches. We discussed, in detail, the risks and expectations of surgery with regard to cancer control, urinary control, and erectile function as well as the expected postoperative recovery process. Additional risks of surgery including but not limited to bleeding, infection, hernia formation, nerve damage, lymphocele formation, bowel/rectal injury potentially necessitating colostomy, damage to the urinary tract resulting in urine leakage, urethral stricture, and the cardiopulmonary risks such as myocardial infarction, stroke, death, venothromboembolism, etc. were explained. The risk of open surgical conversion for robotic/laparoscopic prostatectomy was also discussed.   Considering his severe lower urinary tract symptoms, he is most interested in proceeding with surgical therapy. He will be scheduled for a bilateral nerve-sparing robot assisted laparoscopic radical prostatectomy and bilateral pelvic lymphadenectomy.   2. Umbilical hernia: He does have an easily reducible umbilical hernia but did ask about having this repaired time of his surgery. We reviewed that in detail and will plan for a primary repair the time of his upcoming procedure.

## 2020-01-10 ENCOUNTER — Other Ambulatory Visit: Payer: Self-pay

## 2020-01-10 ENCOUNTER — Observation Stay (HOSPITAL_COMMUNITY)
Admission: RE | Admit: 2020-01-10 | Discharge: 2020-01-11 | Disposition: A | Discharge status: Home / Self Care | Payer: BC Managed Care – PPO | Source: Ambulatory Visit | Attending: Urology | Admitting: Urology

## 2020-01-10 ENCOUNTER — Ambulatory Visit (HOSPITAL_COMMUNITY): Payer: BC Managed Care – PPO | Admitting: Anesthesiology

## 2020-01-10 ENCOUNTER — Encounter (HOSPITAL_COMMUNITY): Payer: Self-pay | Admitting: Urology

## 2020-01-10 ENCOUNTER — Encounter (HOSPITAL_COMMUNITY)
Admission: RE | Disposition: A | Discharge status: Home / Self Care | Payer: Self-pay | Source: Ambulatory Visit | Attending: Urology

## 2020-01-10 DIAGNOSIS — K219 Gastro-esophageal reflux disease without esophagitis: Secondary | ICD-10-CM | POA: Diagnosis not present

## 2020-01-10 DIAGNOSIS — G4730 Sleep apnea, unspecified: Secondary | ICD-10-CM | POA: Diagnosis not present

## 2020-01-10 DIAGNOSIS — K429 Umbilical hernia without obstruction or gangrene: Secondary | ICD-10-CM | POA: Insufficient documentation

## 2020-01-10 DIAGNOSIS — Z7982 Long term (current) use of aspirin: Secondary | ICD-10-CM | POA: Diagnosis not present

## 2020-01-10 DIAGNOSIS — J449 Chronic obstructive pulmonary disease, unspecified: Secondary | ICD-10-CM | POA: Diagnosis not present

## 2020-01-10 DIAGNOSIS — Z87891 Personal history of nicotine dependence: Secondary | ICD-10-CM | POA: Diagnosis not present

## 2020-01-10 DIAGNOSIS — E785 Hyperlipidemia, unspecified: Secondary | ICD-10-CM | POA: Diagnosis not present

## 2020-01-10 DIAGNOSIS — E669 Obesity, unspecified: Secondary | ICD-10-CM | POA: Diagnosis not present

## 2020-01-10 DIAGNOSIS — Z6828 Body mass index (BMI) 28.0-28.9, adult: Secondary | ICD-10-CM | POA: Diagnosis not present

## 2020-01-10 DIAGNOSIS — Z79899 Other long term (current) drug therapy: Secondary | ICD-10-CM | POA: Diagnosis not present

## 2020-01-10 DIAGNOSIS — N138 Other obstructive and reflux uropathy: Secondary | ICD-10-CM | POA: Insufficient documentation

## 2020-01-10 DIAGNOSIS — C61 Malignant neoplasm of prostate: Principal | ICD-10-CM | POA: Diagnosis present

## 2020-01-10 DIAGNOSIS — N401 Enlarged prostate with lower urinary tract symptoms: Secondary | ICD-10-CM | POA: Diagnosis not present

## 2020-01-10 HISTORY — PX: ROBOT ASSISTED LAPAROSCOPIC RADICAL PROSTATECTOMY: SHX5141

## 2020-01-10 HISTORY — PX: INGUINAL HERNIA REPAIR: SHX194

## 2020-01-10 HISTORY — PX: LYMPHADENECTOMY: SHX5960

## 2020-01-10 LAB — HEMOGLOBIN AND HEMATOCRIT, BLOOD
HCT: 38.8 % — ABNORMAL LOW (ref 39.0–52.0)
Hemoglobin: 12.5 g/dL — ABNORMAL LOW (ref 13.0–17.0)

## 2020-01-10 LAB — TYPE AND SCREEN
ABO/RH(D): AB POS
Antibody Screen: NEGATIVE

## 2020-01-10 SURGERY — XI ROBOTIC ASSISTED LAPAROSCOPIC RADICAL PROSTATECTOMY LEVEL 2
Anesthesia: General

## 2020-01-10 MED ORDER — CHLORHEXIDINE GLUCONATE CLOTH 2 % EX PADS
6.0000 | MEDICATED_PAD | Freq: Every day | CUTANEOUS | Status: DC
  Administered 2020-01-10 – 2020-01-11 (×2): 6 via TOPICAL

## 2020-01-10 MED ORDER — ONDANSETRON HCL 4 MG/2ML IJ SOLN
INTRAMUSCULAR | Status: DC | PRN
  Administered 2020-01-10: 4 mg via INTRAVENOUS

## 2020-01-10 MED ORDER — INDIGOTINDISULFONATE SODIUM 8 MG/ML IJ SOLN
INTRAMUSCULAR | Status: AC
  Filled 2020-01-10: qty 5

## 2020-01-10 MED ORDER — LACTATED RINGERS IV SOLN: INTRAVENOUS | Status: DC

## 2020-01-10 MED ORDER — HEPARIN SODIUM (PORCINE) 1000 UNIT/ML IJ SOLN
INTRAMUSCULAR | Status: AC
  Filled 2020-01-10: qty 1

## 2020-01-10 MED ORDER — MAGNESIUM CITRATE PO SOLN
1.0000 | Freq: Once | ORAL | Status: DC
  Filled 2020-01-10: qty 296

## 2020-01-10 MED ORDER — DIPHENHYDRAMINE HCL 12.5 MG/5ML PO ELIX: 12.5000 mg | ORAL_SOLUTION | Freq: Four times a day (QID) | ORAL | Status: DC | PRN

## 2020-01-10 MED ORDER — BELLADONNA ALKALOIDS-OPIUM 16.2-60 MG RE SUPP
1.0000 | Freq: Four times a day (QID) | RECTAL | Status: DC | PRN
  Administered 2020-01-10: 1 via RECTAL

## 2020-01-10 MED ORDER — ONDANSETRON HCL 4 MG/2ML IJ SOLN: 4.0000 mg | INTRAMUSCULAR | Status: DC | PRN

## 2020-01-10 MED ORDER — ROCURONIUM BROMIDE 100 MG/10ML IV SOLN
INTRAVENOUS | Status: DC | PRN
  Administered 2020-01-10: 10 mg via INTRAVENOUS
  Administered 2020-01-10: 20 mg via INTRAVENOUS
  Administered 2020-01-10: 70 mg via INTRAVENOUS

## 2020-01-10 MED ORDER — LORATADINE 10 MG PO TABS
10.0000 mg | ORAL_TABLET | Freq: Every day | ORAL | Status: DC
  Administered 2020-01-10 – 2020-01-11 (×2): 10 mg via ORAL
  Filled 2020-01-10 (×2): qty 1

## 2020-01-10 MED ORDER — HYDROMORPHONE HCL 1 MG/ML IJ SOLN
INTRAMUSCULAR | Status: AC
  Filled 2020-01-10: qty 1

## 2020-01-10 MED ORDER — STERILE WATER FOR IRRIGATION IR SOLN
Status: DC | PRN
  Administered 2020-01-10: 1000 mL

## 2020-01-10 MED ORDER — FENTANYL CITRATE (PF) 250 MCG/5ML IJ SOLN
INTRAMUSCULAR | Status: AC
  Filled 2020-01-10: qty 5

## 2020-01-10 MED ORDER — KETAMINE HCL 10 MG/ML IJ SOLN
INTRAMUSCULAR | Status: DC | PRN
  Administered 2020-01-10: 40 mg via INTRAVENOUS

## 2020-01-10 MED ORDER — CEFAZOLIN SODIUM-DEXTROSE 2-4 GM/100ML-% IV SOLN
2.0000 g | Freq: Once | INTRAVENOUS | Status: AC
  Administered 2020-01-10: 2 g via INTRAVENOUS
  Filled 2020-01-10: qty 100

## 2020-01-10 MED ORDER — BUPIVACAINE-EPINEPHRINE 0.25% -1:200000 IJ SOLN
INTRAMUSCULAR | Status: DC | PRN
  Administered 2020-01-10: 29 mL

## 2020-01-10 MED ORDER — ACETAMINOPHEN 500 MG PO TABS
1000.0000 mg | ORAL_TABLET | Freq: Once | ORAL | Status: AC
  Administered 2020-01-10: 1000 mg via ORAL
  Filled 2020-01-10: qty 2

## 2020-01-10 MED ORDER — TRAMADOL HCL 50 MG PO TABS: 50.0000 mg | ORAL_TABLET | Freq: Four times a day (QID) | ORAL | 0 refills | Status: DC | PRN

## 2020-01-10 MED ORDER — ZOLPIDEM TARTRATE 5 MG PO TABS: 5.0000 mg | ORAL_TABLET | Freq: Every evening | ORAL | Status: DC | PRN

## 2020-01-10 MED ORDER — FENTANYL CITRATE (PF) 250 MCG/5ML IJ SOLN
INTRAMUSCULAR | Status: DC | PRN
  Administered 2020-01-10: 50 ug via INTRAVENOUS
  Administered 2020-01-10: 25 ug via INTRAVENOUS
  Administered 2020-01-10: 100 ug via INTRAVENOUS

## 2020-01-10 MED ORDER — BELLADONNA ALKALOIDS-OPIUM 16.2-60 MG RE SUPP
RECTAL | Status: AC
  Filled 2020-01-10: qty 1

## 2020-01-10 MED ORDER — ORAL CARE MOUTH RINSE: 15.0000 mL | Freq: Once | OROMUCOSAL | Status: AC

## 2020-01-10 MED ORDER — KCL IN DEXTROSE-NACL 20-5-0.45 MEQ/L-%-% IV SOLN
INTRAVENOUS | Status: DC
  Filled 2020-01-10 (×4): qty 1000

## 2020-01-10 MED ORDER — INDIGOTINDISULFONATE SODIUM 8 MG/ML IJ SOLN
INTRAMUSCULAR | Status: DC | PRN
Start: 2020-01-10 — End: 2020-01-10
  Administered 2020-01-10: 5 mL via INTRAVENOUS

## 2020-01-10 MED ORDER — SCOPOLAMINE 1 MG/3DAYS TD PT72
1.0000 | MEDICATED_PATCH | Freq: Once | TRANSDERMAL | Status: DC
  Administered 2020-01-10: 1.5 mg via TRANSDERMAL
  Filled 2020-01-10: qty 1

## 2020-01-10 MED ORDER — SODIUM CHLORIDE 0.9 % IR SOLN
Status: DC | PRN
  Administered 2020-01-10: 1000 mL

## 2020-01-10 MED ORDER — PHENYLEPHRINE 40 MCG/ML (10ML) SYRINGE FOR IV PUSH (FOR BLOOD PRESSURE SUPPORT)
PREFILLED_SYRINGE | INTRAVENOUS | Status: DC | PRN
  Administered 2020-01-10 (×2): 80 ug via INTRAVENOUS
  Administered 2020-01-10: 40 ug via INTRAVENOUS

## 2020-01-10 MED ORDER — EPHEDRINE SULFATE-NACL 50-0.9 MG/10ML-% IV SOSY
PREFILLED_SYRINGE | INTRAVENOUS | Status: DC | PRN
  Administered 2020-01-10 (×3): 10 mg via INTRAVENOUS
  Administered 2020-01-10: 5 mg via INTRAVENOUS
  Administered 2020-01-10: 10 mg via INTRAVENOUS

## 2020-01-10 MED ORDER — MIDAZOLAM HCL 2 MG/2ML IJ SOLN: 0.5000 mg | Freq: Once | INTRAMUSCULAR | Status: DC | PRN

## 2020-01-10 MED ORDER — PROPOFOL 10 MG/ML IV BOLUS
INTRAVENOUS | Status: AC
  Filled 2020-01-10: qty 20

## 2020-01-10 MED ORDER — LIDOCAINE 20MG/ML (2%) 15 ML SYRINGE OPTIME
INTRAMUSCULAR | Status: DC | PRN
Start: 2020-01-10 — End: 2020-01-10
  Administered 2020-01-10: 1.5 mg/kg/h via INTRAVENOUS

## 2020-01-10 MED ORDER — MORPHINE SULFATE (PF) 2 MG/ML IV SOLN: 2.0000 mg | INTRAVENOUS | Status: DC | PRN

## 2020-01-10 MED ORDER — MIDAZOLAM HCL 2 MG/2ML IJ SOLN
INTRAMUSCULAR | Status: AC
  Filled 2020-01-10: qty 2

## 2020-01-10 MED ORDER — BUPIVACAINE HCL 0.25 % IJ SOLN
INTRAMUSCULAR | Status: AC
  Filled 2020-01-10: qty 1

## 2020-01-10 MED ORDER — SUGAMMADEX SODIUM 200 MG/2ML IV SOLN
INTRAVENOUS | Status: DC | PRN
  Administered 2020-01-10: 200 mg via INTRAVENOUS

## 2020-01-10 MED ORDER — MEPERIDINE HCL 50 MG/ML IJ SOLN: 6.2500 mg | INTRAMUSCULAR | Status: DC | PRN

## 2020-01-10 MED ORDER — SODIUM CHLORIDE 0.9 % IV BOLUS
1000.0000 mL | Freq: Once | INTRAVENOUS | Status: AC
  Administered 2020-01-10: 1000 mL via INTRAVENOUS

## 2020-01-10 MED ORDER — MONTELUKAST SODIUM 10 MG PO TABS
10.0000 mg | ORAL_TABLET | Freq: Every day | ORAL | Status: DC
  Administered 2020-01-10: 10 mg via ORAL
  Filled 2020-01-10: qty 1

## 2020-01-10 MED ORDER — PHENYLEPHRINE 40 MCG/ML (10ML) SYRINGE FOR IV PUSH (FOR BLOOD PRESSURE SUPPORT)
PREFILLED_SYRINGE | INTRAVENOUS | Status: AC
  Filled 2020-01-10: qty 10

## 2020-01-10 MED ORDER — PHENTERMINE HCL 37.5 MG PO CAPS: 37.5000 mg | ORAL_CAPSULE | Freq: Every day | ORAL | Status: DC

## 2020-01-10 MED ORDER — CHLORHEXIDINE GLUCONATE 0.12 % MT SOLN
15.0000 mL | Freq: Once | OROMUCOSAL | Status: AC
  Administered 2020-01-10: 15 mL via OROMUCOSAL

## 2020-01-10 MED ORDER — FLEET ENEMA 7-19 GM/118ML RE ENEM
1.0000 | ENEMA | Freq: Once | RECTAL | Status: DC
  Filled 2020-01-10: qty 1

## 2020-01-10 MED ORDER — DOCUSATE SODIUM 100 MG PO CAPS
100.0000 mg | ORAL_CAPSULE | Freq: Two times a day (BID) | ORAL | Status: DC
  Administered 2020-01-10 – 2020-01-11 (×2): 100 mg via ORAL
  Filled 2020-01-10 (×2): qty 1

## 2020-01-10 MED ORDER — HYDROMORPHONE HCL 1 MG/ML IJ SOLN
0.2500 mg | INTRAMUSCULAR | Status: DC | PRN
  Administered 2020-01-10 (×2): 0.5 mg via INTRAVENOUS

## 2020-01-10 MED ORDER — DIPHENHYDRAMINE HCL 50 MG/ML IJ SOLN: 12.5000 mg | Freq: Four times a day (QID) | INTRAMUSCULAR | Status: DC | PRN

## 2020-01-10 MED ORDER — CEFAZOLIN SODIUM-DEXTROSE 1-4 GM/50ML-% IV SOLN
1.0000 g | Freq: Three times a day (TID) | INTRAVENOUS | Status: AC
  Administered 2020-01-10 (×2): 1 g via INTRAVENOUS
  Filled 2020-01-10 (×2): qty 50

## 2020-01-10 MED ORDER — LACTATED RINGERS IV SOLN: INTRAVENOUS | Status: DC | PRN

## 2020-01-10 MED ORDER — PROPOFOL 10 MG/ML IV BOLUS
INTRAVENOUS | Status: DC | PRN
  Administered 2020-01-10: 200 mg via INTRAVENOUS

## 2020-01-10 MED ORDER — PROMETHAZINE HCL 25 MG/ML IJ SOLN: 6.2500 mg | INTRAMUSCULAR | Status: DC | PRN

## 2020-01-10 MED ORDER — ONDANSETRON HCL 4 MG/2ML IJ SOLN
INTRAMUSCULAR | Status: AC
  Filled 2020-01-10: qty 2

## 2020-01-10 MED ORDER — BACITRACIN-NEOMYCIN-POLYMYXIN 400-5-5000 EX OINT: 1.0000 "application " | TOPICAL_OINTMENT | Freq: Three times a day (TID) | CUTANEOUS | Status: DC | PRN

## 2020-01-10 MED ORDER — DEXAMETHASONE SODIUM PHOSPHATE 10 MG/ML IJ SOLN
INTRAMUSCULAR | Status: DC | PRN
  Administered 2020-01-10: 10 mg via INTRAVENOUS

## 2020-01-10 MED ORDER — MIDAZOLAM HCL 5 MG/5ML IJ SOLN
INTRAMUSCULAR | Status: DC | PRN
  Administered 2020-01-10: 2 mg via INTRAVENOUS

## 2020-01-10 MED ORDER — KETAMINE HCL 10 MG/ML IJ SOLN
INTRAMUSCULAR | Status: AC
  Filled 2020-01-10: qty 1

## 2020-01-10 MED ORDER — FLUTICASONE PROPIONATE 50 MCG/ACT NA SUSP: 2.0000 | Freq: Every day | NASAL | Status: DC

## 2020-01-10 MED ORDER — EPHEDRINE 5 MG/ML INJ
INTRAVENOUS | Status: AC
  Filled 2020-01-10: qty 10

## 2020-01-10 MED ORDER — LIDOCAINE HCL (CARDIAC) PF 100 MG/5ML IV SOSY
PREFILLED_SYRINGE | INTRAVENOUS | Status: DC | PRN
  Administered 2020-01-10: 30 mg via INTRAVENOUS

## 2020-01-10 MED ORDER — KETOROLAC TROMETHAMINE 15 MG/ML IJ SOLN
15.0000 mg | Freq: Four times a day (QID) | INTRAMUSCULAR | Status: DC
  Administered 2020-01-10 – 2020-01-11 (×4): 15 mg via INTRAVENOUS
  Filled 2020-01-10 (×4): qty 1

## 2020-01-10 MED ORDER — SULFAMETHOXAZOLE-TRIMETHOPRIM 800-160 MG PO TABS
1.0000 | ORAL_TABLET | Freq: Two times a day (BID) | ORAL | 0 refills | Status: DC
Start: 2020-01-10 — End: 2020-02-22

## 2020-01-10 MED ORDER — LIDOCAINE HCL 2 % IJ SOLN
INTRAMUSCULAR | Status: AC
  Filled 2020-01-10: qty 20

## 2020-01-10 MED ORDER — ACETAMINOPHEN 325 MG PO TABS: 650.0000 mg | ORAL_TABLET | ORAL | Status: DC | PRN

## 2020-01-10 SURGICAL SUPPLY — 64 items
ADH SKN CLS APL DERMABOND .7 (GAUZE/BANDAGES/DRESSINGS) ×2
APL PRP STRL LF DISP 70% ISPRP (MISCELLANEOUS) ×2
APL SWBSTK 6 STRL LF DISP (MISCELLANEOUS) ×2
APPLICATOR COTTON TIP 6 STRL (MISCELLANEOUS) ×2 IMPLANT
APPLICATOR COTTON TIP 6IN STRL (MISCELLANEOUS) ×3
CATH FOLEY 2WAY SLVR 18FR 30CC (CATHETERS) ×3 IMPLANT
CATH ROBINSON RED A/P 16FR (CATHETERS) ×3 IMPLANT
CATH ROBINSON RED A/P 8FR (CATHETERS) ×3 IMPLANT
CATH TIEMANN FOLEY 18FR 5CC (CATHETERS) ×3 IMPLANT
CHLORAPREP W/TINT 26 (MISCELLANEOUS) ×3 IMPLANT
CLIP VESOLOCK LG 6/CT PURPLE (CLIP) ×6 IMPLANT
COVER SURGICAL LIGHT HANDLE (MISCELLANEOUS) ×3 IMPLANT
COVER TIP SHEARS 8 DVNC (MISCELLANEOUS) ×2 IMPLANT
COVER TIP SHEARS 8MM DA VINCI (MISCELLANEOUS) ×3
COVER WAND RF STERILE (DRAPES) IMPLANT
CUTTER ECHEON FLEX ENDO 45 340 (ENDOMECHANICALS) ×3 IMPLANT
DECANTER SPIKE VIAL GLASS SM (MISCELLANEOUS) ×3 IMPLANT
DERMABOND ADVANCED (GAUZE/BANDAGES/DRESSINGS) ×1
DERMABOND ADVANCED .7 DNX12 (GAUZE/BANDAGES/DRESSINGS) ×2 IMPLANT
DRAIN CHANNEL RND F F (WOUND CARE) ×3 IMPLANT
DRAPE ARM DVNC X/XI (DISPOSABLE) ×8 IMPLANT
DRAPE COLUMN DVNC XI (DISPOSABLE) ×2 IMPLANT
DRAPE DA VINCI XI ARM (DISPOSABLE) ×12
DRAPE DA VINCI XI COLUMN (DISPOSABLE) ×3
DRAPE SURG IRRIG POUCH 19X23 (DRAPES) ×3 IMPLANT
DRSG TEGADERM 4X4.75 (GAUZE/BANDAGES/DRESSINGS) ×4 IMPLANT
ELECT REM PT RETURN 15FT ADLT (MISCELLANEOUS) ×3 IMPLANT
GLOVE BIO SURGEON STRL SZ 6.5 (GLOVE) ×3 IMPLANT
GLOVE BIOGEL M STRL SZ7.5 (GLOVE) ×6 IMPLANT
GOWN STRL REUS W/TWL LRG LVL3 (GOWN DISPOSABLE) ×10 IMPLANT
HEMOSTAT SURGICEL 2X3 (HEMOSTASIS) ×1 IMPLANT
HOLDER FOLEY CATH W/STRAP (MISCELLANEOUS) ×3 IMPLANT
IRRIG SUCT STRYKERFLOW 2 WTIP (MISCELLANEOUS) ×3
IRRIGATION SUCT STRKRFLW 2 WTP (MISCELLANEOUS) ×2 IMPLANT
IV LACTATED RINGERS 1000ML (IV SOLUTION) ×3 IMPLANT
KIT TURNOVER KIT A (KITS) IMPLANT
NDL SAFETY ECLIPSE 18X1.5 (NEEDLE) ×2 IMPLANT
NEEDLE HYPO 18GX1.5 SHARP (NEEDLE) ×3
PACK ROBOTIC CUSTOM UROLOGY (CUSTOM PROCEDURE TRAY) ×3 IMPLANT
PENCIL SMOKE EVACUATOR (MISCELLANEOUS) IMPLANT
RELOAD STAPLE 45 4.1 GRN THCK (STAPLE) ×2 IMPLANT
SEAL CANN UNIV 5-8 DVNC XI (MISCELLANEOUS) ×8 IMPLANT
SEAL XI 5MM-8MM UNIVERSAL (MISCELLANEOUS) ×12
SET IRRIG Y TYPE TUR BLADDER L (SET/KITS/TRAYS/PACK) ×3 IMPLANT
SET TUBE SMOKE EVAC HIGH FLOW (TUBING) ×3 IMPLANT
SOLUTION ELECTROLUBE (MISCELLANEOUS) ×3 IMPLANT
STAPLE RELOAD 45 GRN (STAPLE) ×2 IMPLANT
STAPLE RELOAD 45MM GREEN (STAPLE) ×3
SUT ETHILON 3 0 PS 1 (SUTURE) ×3 IMPLANT
SUT MNCRL 3 0 RB1 (SUTURE) ×2 IMPLANT
SUT MNCRL 3 0 VIOLET RB1 (SUTURE) ×2 IMPLANT
SUT MNCRL AB 4-0 PS2 18 (SUTURE) ×6 IMPLANT
SUT MONOCRYL 3 0 RB1 (SUTURE) ×6
SUT NOVA 0 T19/GS 22DT (SUTURE) ×2 IMPLANT
SUT VIC AB 0 CT1 27 (SUTURE) ×3
SUT VIC AB 0 CT1 27XBRD ANTBC (SUTURE) ×2 IMPLANT
SUT VIC AB 0 UR5 27 (SUTURE) ×3 IMPLANT
SUT VIC AB 2-0 SH 27 (SUTURE) ×3
SUT VIC AB 2-0 SH 27X BRD (SUTURE) ×2 IMPLANT
SUT VICRYL 0 UR6 27IN ABS (SUTURE) ×6 IMPLANT
SYR 27GX1/2 1ML LL SAFETY (SYRINGE) ×3 IMPLANT
TOWEL OR NON WOVEN STRL DISP B (DISPOSABLE) ×3 IMPLANT
TROCAR XCEL NON-BLD 5MMX100MML (ENDOMECHANICALS) IMPLANT
WATER STERILE IRR 1000ML POUR (IV SOLUTION) ×3 IMPLANT

## 2020-01-10 NOTE — Op Note (Signed)

## 2020-01-10 NOTE — Plan of Care (Signed)
Patient education guide given. Patient call bell is within reach. Pt will call for assistance when needing to get out of bed. Bed alarm set.

## 2020-01-10 NOTE — Transfer of Care (Signed)
Immediate Anesthesia Transfer of Care Note  Patient: George Garner  Procedure(s) Performed: XI ROBOTIC ASSISTED LAPAROSCOPIC RADICAL PROSTATECTOMY LEVEL 2 (N/A ) LYMPHADENECTOMY, PELVIC (Bilateral ) HERNIA REPAIR UMBILICAL, ADULT (N/A )  Patient Location: PACU  Anesthesia Type:General  Level of Consciousness: drowsy, patient cooperative and responds to stimulation  Airway & Oxygen Therapy: Patient Spontanous Breathing and Patient connected to face mask oxygen  Post-op Assessment: Report given to RN and Post -op Vital signs reviewed and stable  Post vital signs: Reviewed and stable  Last Vitals:  Vitals Value Taken Time  BP    Temp    Pulse 92 01/10/20 1041  Resp    SpO2 100 % 01/10/20 1041  Vitals shown include unvalidated device data.  Last Pain:  Vitals:   01/10/20 0608  TempSrc: Oral         Complications: No complications documented.

## 2020-01-10 NOTE — Discharge Instructions (Signed)

## 2020-01-10 NOTE — Anesthesia Procedure Notes (Signed)
Procedure Name: Intubation Date/Time: 01/10/2020 7:33 AM Performed by: Glory Buff, CRNA Pre-anesthesia Checklist: Patient identified, Emergency Drugs available, Suction available and Patient being monitored Patient Re-evaluated:Patient Re-evaluated prior to induction Oxygen Delivery Method: Circle system utilized Preoxygenation: Pre-oxygenation with 100% oxygen Induction Type: IV induction Ventilation: Mask ventilation without difficulty, Oral airway inserted - appropriate to patient size and Two handed mask ventilation required Tube type: Oral Tube size: 7.5 mm Number of attempts: 1 Airway Equipment and Method: Stylet and Oral airway Placement Confirmation: ETT inserted through vocal cords under direct vision,  positive ETCO2 and breath sounds checked- equal and bilateral Secured at: 22 cm Tube secured with: Tape Dental Injury: Teeth and Oropharynx as per pre-operative assessment

## 2020-01-10 NOTE — Progress Notes (Signed)
Patient ambulated 90 feet. Activity tolerated well. Will continue to encouraged activity and monitor.

## 2020-01-10 NOTE — Progress Notes (Signed)
Patient ID: George Garner, male   DOB: Apr 30, 1956, 64 y.o.   MRN: [id number] Post-op note  Subjective: The patient is doing well.  No complaints except some bladder spasms.  Denies N/V.   Objective: Vital signs in last 24 hours: Temp:  [97.6 F (36.4 C)-98.6 F (37 C)] 98.5 F (36.9 C) (06/10 1330) Pulse Rate:  [69-92] 80 (06/10 1330) Resp:  [9-18] 14 (06/10 1330) BP: (112-136)/(61-77) 121/61 (06/10 1330) SpO2:  [91 %-100 %] 96 % (06/10 1330)  Intake/Output from previous day: No intake/output data recorded. Intake/Output this shift: Total I/O In: 3620 [I.V.:2520; IV Piggyback:1100] Out: 280 [Urine:150; Drains:80; Blood:50]  Physical Exam:  General: Alert and oriented. Abdomen: Soft, Nondistended. Incisions: Clean and dry. Urine: red  Lab Results: Recent Labs    01/10/20 1046  HGB 12.5*  HCT 38.8*    Assessment/Plan: POD#0   1) Continue to monitor  2) DVT prophy, clears, IS, amb, pain control  3) B/O prn for spasms   LOS: 0 days   Debbrah Alar 01/10/2020, 2:08 PM

## 2020-01-10 NOTE — Anesthesia Postprocedure Evaluation (Signed)
Anesthesia Post Note  Patient: George Garner  Procedure(s) Performed: XI ROBOTIC ASSISTED LAPAROSCOPIC RADICAL PROSTATECTOMY LEVEL 2 (N/A ) LYMPHADENECTOMY, PELVIC (Bilateral ) HERNIA REPAIR UMBILICAL, ADULT (N/A )     Patient location during evaluation: PACU Anesthesia Type: General Level of consciousness: awake and alert, oriented and patient cooperative Pain management: pain level controlled Vital Signs Assessment: post-procedure vital signs reviewed and stable Respiratory status: spontaneous breathing, nonlabored ventilation and respiratory function stable Cardiovascular status: blood pressure returned to baseline and stable Postop Assessment: no apparent nausea or vomiting Anesthetic complications: no   No complications documented.  Last Vitals:  Vitals:   01/10/20 1600 01/10/20 2037  BP: 118/69 (!) 118/58  Pulse: 82 66  Resp: 16 20  Temp:  36.7 C  SpO2: 97% 99%    Last Pain:  Vitals:   01/10/20 2037  TempSrc: Oral  PainSc:                  Seleta Rhymes. Texanna Hilburn

## 2020-01-11 ENCOUNTER — Encounter (HOSPITAL_COMMUNITY): Payer: Self-pay | Admitting: Urology

## 2020-01-11 DIAGNOSIS — E669 Obesity, unspecified: Secondary | ICD-10-CM | POA: Diagnosis not present

## 2020-01-11 DIAGNOSIS — N401 Enlarged prostate with lower urinary tract symptoms: Secondary | ICD-10-CM | POA: Diagnosis not present

## 2020-01-11 DIAGNOSIS — Z7982 Long term (current) use of aspirin: Secondary | ICD-10-CM | POA: Diagnosis not present

## 2020-01-11 DIAGNOSIS — Z79899 Other long term (current) drug therapy: Secondary | ICD-10-CM | POA: Diagnosis not present

## 2020-01-11 DIAGNOSIS — Z6828 Body mass index (BMI) 28.0-28.9, adult: Secondary | ICD-10-CM | POA: Diagnosis not present

## 2020-01-11 DIAGNOSIS — G4730 Sleep apnea, unspecified: Secondary | ICD-10-CM | POA: Diagnosis not present

## 2020-01-11 DIAGNOSIS — C61 Malignant neoplasm of prostate: Secondary | ICD-10-CM | POA: Diagnosis not present

## 2020-01-11 DIAGNOSIS — K429 Umbilical hernia without obstruction or gangrene: Secondary | ICD-10-CM | POA: Diagnosis not present

## 2020-01-11 DIAGNOSIS — N138 Other obstructive and reflux uropathy: Secondary | ICD-10-CM | POA: Diagnosis not present

## 2020-01-11 DIAGNOSIS — Z87891 Personal history of nicotine dependence: Secondary | ICD-10-CM | POA: Diagnosis not present

## 2020-01-11 DIAGNOSIS — J449 Chronic obstructive pulmonary disease, unspecified: Secondary | ICD-10-CM | POA: Diagnosis not present

## 2020-01-11 LAB — HEMOGLOBIN AND HEMATOCRIT, BLOOD
HCT: 33.7 % — ABNORMAL LOW (ref 39.0–52.0)
HCT: 36.5 % — ABNORMAL LOW (ref 39.0–52.0)
Hemoglobin: 10.7 g/dL — ABNORMAL LOW (ref 13.0–17.0)
Hemoglobin: 11.8 g/dL — ABNORMAL LOW (ref 13.0–17.0)

## 2020-01-11 MED ORDER — TRAMADOL HCL 50 MG PO TABS: 50.0000 mg | ORAL_TABLET | Freq: Four times a day (QID) | ORAL | Status: DC | PRN

## 2020-01-11 MED ORDER — BISACODYL 10 MG RE SUPP
10.0000 mg | Freq: Once | RECTAL | Status: AC
  Administered 2020-01-11: 10 mg via RECTAL
  Filled 2020-01-11: qty 1

## 2020-01-11 NOTE — Progress Notes (Signed)
Patients JP drain removed - WNL.  Reviewed foley care and management at home with patient and his wife.  Wife assisting and able to change to leg bag without difficulty.  Verbalizes understanding on foley care and importance of keeping catheter clean/handwashing to prevent infection.  Encouraged patient to walk frequently and to take in plenty of fluids at home, as well as continue use of IS.

## 2020-01-11 NOTE — Discharge Summary (Signed)
  Date of admission: 01/10/2020  Date of discharge: 01/11/2020  Admission diagnosis: Prostate Cancer  Discharge diagnosis: Prostate Cancer  History and Physical: For full details, please see admission history and physical. Briefly, George Garner is a 64 y.o. gentleman with localized prostate cancer.  After discussing management/treatment options, he elected to proceed with surgical treatment.  Hospital Course: AURTHUR WINGERTER was taken to the operating room on 01/10/2020 and underwent a robotic assisted laparoscopic radical prostatectomy. He tolerated this procedure well and without complications. Postoperatively, he was able to be transferred to a regular hospital room following recovery from anesthesia.  He was able to begin ambulating the night of surgery. He remained hemodynamically stable overnight.  He had excellent urine output with appropriately minimal output from his pelvic drain and his pelvic drain was removed on POD #1.  He was transitioned to oral pain medication, tolerated a clear liquid diet, and had met all discharge criteria and was able to be discharged home later on POD#1.  Laboratory values:  Recent Labs    01/10/20 1046 01/11/20 0519 01/11/20 1052  HGB 12.5* 10.7* 11.8*  HCT 38.8* 33.7* 36.5*    Disposition: Home  Discharge instruction: He was instructed to be ambulatory but to refrain from heavy lifting, strenuous activity, or driving. He was instructed on urethral catheter care.  Discharge medications:   Allergies as of 01/11/2020      Reactions   Penicillins Rash   Teen Years      Medication List    STOP taking these medications   aspirin 81 MG tablet   multivitamin tablet   SAW PALMETTO PO   tamsulosin 0.4 MG Caps capsule Commonly known as: FLOMAX   Tylenol PM Extra Strength 25-500 MG Tabs tablet Generic drug: diphenhydramine-acetaminophen     TAKE these medications   acetaminophen 500 MG tablet Commonly known as: TYLENOL Take 500 mg by mouth every 6  (six) hours as needed for mild pain or headache.   cetirizine 10 MG tablet Commonly known as: ZYRTEC Take 1 tablet (10 mg total) by mouth daily. What changed: when to take this   fluticasone 50 MCG/ACT nasal spray Commonly known as: FLONASE Place 2 sprays into both nostrils daily.   montelukast 10 MG tablet Commonly known as: SINGULAIR TAKE 1 TABLET BY MOUTH EVERYDAY AT BEDTIME What changed:   how much to take  how to take this  when to take this  additional instructions   phentermine 37.5 MG capsule Take 37.5 mg by mouth daily.   sulfamethoxazole-trimethoprim 800-160 MG tablet Commonly known as: BACTRIM DS Take 1 tablet by mouth 2 (two) times daily. Start the day prior to foley removal appointment   traMADol 50 MG tablet Commonly known as: Ultram Take 1-2 tablets (50-100 mg total) by mouth every 6 (six) hours as needed for moderate pain or severe pain.       Followup: He will followup in 1 week for catheter removal and to discuss his surgical pathology results.

## 2020-01-11 NOTE — Progress Notes (Signed)
Patient ID: George Garner, male   DOB: 11-May-1956, 64 y.o.   MRN: [id number]  1 Day Post-Op Subjective: The patient is doing well.  No nausea or vomiting. Pain is adequately controlled.  Objective: Vital signs in last 24 hours: Temp:  [97.6 F (36.4 C)-98.5 F (36.9 C)] 98.4 F (36.9 C) (06/11 0432) Pulse Rate:  [61-92] 61 (06/11 0432) Resp:  [9-20] 16 (06/11 0432) BP: (102-128)/(51-72) 127/69 (06/11 0432) SpO2:  [91 %-100 %] 93 % (06/11 0432) Weight:  [96.6 kg] 96.6 kg (06/10 1719)  Intake/Output from previous day: 06/10 0701 - 06/11 0700 In: 4127.3 [P.O.:120; I.V.:2837.3; IV Piggyback:1150] Out: 2525 [Urine:2275; Drains:200; Blood:50] Intake/Output this shift: Total I/O In: -  Out: 350 [Urine:300; Drains:50]  Physical Exam:  General: Alert and oriented. CV: RRR Lungs: Clear bilaterally. GI: Soft, Nondistended. Incisions: Clean, dry, and intact Urine: Clear Extremities: Nontender, no erythema, no edema.  Lab Results: Recent Labs    01/10/20 1046 01/11/20 0519  HGB 12.5* 10.7*  HCT 38.8* 33.7*      Assessment/Plan: POD# 1 s/p robotic prostatectomy.  1) SL IVF 2) Ambulate, Incentive spirometry 3) Transition to oral pain medication 4) Dulcolax suppository 5) D/C pelvic drain 6) Plan for likely discharge later today   Pryor Curia. MD   LOS: 0 days   Dutch Gray 01/11/2020, 9:39 AM

## 2020-01-15 LAB — SURGICAL PATHOLOGY

## 2020-01-17 DIAGNOSIS — M9903 Segmental and somatic dysfunction of lumbar region: Secondary | ICD-10-CM | POA: Diagnosis not present

## 2020-01-17 DIAGNOSIS — M9905 Segmental and somatic dysfunction of pelvic region: Secondary | ICD-10-CM | POA: Diagnosis not present

## 2020-01-17 DIAGNOSIS — M25551 Pain in right hip: Secondary | ICD-10-CM | POA: Diagnosis not present

## 2020-01-17 DIAGNOSIS — M9906 Segmental and somatic dysfunction of lower extremity: Secondary | ICD-10-CM | POA: Diagnosis not present

## 2020-01-23 DIAGNOSIS — M9907 Segmental and somatic dysfunction of upper extremity: Secondary | ICD-10-CM | POA: Diagnosis not present

## 2020-01-23 DIAGNOSIS — M9901 Segmental and somatic dysfunction of cervical region: Secondary | ICD-10-CM | POA: Diagnosis not present

## 2020-01-23 DIAGNOSIS — M5412 Radiculopathy, cervical region: Secondary | ICD-10-CM | POA: Diagnosis not present

## 2020-01-23 DIAGNOSIS — M9902 Segmental and somatic dysfunction of thoracic region: Secondary | ICD-10-CM | POA: Diagnosis not present

## 2020-01-25 DIAGNOSIS — M9907 Segmental and somatic dysfunction of upper extremity: Secondary | ICD-10-CM | POA: Diagnosis not present

## 2020-01-25 DIAGNOSIS — M9902 Segmental and somatic dysfunction of thoracic region: Secondary | ICD-10-CM | POA: Diagnosis not present

## 2020-01-25 DIAGNOSIS — M5412 Radiculopathy, cervical region: Secondary | ICD-10-CM | POA: Diagnosis not present

## 2020-01-25 DIAGNOSIS — M9901 Segmental and somatic dysfunction of cervical region: Secondary | ICD-10-CM | POA: Diagnosis not present

## 2020-01-28 DIAGNOSIS — M9901 Segmental and somatic dysfunction of cervical region: Secondary | ICD-10-CM | POA: Diagnosis not present

## 2020-01-28 DIAGNOSIS — M9907 Segmental and somatic dysfunction of upper extremity: Secondary | ICD-10-CM | POA: Diagnosis not present

## 2020-01-28 DIAGNOSIS — M5412 Radiculopathy, cervical region: Secondary | ICD-10-CM | POA: Diagnosis not present

## 2020-01-28 DIAGNOSIS — M9902 Segmental and somatic dysfunction of thoracic region: Secondary | ICD-10-CM | POA: Diagnosis not present

## 2020-01-29 DIAGNOSIS — M5412 Radiculopathy, cervical region: Secondary | ICD-10-CM | POA: Diagnosis not present

## 2020-01-29 DIAGNOSIS — M9901 Segmental and somatic dysfunction of cervical region: Secondary | ICD-10-CM | POA: Diagnosis not present

## 2020-01-29 DIAGNOSIS — M9902 Segmental and somatic dysfunction of thoracic region: Secondary | ICD-10-CM | POA: Diagnosis not present

## 2020-01-29 DIAGNOSIS — M9907 Segmental and somatic dysfunction of upper extremity: Secondary | ICD-10-CM | POA: Diagnosis not present

## 2020-01-31 DIAGNOSIS — M5412 Radiculopathy, cervical region: Secondary | ICD-10-CM | POA: Diagnosis not present

## 2020-01-31 DIAGNOSIS — M9907 Segmental and somatic dysfunction of upper extremity: Secondary | ICD-10-CM | POA: Diagnosis not present

## 2020-01-31 DIAGNOSIS — M9901 Segmental and somatic dysfunction of cervical region: Secondary | ICD-10-CM | POA: Diagnosis not present

## 2020-01-31 DIAGNOSIS — M9902 Segmental and somatic dysfunction of thoracic region: Secondary | ICD-10-CM | POA: Diagnosis not present

## 2020-02-07 DIAGNOSIS — M9901 Segmental and somatic dysfunction of cervical region: Secondary | ICD-10-CM | POA: Diagnosis not present

## 2020-02-07 DIAGNOSIS — M9902 Segmental and somatic dysfunction of thoracic region: Secondary | ICD-10-CM | POA: Diagnosis not present

## 2020-02-07 DIAGNOSIS — M5412 Radiculopathy, cervical region: Secondary | ICD-10-CM | POA: Diagnosis not present

## 2020-02-07 DIAGNOSIS — M9907 Segmental and somatic dysfunction of upper extremity: Secondary | ICD-10-CM | POA: Diagnosis not present

## 2020-02-08 DIAGNOSIS — N393 Stress incontinence (female) (male): Secondary | ICD-10-CM | POA: Diagnosis not present

## 2020-02-08 DIAGNOSIS — M6281 Muscle weakness (generalized): Secondary | ICD-10-CM | POA: Diagnosis not present

## 2020-02-08 DIAGNOSIS — M62838 Other muscle spasm: Secondary | ICD-10-CM | POA: Diagnosis not present

## 2020-02-14 ENCOUNTER — Encounter: Payer: Self-pay | Admitting: Family Medicine

## 2020-02-22 ENCOUNTER — Encounter: Payer: Self-pay | Admitting: Family Medicine

## 2020-02-22 ENCOUNTER — Other Ambulatory Visit: Payer: Self-pay

## 2020-02-22 ENCOUNTER — Ambulatory Visit (INDEPENDENT_AMBULATORY_CARE_PROVIDER_SITE_OTHER): Payer: BC Managed Care – PPO | Admitting: Family Medicine

## 2020-02-22 VITALS — BP 136/78 | HR 78 | Temp 98.0°F | Ht 72.0 in | Wt 210.0 lb

## 2020-02-22 DIAGNOSIS — G4733 Obstructive sleep apnea (adult) (pediatric): Secondary | ICD-10-CM | POA: Diagnosis not present

## 2020-02-22 DIAGNOSIS — J309 Allergic rhinitis, unspecified: Secondary | ICD-10-CM | POA: Diagnosis not present

## 2020-02-22 DIAGNOSIS — C61 Malignant neoplasm of prostate: Secondary | ICD-10-CM

## 2020-02-22 DIAGNOSIS — E785 Hyperlipidemia, unspecified: Secondary | ICD-10-CM

## 2020-02-22 DIAGNOSIS — Z9989 Dependence on other enabling machines and devices: Secondary | ICD-10-CM

## 2020-02-22 NOTE — Progress Notes (Signed)
Subjective:  Patient ID: George Garner, male    DOB: 1955-12-23  Age: 64 y.o. MRN: [id number]  CC:  Chief Complaint  Patient presents with  . Hospitalization Follow-up    on 01/10/2020 pt had his prostate removed. pt reports since the surgery he has felt great with no complantes.   . Follow-up    on hyperlipidemia.     HPI George Garner presents for   Prostate cancer Urologist Dr. Alinda Money. Managing 10 through June 11 for robotic assisted laparoscopic radical prostatectomy.  Localized prostate cancer. Doing ok - recovered well, urinating normally.   Hyperlipidemia: Not currently on meds, diet/exercise approach attempted.  Weight down 3 pounds from June, 13 pounds down from January 27 visit.  Low carb diet.  Some decreased exercise, but plan on increase now with less restrictions postop.  Not fasting today.  Wt Readings from Last 3 Encounters:  02/22/20 (!) 210 lb (95.3 kg)  01/10/20 213 lb 0.8 oz (96.6 kg)  01/02/20 213 lb 1 oz (96.6 kg)   Lab Results  Component Value Date   CHOL 170 08/10/2019   HDL 42 08/10/2019   LDLCALC 113 (H) 08/10/2019   TRIG 77 08/10/2019   CHOLHDL 4.0 08/10/2019   Lab Results  Component Value Date   ALT 25 08/10/2019   AST 18 08/10/2019   ALKPHOS 63 08/10/2019   BILITOT 0.4 08/10/2019   Obstructive sleep apnea with history of retrognathia, upper airway resistance. Has used CPAP.  Message noted from March with concern about mask seal. Has not talked to sleep specialist.   Allergic rhinitis: Treated with Flonase, Zyrtec, Singulair. Doing well with daily use, occasional missed dose flonase.   Covid 19 vaccine in February.   History Patient Active Problem List   Diagnosis Date Noted  . Prostate cancer ([place]) 01/10/2020  . Fatigue 05/24/2018  . Somnolence, daytime 05/24/2018  . Obstructive sleep apnea treated with continuous positive airway pressure (CPAP) 01/31/2018  . Snoring 09/28/2017  . UARS (upper airway resistance syndrome)  09/28/2017  . Retrognathia 09/28/2017  . Allergic rhinitis due to allergen 09/28/2017  . Obesity (BMI 30.0-34.9) 09/28/2017  . Acute URI 02/09/2017  . Chronic pain of left knee 02/09/2017  . Other seasonal allergic rhinitis 05/24/2014  . OSA on CPAP 05/24/2014  . GERD (gastroesophageal reflux disease)   . Dyslipidemia   . HYPOGODADISM MALE   . Cough 11/06/2008   Past Medical History:  Diagnosis Date  . Allergy    Zyrtec daily.  Flonase PRN.  . BPH (benign prostatic hyperplasia)   . Dyslipidemia   . Erectile dysfunction   . GERD (gastroesophageal reflux disease)   . Hyperlipidemia   . Hypogonadism male   . Obstructive sleep apnea   . OSA on CPAP   . Sleep apnea    Past Surgical History:  Procedure Laterality Date  . CHOLECYSTECTOMY    . COLONOSCOPY    . deviated septum-cyst    . EYE SURGERY     L eye tumor benign.  Marland Kitchen GALLBLADDER SURGERY    . INGUINAL HERNIA REPAIR N/A 01/10/2020   Procedure: HERNIA REPAIR UMBILICAL, ADULT;  Surgeon: Raynelle Bring, MD;  Location: [facility];  Service: Urology;  Laterality: N/A;  . LYMPHADENECTOMY Bilateral 01/10/2020   Procedure: LYMPHADENECTOMY, PELVIC;  Surgeon: Raynelle Bring, MD;  Location: [facility];  Service: Urology;  Laterality: Bilateral;  . PILONIDAL CYST EXCISION    . ROBOT ASSISTED LAPAROSCOPIC RADICAL PROSTATECTOMY N/A 01/10/2020   Procedure: XI ROBOTIC ASSISTED  LAPAROSCOPIC RADICAL PROSTATECTOMY LEVEL 2;  Surgeon: Raynelle Bring, MD;  Location: [facility];  Service: Urology;  Laterality: N/A;  . VASECTOMY     Allergies  Allergen Reactions  . Penicillins Rash    Teen Years   Prior to Admission medications   Medication Sig Start Date End Date Taking? Authorizing Provider  acetaminophen (TYLENOL) 500 MG tablet Take 500 mg by mouth every 6 (six) hours as needed for mild pain or headache.    Yes [provider]  cetirizine (ZYRTEC) 10 MG tablet Take 1 tablet (10 mg total) by mouth daily. Patient taking differently: Take 10 mg  by mouth at bedtime.  12/10/16  Yes McVey, Gelene Mink, PA-C  clindamycin (CLEOCIN) 300 MG capsule Take 300 mg by mouth every 6 (six) hours. 02/20/20  Yes [provider]  fluticasone (FLONASE) 50 MCG/ACT nasal spray Place 2 sprays into both nostrils daily. 12/10/16  Yes McVey, Gelene Mink, PA-C  montelukast (SINGULAIR) 10 MG tablet TAKE 1 TABLET BY MOUTH EVERYDAY AT BEDTIME Patient taking differently: Take 10 mg by mouth at bedtime.  08/10/19  Yes Wendie Agreste, MD  phentermine 37.5 MG capsule Take 37.5 mg by mouth daily.    Yes [provider]   Social History   Socioeconomic History  . Marital status: Married    Spouse name: Not on file  . Number of children: Not on file  . Years of education: Not on file  . Highest education level: Not on file  Occupational History  . Occupation: Best boy: [employer]  Tobacco Use  . Smoking status: Former Smoker    Quit date: 2002    Years since quitting: 19.5  . Smokeless tobacco: Never Used  Vaping Use  . Vaping Use: Never used  Substance and Sexual Activity  . Alcohol use: Yes    Alcohol/week: 4.0 standard drinks    Types: 4 Cans of beer per week  . Drug use: No  . Sexual activity: Yes    Birth control/protection: Other-see comments    Comment: vasectomy  Other Topics Concern  . Not on file  Social History Narrative   Marital status: married x 26 years.      Children: 3 children (23, 21, 16); no grandchildren.      Lives: with wife, 1 child at home       Employment: [employer] x 13 years.      Tobacco: quit smoking 15 years ago.      Alcohol: rarely      Exercise:  3 times per week (weights, cardio).      Seatbelt: 100%      Guns: none   Social Determinants of Radio broadcast assistant Strain:   . Difficulty of Paying Living Expenses:   Food Insecurity:   . Worried About Charity fundraiser in the Last Year:   . Arboriculturist in the Last Year:     Transportation Needs:   . Film/video editor (Medical):   Marland Kitchen Lack of Transportation (Non-Medical):   Physical Activity:   . Days of Exercise per Week:   . Minutes of Exercise per Session:   Stress:   . Feeling of Stress :   Social Connections:   . Frequency of Communication with Friends and Family:   . Frequency of Social Gatherings with Friends and Family:   . Attends Religious Services:   . Active Member of Clubs or Organizations:   .  Attends Archivist Meetings:   Marland Kitchen Marital Status:   Intimate Partner Violence:   . Fear of Current or Ex-Partner:   . Emotionally Abused:   Marland Kitchen Physically Abused:   . Sexually Abused:     Review of Systems  Constitutional: Negative for fatigue and unexpected weight change.  Eyes: Negative for visual disturbance.  Respiratory: Negative for cough, chest tightness and shortness of breath.   Cardiovascular: Negative for chest pain, palpitations and leg swelling.  Gastrointestinal: Negative for abdominal pain and blood in stool.  Neurological: Negative for dizziness (after surgery for about a week, asymptomatic now. ), light-headedness and headaches.     Objective:   Vitals:   02/22/20 0808 02/22/20 0813  BP: (!) 144/75 (!) 136/78  Pulse: 78   Temp: 98 F (36.7 C)   TempSrc: Temporal   SpO2: 99%   Weight: (!) 210 lb (95.3 kg)   Height: 6' (1.829 m)     Physical Exam Vitals reviewed.  Constitutional:      Appearance: He is well-developed.  HENT:     Head: Normocephalic and atraumatic.  Eyes:     Pupils: Pupils are equal, round, and reactive to light.  Neck:     Vascular: No carotid bruit or JVD.  Cardiovascular:     Rate and Rhythm: Normal rate and regular rhythm.     Heart sounds: Normal heart sounds. No murmur heard.   Pulmonary:     Effort: Pulmonary effort is normal.     Breath sounds: Normal breath sounds. No rales.  Abdominal:     Tenderness: There is no abdominal tenderness. There is no guarding or rebound.   Skin:    General: Skin is warm and dry.  Neurological:     Mental Status: He is alert and oriented to person, place, and time.        Assessment & Plan:  Hjalmar Ballengee is a 64 y.o. male . Hyperlipidemia, unspecified hyperlipidemia type - Plan: Lipid panel, Comprehensive metabolic panel  -Commended on diet changes and weight loss, anticipate improved readings.  Lab only visit planned.  OSA on CPAP  Some difficulty with mask fit.  Advised him to call his mask supplier or sleep specialist.  Allergic rhinitis, unspecified seasonality, unspecified trigger  -Stable with current regimen, no changes  Prostate cancer ([place])  -Status post prostatectomy.  Doing well  No orders of the defined types were placed in this encounter.  Patient Instructions    Call [facility] to discuss concerns about your CPAP mask and seal if you are not able to get info from [facility].   Great work on the weight loss and diet changes - I will check updated labs at lab only visit.   If any return of dizziness, follow-up to discuss the symptoms further.  Return to the clinic or go to the nearest emergency room if any of your symptoms worsen or new symptoms occur.    If you have lab work done today you will be contacted with your lab results within the next 2 weeks.  If you have not heard from [place] then please contact us. The fastest way to get your results is to register for My Chart.   IF you received an x-ray today, you will receive an invoice from [facility]. Please contact [facility] at [phone] with questions or concerns regarding your invoice.   IF you received labwork today, you will receive an invoice from [facility]. Please contact [facility] at [phone] with  questions or concerns regarding your invoice.   Our billing staff will not be able to assist you with questions regarding bills from these companies.  You will be contacted with the lab results as soon as they are  available. The fastest way to get your results is to activate your My Chart account. Instructions are located on the last page of this paperwork. If you have not heard from [place] regarding the results in 2 weeks, please contact this office.         Signed, Merri Ray, MD [facility]

## 2020-02-22 NOTE — Patient Instructions (Addendum)
  Call [facility] to discuss concerns about your CPAP mask and seal if you are not able to get info from [facility].   Great work on the weight loss and diet changes - I will check updated labs at lab only visit.   If any return of dizziness, follow-up to discuss the symptoms further.  Return to the clinic or go to the nearest emergency room if any of your symptoms worsen or new symptoms occur.    If you have lab work done today you will be contacted with your lab results within the next 2 weeks.  If you have not heard from [place] then please contact us. The fastest way to get your results is to register for My Chart.   IF you received an x-ray today, you will receive an invoice from [facility]. Please contact [facility] at [phone] with questions or concerns regarding your invoice.   IF you received labwork today, you will receive an invoice from [facility]. Please contact [facility] at [phone] with questions or concerns regarding your invoice.   Our billing staff will not be able to assist you with questions regarding bills from these companies.  You will be contacted with the lab results as soon as they are available. The fastest way to get your results is to activate your My Chart account. Instructions are located on the last page of this paperwork. If you have not heard from [place] regarding the results in 2 weeks, please contact this office.

## 2020-02-27 ENCOUNTER — Ambulatory Visit (INDEPENDENT_AMBULATORY_CARE_PROVIDER_SITE_OTHER): Payer: BC Managed Care – PPO | Admitting: Family Medicine

## 2020-02-27 ENCOUNTER — Other Ambulatory Visit: Payer: Self-pay

## 2020-02-27 DIAGNOSIS — E785 Hyperlipidemia, unspecified: Secondary | ICD-10-CM

## 2020-02-28 DIAGNOSIS — G4733 Obstructive sleep apnea (adult) (pediatric): Secondary | ICD-10-CM | POA: Diagnosis not present

## 2020-02-28 LAB — COMPREHENSIVE METABOLIC PANEL
ALT: 16 IU/L (ref 0–44)
AST: 18 IU/L (ref 0–40)
Albumin/Globulin Ratio: 1.7 (ref 1.2–2.2)
Albumin: 4 g/dL (ref 3.8–4.8)
Alkaline Phosphatase: 65 IU/L (ref 48–121)
BUN/Creatinine Ratio: 19 (ref 10–24)
BUN: 22 mg/dL (ref 8–27)
Bilirubin Total: 0.3 mg/dL (ref 0.0–1.2)
CO2: 24 mmol/L (ref 20–29)
Calcium: 9.2 mg/dL (ref 8.6–10.2)
Chloride: 105 mmol/L (ref 96–106)
Creatinine, Ser: 1.14 mg/dL (ref 0.76–1.27)
GFR calc Af Amer: 78 mL/min/{1.73_m2} (ref >59)
GFR calc non Af Amer: 68 mL/min/{1.73_m2} (ref >59)
Globulin, Total: 2.4 g/dL (ref 1.5–4.5)
Glucose: 91 mg/dL (ref 65–99)
Potassium: 4.6 mmol/L (ref 3.5–5.2)
Sodium: 142 mmol/L (ref 134–144)
Total Protein: 6.4 g/dL (ref 6.0–8.5)

## 2020-02-28 LAB — LIPID PANEL
Chol/HDL Ratio: 4 ratio (ref 0.0–5.0)
Cholesterol, Total: 182 mg/dL (ref 100–199)
HDL: 45 mg/dL (ref >39)
LDL Chol Calc (NIH): 125 mg/dL — ABNORMAL HIGH (ref 0–99)
Triglycerides: 66 mg/dL (ref 0–149)
VLDL Cholesterol Cal: 12 mg/dL (ref 5–40)

## 2020-03-03 ENCOUNTER — Encounter: Payer: Self-pay | Admitting: Family Medicine

## 2020-03-10 ENCOUNTER — Other Ambulatory Visit: Payer: Self-pay

## 2020-03-10 DIAGNOSIS — J309 Allergic rhinitis, unspecified: Secondary | ICD-10-CM

## 2020-03-10 MED ORDER — MONTELUKAST SODIUM 10 MG PO TABS: ORAL_TABLET | ORAL | 1 refills | Status: DC

## 2020-03-21 DIAGNOSIS — H6123 Impacted cerumen, bilateral: Secondary | ICD-10-CM | POA: Diagnosis not present

## 2020-03-21 DIAGNOSIS — H903 Sensorineural hearing loss, bilateral: Secondary | ICD-10-CM | POA: Diagnosis not present

## 2020-04-12 IMAGING — US BILATERAL CAROTID DUPLEX ULTRASOUND
1 series · 13 of 24 positions shown · non-contrast
Comparison: None.

CLINICAL DATA: 62-year-old male with new onset intermittent right
lower extremity numbness and weakness

EXAM:
BILATERAL CAROTID DUPLEX ULTRASOUND
TECHNIQUE: Gray scale imaging, color Doppler and duplex ultrasound were
performed of bilateral carotid and vertebral arteries in the neck.

[Series 1: bilateral carotid duplex ultrasound · 0.06mm/px · 13 of 44 slices shown]
[im 1/44]
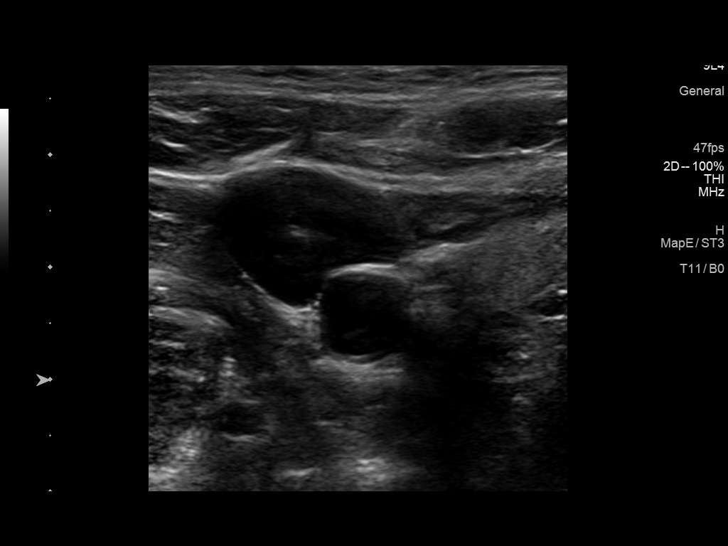
[im 4/44]
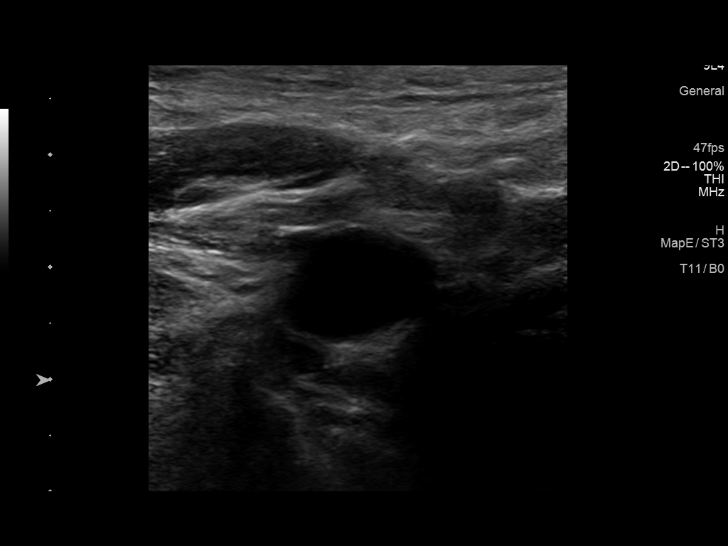
[im 8/44]
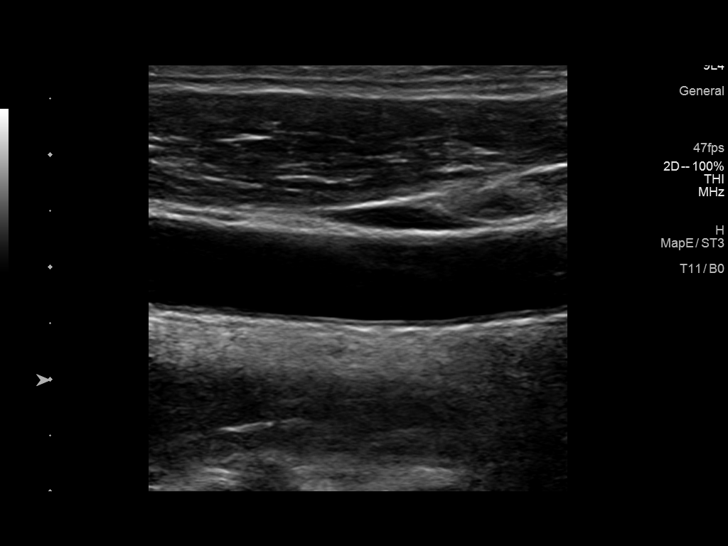
[im 12/44]
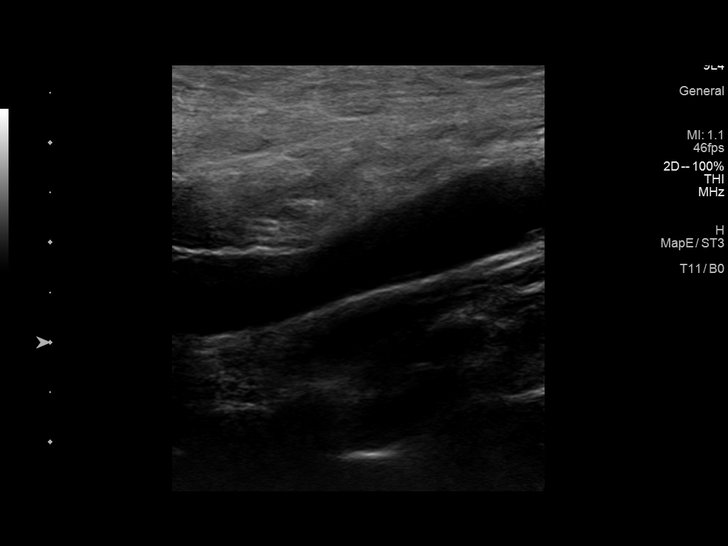
[im 15/44]
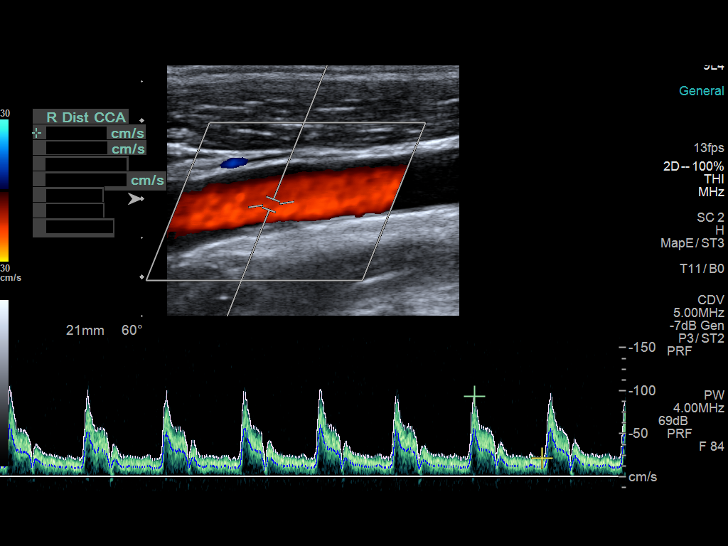
[im 19/44]
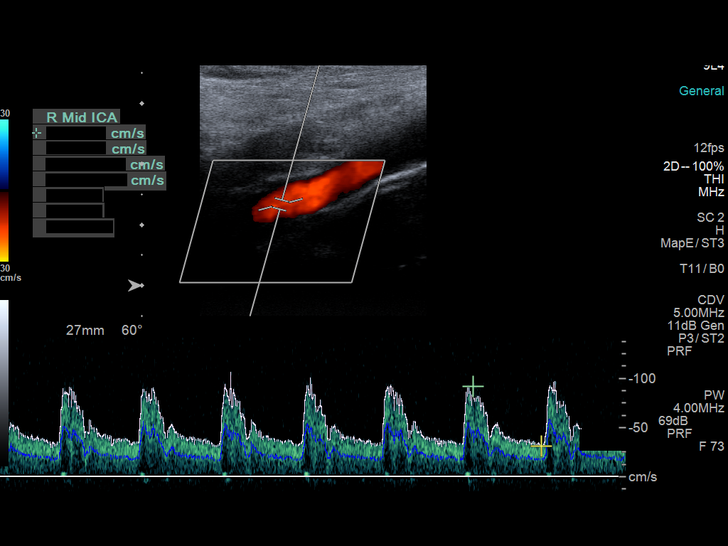
[im 23/44]
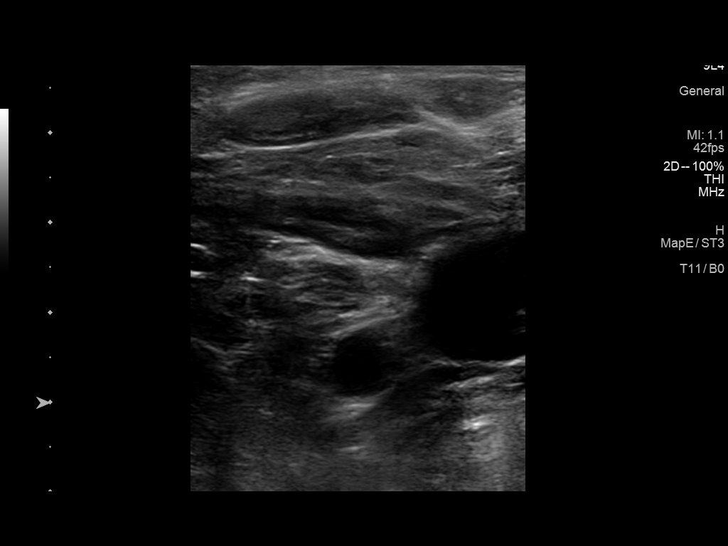
[im 25/44]
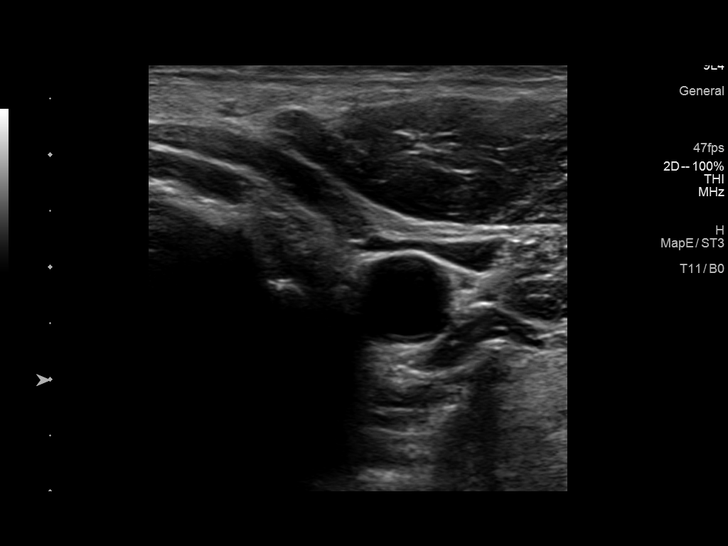
[im 29/44]
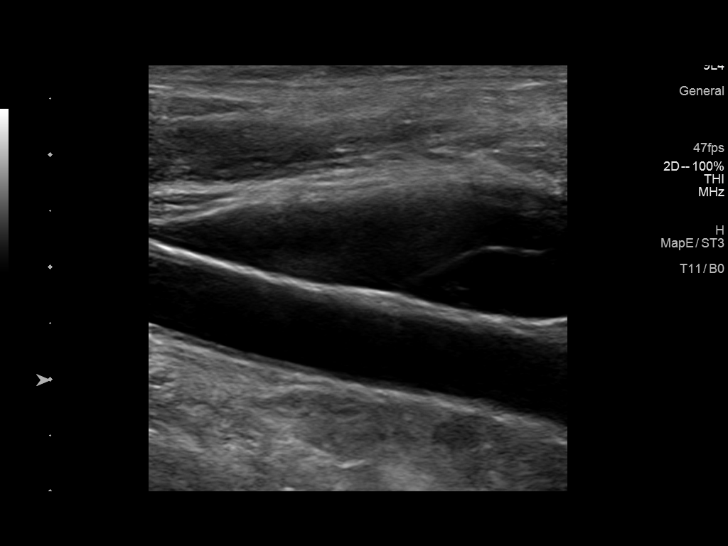
[im 32/44]
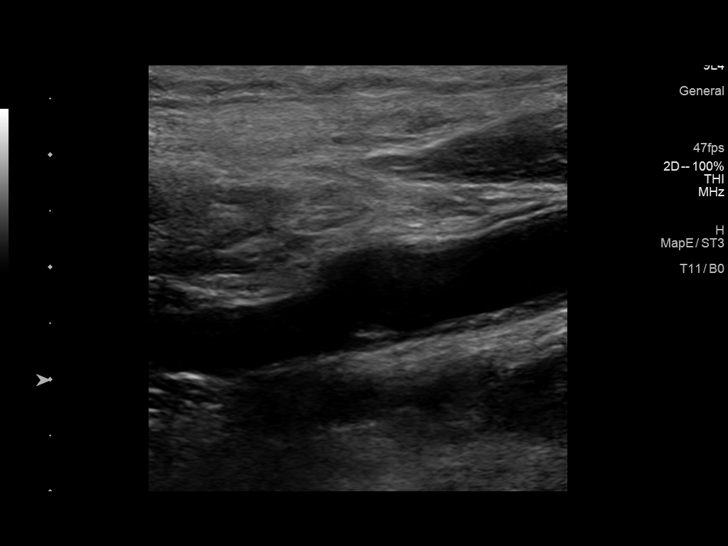
[im 36/44]
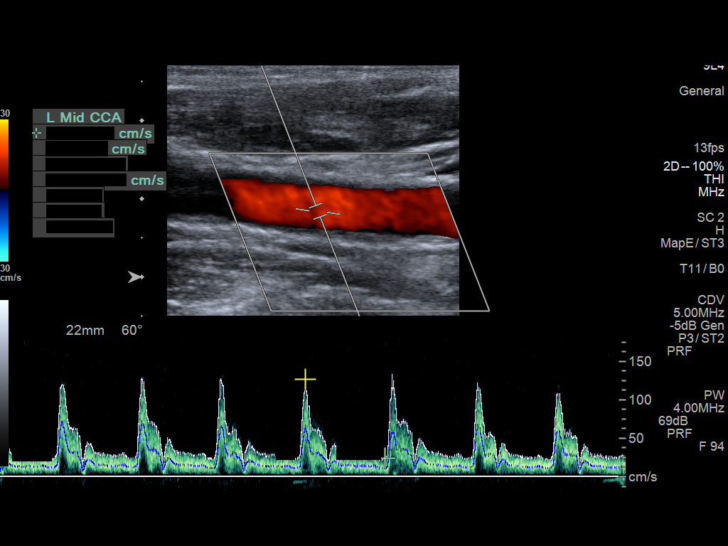
[im 40/44]
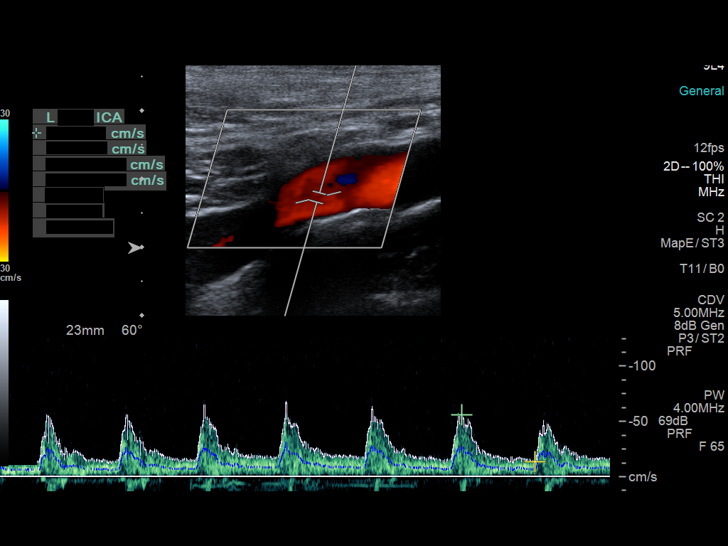
[im 44/44]
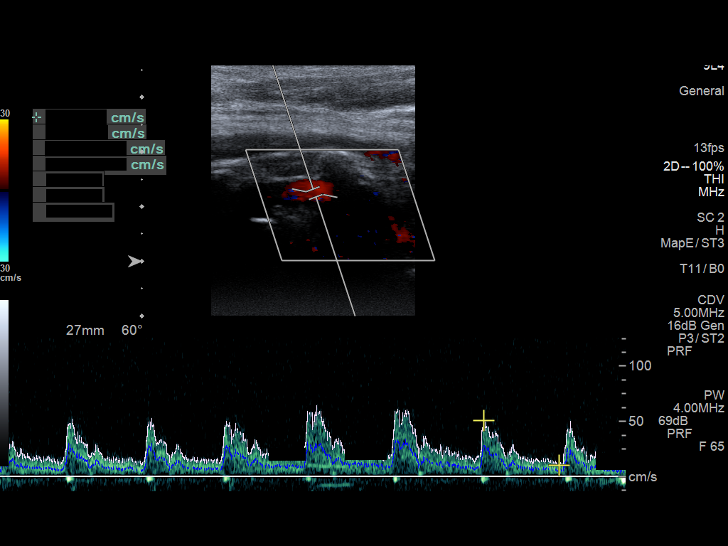

[13 of 24 positions shown; findings below may reference images not displayed]

FINDINGS: Criteria: Quantification of carotid stenosis is based on velocity
parameters that correlate the residual internal carotid diameter
with NASCET-based stenosis levels, using the diameter of the distal
internal carotid lumen as the denominator for stenosis measurement.

The following velocity measurements were obtained:

RIGHT
ICA: 92/31 cm/sec
CCA: 129/22 cm/sec

SYSTOLIC ICA/CCA RATIO:

ECA:  138 cm/sec

LEFT

ICA: 98/24 cm/sec

CCA: 129/24 cm/sec

SYSTOLIC ICA/CCA RATIO:

ECA:  126 cm/sec

RIGHT CAROTID ARTERY: No significant atherosclerotic plaque or
evidence of stenosis.

RIGHT VERTEBRAL ARTERY:  Patent with normal antegrade flow.

LEFT CAROTID ARTERY: No significant atherosclerotic plaque or
evidence of stenosis.

LEFT VERTEBRAL ARTERY:  Patent with normal antegrade flow.
IMPRESSION: Negative bilateral carotid duplex. No significant atherosclerotic
plaque or evidence of internal carotid artery stenosis.

## 2020-04-16 DIAGNOSIS — C61 Malignant neoplasm of prostate: Secondary | ICD-10-CM | POA: Diagnosis not present

## 2020-04-22 ENCOUNTER — Encounter: Payer: Self-pay | Admitting: Family Medicine

## 2020-05-02 DIAGNOSIS — N5201 Erectile dysfunction due to arterial insufficiency: Secondary | ICD-10-CM | POA: Diagnosis not present

## 2020-05-02 DIAGNOSIS — C61 Malignant neoplasm of prostate: Secondary | ICD-10-CM | POA: Diagnosis not present

## 2020-08-29 ENCOUNTER — Other Ambulatory Visit: Payer: Self-pay

## 2020-08-29 ENCOUNTER — Ambulatory Visit (INDEPENDENT_AMBULATORY_CARE_PROVIDER_SITE_OTHER): Payer: BC Managed Care – PPO | Admitting: Family Medicine

## 2020-08-29 ENCOUNTER — Encounter: Payer: Self-pay | Admitting: Family Medicine

## 2020-08-29 VITALS — BP 132/80 | HR 80 | Temp 97.7°F | Ht 72.0 in | Wt 220.2 lb

## 2020-08-29 DIAGNOSIS — R42 Dizziness and giddiness: Secondary | ICD-10-CM

## 2020-08-29 DIAGNOSIS — Z0000 Encounter for general adult medical examination without abnormal findings: Secondary | ICD-10-CM

## 2020-08-29 DIAGNOSIS — Z23 Encounter for immunization: Secondary | ICD-10-CM | POA: Diagnosis not present

## 2020-08-29 DIAGNOSIS — Z0001 Encounter for general adult medical examination with abnormal findings: Secondary | ICD-10-CM | POA: Diagnosis not present

## 2020-08-29 DIAGNOSIS — B360 Pityriasis versicolor: Secondary | ICD-10-CM | POA: Diagnosis not present

## 2020-08-29 DIAGNOSIS — E785 Hyperlipidemia, unspecified: Secondary | ICD-10-CM | POA: Diagnosis not present

## 2020-08-29 LAB — CBC
Hematocrit: 44.7 % (ref 37.5–51.0)
Hemoglobin: 14.8 g/dL (ref 13.0–17.7)
MCH: 30.2 pg (ref 26.6–33.0)
MCHC: 33.1 g/dL (ref 31.5–35.7)
MCV: 91 fL (ref 79–97)
Platelets: 182 10*3/uL (ref 150–450)
RBC: 4.9 x10E6/uL (ref 4.14–5.80)
RDW: 12.7 % (ref 11.6–15.4)
WBC: 5.2 10*3/uL (ref 3.4–10.8)

## 2020-08-29 LAB — LIPID PANEL
Chol/HDL Ratio: 4.7 ratio (ref 0.0–5.0)
Cholesterol, Total: 201 mg/dL — ABNORMAL HIGH (ref 100–199)
HDL: 43 mg/dL (ref >39)
LDL Chol Calc (NIH): 143 mg/dL — ABNORMAL HIGH (ref 0–99)
Triglycerides: 80 mg/dL (ref 0–149)
VLDL Cholesterol Cal: 15 mg/dL (ref 5–40)

## 2020-08-29 LAB — COMPREHENSIVE METABOLIC PANEL
ALT: 20 IU/L (ref 0–44)
AST: 23 IU/L (ref 0–40)
Albumin/Globulin Ratio: 2 (ref 1.2–2.2)
Albumin: 4.5 g/dL (ref 3.8–4.8)
Alkaline Phosphatase: 62 IU/L (ref 44–121)
BUN/Creatinine Ratio: 22 (ref 10–24)
BUN: 26 mg/dL (ref 8–27)
Bilirubin Total: 0.3 mg/dL (ref 0.0–1.2)
CO2: 22 mmol/L (ref 20–29)
Calcium: 9.5 mg/dL (ref 8.6–10.2)
Chloride: 104 mmol/L (ref 96–106)
Creatinine, Ser: 1.16 mg/dL (ref 0.76–1.27)
GFR calc Af Amer: 76 mL/min/{1.73_m2} (ref >59)
GFR calc non Af Amer: 66 mL/min/{1.73_m2} (ref >59)
Globulin, Total: 2.3 g/dL (ref 1.5–4.5)
Glucose: 104 mg/dL — ABNORMAL HIGH (ref 65–99)
Potassium: 4.6 mmol/L (ref 3.5–5.2)
Sodium: 141 mmol/L (ref 134–144)
Total Protein: 6.8 g/dL (ref 6.0–8.5)

## 2020-08-29 MED ORDER — KETOCONAZOLE 200 MG PO TABS: 400.0000 mg | ORAL_TABLET | Freq: Once | ORAL | 1 refills | Status: AC

## 2020-08-29 NOTE — Patient Instructions (Addendum)
Keep up with Dr. Brett Fairy if any difficulty with sleep.  Shingle vaccine at pharmacy.  nizoral 2 pills for rash. Refill ordered if needed.  Thanks for coming in today.     Preventive Care 21 Years and Older, Male Preventive care refers to lifestyle choices and visits with your health care provider that can promote health and wellness. This includes:  A yearly physical exam. This is also called an annual wellness visit.  Regular dental and eye exams.  Immunizations.  Screening for certain conditions.  Healthy lifestyle choices, such as: ? Eating a healthy diet. ? Getting regular exercise. ? Not using drugs or products that contain nicotine and tobacco. ? Limiting alcohol use. What can I expect for my preventive care visit? Physical exam Your health care provider will check your:  Height and weight. These may be used to calculate your BMI (body mass index). BMI is a measurement that tells if you are at a healthy weight.  Heart rate and blood pressure.  Body temperature.  Skin for abnormal spots. Counseling Your health care provider may ask you questions about your:  Past medical problems.  Family's medical history.  Alcohol, tobacco, and drug use.  Emotional well-being.  Home life and relationship well-being.  Sexual activity.  Diet, exercise, and sleep habits.  History of falls.  Memory and ability to understand (cognition).  Work and work Statistician.  Access to firearms. What immunizations do I need? Vaccines are usually given at various ages, according to a schedule. Your health care provider will recommend vaccines for you based on your age, medical history, and lifestyle or other factors, such as travel or where you work.   What tests do I need? Blood tests  Lipid and cholesterol levels. These may be checked every 5 years, or more often depending on your overall health.  Hepatitis C test.  Hepatitis B test. Screening  Lung cancer screening. You  may have this screening every year starting at age 62 if you have a 30-pack-year history of smoking and currently smoke or have quit within the past 15 years.  Colorectal cancer screening. ? All adults should have this screening starting at age 93 and continuing until age 23. ? Your health care provider may recommend screening at age 22 if you are at increased risk. ? You will have tests every 1-10 years, depending on your results and the type of screening test.  Prostate cancer screening. Recommendations will vary depending on your family history and other risks.  Genital exam to check for testicular cancer or hernias.  Diabetes screening. ? This is done by checking your blood sugar (glucose) after you have not eaten for a while (fasting). ? You may have this done every 1-3 years.  Abdominal aortic aneurysm (AAA) screening. You may need this if you are a current or former smoker.  STD (sexually transmitted disease) testing, if you are at risk. Follow these instructions at home: Eating and drinking  Eat a diet that includes fresh fruits and vegetables, whole grains, lean protein, and low-fat dairy products. Limit your intake of foods with high amounts of sugar, saturated fats, and salt.  Take vitamin and mineral supplements as recommended by your health care provider.  Do not drink alcohol if your health care provider tells you not to drink.  If you drink alcohol: ? Limit how much you have to 0-2 drinks a day. ? Be aware of how much alcohol is in your drink. In the [place] one drink equals one  12 oz bottle of beer (355 mL), one 5 oz glass of wine (148 mL), or one 1 oz glass of hard liquor (44 mL).   Lifestyle  Take daily care of your teeth and gums. Brush your teeth every morning and night with fluoride toothpaste. Floss one time each day.  Stay active. Exercise for at least 30 minutes 5 or more days each week.  Do not use any products that contain nicotine or tobacco, such as  cigarettes, e-cigarettes, and chewing tobacco. If you need help quitting, ask your health care provider.  Do not use drugs.  If you are sexually active, practice safe sex. Use a condom or other form of protection to prevent STIs (sexually transmitted infections).  Talk with your health care provider about taking a low-dose aspirin or statin.  Find healthy ways to cope with stress, such as: ? Meditation, yoga, or listening to music. ? Journaling. ? Talking to a trusted person. ? Spending time with friends and family. Safety  Always wear your seat belt while driving or riding in a vehicle.  Do not drive: ? If you have been drinking alcohol. Do not ride with someone who has been drinking. ? When you are tired or distracted. ? While texting.  Wear a helmet and other protective equipment during sports activities.  If you have firearms in your house, make sure you follow all gun safety procedures. What's next?  Visit your health care provider once a year for an annual wellness visit.  Ask your health care provider how often you should have your eyes and teeth checked.  Stay up to date on all vaccines. This information is not intended to replace advice given to you by your health care provider. Make sure you discuss any questions you have with your health care provider. Document Revised: 04/17/2019 Document Reviewed: 07/13/2018 Elsevier Patient Education  2021 [employer].   If you have lab work done today you will be contacted with your lab results within the next 2 weeks.  If you have not heard from [place] then please contact us. The fastest way to get your results is to register for My Chart.   IF you received an x-ray today, you will receive an invoice from [facility]. Please contact [facility]iology at [phone] with questions or concerns regarding your invoice.   IF you received labwork today, you will receive an invoice from [facility]. Please contact [facility] at  [phone] with questions or concerns regarding your invoice.   Our billing staff will not be able to assist you with questions regarding bills from these companies.  You will be contacted with the lab results as soon as they are available. The fastest way to get your results is to activate your My Chart account. Instructions are located on the last page of this paperwork. If you have not heard from [place] regarding the results in 2 weeks, please contact this office.

## 2020-08-29 NOTE — Progress Notes (Signed)
Subjective:  Patient ID: George Garner, male    DOB: [birth year]  Age: 65 y.o. MRN: [id number]  CC:  Chief Complaint  Patient presents with  . Annual Exam    CPE    HPI George Garner presents for   Annual physical exam  Care team Urology, Dr. Alinda Money, robotic assisted prostatectomy for prostate cancer in June 2021. Follow up soon.  Sleep specialist Dr. Brett Fairy, obstructive sleep apnea, CPAP. Still using cpap. Not feeling well rested, ongoing, better lately. Wakes up at 5am.   Hyperlipidemia: Diet/exercise approach, no current meds.  Lab Results  Component Value Date   CHOL 182 02/27/2020   HDL 45 02/27/2020   LDLCALC 125 (H) 02/27/2020   TRIG 66 02/27/2020   CHOLHDL 4.0 02/27/2020   Lab Results  Component Value Date   ALT 16 02/27/2020   AST 18 02/27/2020   ALKPHOS 65 02/27/2020   BILITOT 0.3 02/27/2020   Allergic rhinitis Singulair 10 mg daily with zyrtec QAM. Not needing flonase.   Tinea versicolor Treated with nizoral prior. Returned last few months.   Cancer screening Colonoscopy 04/14/2018, 10-year follow-up. Prostate cancer as above, followed by urology.  Immunization History  Administered Date(s) Administered  . Influenza Whole 04/29/2011  . Influenza,inj,Quad PF,6+ Mos 05/13/2014, 06/23/2015, 07/02/2016, 08/12/2017, 05/26/2018, 04/04/2019  . Tdap 06/08/2006, 07/02/2016  . Zoster 08/03/2011  Shingrix: recommended at pharmacy COVID-19: pfizer x3 with booster.  Pneumonia vaccine - prevnar.   Depression screen [facility] 2/9 08/29/2020 02/22/2020 11/26/2019 09/12/2019 08/29/2019  Decreased Interest 0 0 0 0 0  Down, Depressed, Hopeless 0 0 0 0 0  PHQ - 2 Score 0 0 0 0 0   Fall Risk  08/29/2020 02/22/2020 11/26/2019 09/12/2019 08/29/2019  Falls in the past year? 0 0 0 0 0  Number falls in past yr: 0 - 0 0 0  Injury with Fall? 0 - 0 0 0  Follow up Falls evaluation completed Falls evaluation completed Falls evaluation completed - -   Functional Status Survey: Is  the patient deaf or have difficulty hearing?: Yes Does the patient have difficulty seeing, even when wearing glasses/contacts?: No Does the patient have difficulty concentrating, remembering, or making decisions?: Yes (remembering things) Does the patient have difficulty walking or climbing stairs?: No Does the patient have difficulty dressing or bathing?: No Does the patient have difficulty doing errands alone such as visiting a doctor's office or shopping?: No Has hearing aids - not wearing everyday- interferes with mask. helps when wearing.  Musician - trouble remembering lyrics/cords. No recent changes. Can remember lyrics to songs since 63's.   Vertigo has resolved, recurrence few weeks ago that resolved after few days. Feeling fine now. Plans to follow-up if symptoms return.   6CIT Screen 08/29/2020  What Year? 0 points  What month? 0 points  What time? 0 points  Count back from 20 0 points  Months in reverse 0 points  Repeat phrase 0 points  Total Score 0   Flowsheet Row Office Visit from 08/29/2020 in [facility]  AUDIT-C Score 1     Dental: every 4 months.   Exercise: less with cold weather, plans to increase.   Advanced directives: has HCPOA, living will.    History Patient Active Problem List   Diagnosis Date Noted  . Prostate cancer ([place]) 01/10/2020  . Fatigue 05/24/2018  . Somnolence, daytime 05/24/2018  . Obstructive sleep apnea treated with continuous positive airway pressure (CPAP) 01/31/2018  . Snoring 09/28/2017  .  UARS (upper airway resistance syndrome) 09/28/2017  . Retrognathia 09/28/2017  . Allergic rhinitis due to allergen 09/28/2017  . Obesity (BMI 30.0-34.9) 09/28/2017  . Acute URI 02/09/2017  . Chronic pain of left knee 02/09/2017  . Other seasonal allergic rhinitis 05/24/2014  . OSA on CPAP 05/24/2014  . GERD (gastroesophageal reflux disease)   . Dyslipidemia   . HYPOGODADISM MALE   . Cough 11/06/2008   Past Medical History:   Diagnosis Date  . Allergy    Zyrtec daily.  Flonase PRN.  . BPH (benign prostatic hyperplasia)   . Dyslipidemia   . Erectile dysfunction   . GERD (gastroesophageal reflux disease)   . Hyperlipidemia   . Hypogonadism male   . Obstructive sleep apnea   . OSA on CPAP   . Sleep apnea    Past Surgical History:  Procedure Laterality Date  . CHOLECYSTECTOMY    . COLONOSCOPY    . deviated septum-cyst    . EYE SURGERY     L eye tumor benign.  Marland Kitchen GALLBLADDER SURGERY    . INGUINAL HERNIA REPAIR N/A 01/10/2020   Procedure: HERNIA REPAIR UMBILICAL, ADULT;  Surgeon: Raynelle Bring, MD;  Location: [facility];  Service: Urology;  Laterality: N/A;  . LYMPHADENECTOMY Bilateral 01/10/2020   Procedure: LYMPHADENECTOMY, PELVIC;  Surgeon: Raynelle Bring, MD;  Location: [facility];  Service: Urology;  Laterality: Bilateral;  . PILONIDAL CYST EXCISION    . PROSTATE SURGERY N/A    [employer]reesia 08/26/2020  . ROBOT ASSISTED LAPAROSCOPIC RADICAL PROSTATECTOMY N/A 01/10/2020   Procedure: XI ROBOTIC ASSISTED LAPAROSCOPIC RADICAL PROSTATECTOMY LEVEL 2;  Surgeon: Raynelle Bring, MD;  Location: [facility];  Service: Urology;  Laterality: N/A;  . VASECTOMY     Allergies  Allergen Reactions  . Penicillins Rash    Teen Years   Prior to Admission medications   Medication Sig Start Date End Date Taking? Authorizing Provider  acetaminophen (TYLENOL) 500 MG tablet Take 500 mg by mouth every 6 (six) hours as needed for mild pain or headache.    Yes [provider]  aspirin EC 81 MG tablet Take 81 mg by mouth daily. Swallow whole.   Yes [provider]  cetirizine (ZYRTEC) 10 MG tablet Take 1 tablet (10 mg total) by mouth daily. Patient taking differently: Take 10 mg by mouth at bedtime. 12/10/16  Yes McVey, Gelene Mink, PA-C  fluticasone (FLONASE) 50 MCG/ACT nasal spray Place 2 sprays into both nostrils daily. 12/10/16  Yes McVey, Gelene Mink, PA-C  montelukast (SINGULAIR) 10 MG tablet TAKE 1 TABLET BY  MOUTH EVERYDAY AT BEDTIME 03/10/20  Yes Wendie Agreste, MD   Social History   Socioeconomic History  . Marital status: Married    Spouse name: Not on file  . Number of children: Not on file  . Years of education: Not on file  . Highest education level: Not on file  Occupational History  . Occupation: Best boy: [employer]  Tobacco Use  . Smoking status: Former Smoker    Quit date: 2002    Years since quitting: 20.0  . Smokeless tobacco: Never Used  Vaping Use  . Vaping Use: Never used  Substance and Sexual Activity  . Alcohol use: Yes    Alcohol/week: 4.0 standard drinks    Types: 4 Cans of beer per week  . Drug use: No  . Sexual activity: Yes    Birth control/protection: Other-see comments    Comment: vasectomy  Other Topics Concern  .  Not on file  Social History Narrative   Marital status: married x 26 years.      Children: 3 children (23, 21, 16); no grandchildren.      Lives: with wife, 1 child at home       Employment: [employer] x 13 years.      Tobacco: quit smoking 15 years ago.      Alcohol: rarely      Exercise:  3 times per week (weights, cardio).      Seatbelt: 100%      Guns: none   Social Determinants of Radio broadcast assistant Strain: Not on file  Food Insecurity: Not on file  Transportation Needs: Not on file  Physical Activity: Not on file  Stress: Not on file  Social Connections: Not on file  Intimate Partner Violence: Not on file    Review of Systems 13 point review of systems per patient health survey noted.  Negative other than as indicated above or in HPI.    Objective:   Vitals:   08/29/20 0803 08/29/20 0813  BP: (!) 166/72 132/80  Pulse: 80   Temp: 97.7 F (36.5 C)   TempSrc: Temporal   SpO2: 98%   Weight: 220 lb 3.2 oz (99.9 kg)   Height: 6' (1.829 m)      Physical Exam Vitals reviewed.  Constitutional:      Appearance: He is well-developed.  HENT:     Head:  Normocephalic and atraumatic.     Right Ear: External ear normal.     Left Ear: External ear normal.  Eyes:     Conjunctiva/sclera: Conjunctivae normal.     Pupils: Pupils are equal, round, and reactive to light.  Neck:     Thyroid: No thyromegaly.  Cardiovascular:     Rate and Rhythm: Normal rate and regular rhythm.     Heart sounds: Normal heart sounds.  Pulmonary:     Effort: Pulmonary effort is normal. No respiratory distress.     Breath sounds: Normal breath sounds. No wheezing.  Abdominal:     General: There is no distension.     Palpations: Abdomen is soft.     Tenderness: There is no abdominal tenderness.  Musculoskeletal:        General: No tenderness. Normal range of motion.     Cervical back: Normal range of motion and neck supple.  Lymphadenopathy:     Cervical: No cervical adenopathy.  Skin:    General: Skin is warm and dry.     Comments: Faint slightly hyperpigmented patches on the anterior chest, mid chest.  Neurological:     Mental Status: He is alert and oriented to person, place, and time.     Deep Tendon Reflexes: Reflexes are normal and symmetric.  Psychiatric:        Behavior: Behavior normal.        Assessment & Plan:  George Garner is a 65 y.o. male . Annual physical exam  - -anticipatory guidance as below in AVS, screening labs above. Health maintenance items as above in HPI discussed/recommended as applicable.   Tinea versicolor - Plan: ketoconazole (NIZORAL) 200 MG tablet  -Nizoral 400 mg x 1 with repeat if needed, RTC precautions.  Need for prophylactic vaccination against Streptococcus pneumoniae (pneumococcus) - Plan: Pneumococcal conjugate vaccine 13-valent IM  -Prevnar given, then Pneumovax next year  Hyperlipidemia, unspecified hyperlipidemia type - Plan: Comprehensive metabolic panel, Lipid panel  -Mild, continue diet approach, check labs. Plans  increase exercise.  Vertigo - Plan: CBC  -Recurrence of dizziness few weeks ago, now  improved. RTC precautions if recurs but suspected vertigo. Meclizine over-the-counter discussed.  Meds ordered this encounter  Medications  . ketoconazole (NIZORAL) 200 MG tablet    Sig: Take 2 tablets (400 mg total) by mouth once for 1 dose.    Dispense:  2 tablet    Refill:  1   Patient Instructions   Keep up with Dr. Brett Fairy if any difficulty with sleep.  Shingle vaccine at pharmacy.  nizoral 2 pills for rash. Refill ordered if needed.  Thanks for coming in today.     Preventive Care 46 Years and Older, Male Preventive care refers to lifestyle choices and visits with your health care provider that can promote health and wellness. This includes:  A yearly physical exam. This is also called an annual wellness visit.  Regular dental and eye exams.  Immunizations.  Screening for certain conditions.  Healthy lifestyle choices, such as: ? Eating a healthy diet. ? Getting regular exercise. ? Not using drugs or products that contain nicotine and tobacco. ? Limiting alcohol use. What can I expect for my preventive care visit? Physical exam Your health care provider will check your:  Height and weight. These may be used to calculate your BMI (body mass index). BMI is a measurement that tells if you are at a healthy weight.  Heart rate and blood pressure.  Body temperature.  Skin for abnormal spots. Counseling Your health care provider may ask you questions about your:  Past medical problems.  Family's medical history.  Alcohol, tobacco, and drug use.  Emotional well-being.  Home life and relationship well-being.  Sexual activity.  Diet, exercise, and sleep habits.  History of falls.  Memory and ability to understand (cognition).  Work and work Statistician.  Access to firearms. What immunizations do I need? Vaccines are usually given at various ages, according to a schedule. Your health care provider will recommend vaccines for you based on your age,  medical history, and lifestyle or other factors, such as travel or where you work.   What tests do I need? Blood tests  Lipid and cholesterol levels. These may be checked every 5 years, or more often depending on your overall health.  Hepatitis C test.  Hepatitis B test. Screening  Lung cancer screening. You may have this screening every year starting at age 20 if you have a 30-pack-year history of smoking and currently smoke or have quit within the past 15 years.  Colorectal cancer screening. ? All adults should have this screening starting at age 61 and continuing until age 36. ? Your health care provider may recommend screening at age 63 if you are at increased risk. ? You will have tests every 1-10 years, depending on your results and the type of screening test.  Prostate cancer screening. Recommendations will vary depending on your family history and other risks.  Genital exam to check for testicular cancer or hernias.  Diabetes screening. ? This is done by checking your blood sugar (glucose) after you have not eaten for a while (fasting). ? You may have this done every 1-3 years.  Abdominal aortic aneurysm (AAA) screening. You may need this if you are a current or former smoker.  STD (sexually transmitted disease) testing, if you are at risk. Follow these instructions at home: Eating and drinking  Eat a diet that includes fresh fruits and vegetables, whole grains, lean protein, and low-fat dairy products.  Limit your intake of foods with high amounts of sugar, saturated fats, and salt.  Take vitamin and mineral supplements as recommended by your health care provider.  Do not drink alcohol if your health care provider tells you not to drink.  If you drink alcohol: ? Limit how much you have to 0-2 drinks a day. ? Be aware of how much alcohol is in your drink. In the [place] one drink equals one 12 oz bottle of beer (355 mL), one 5 oz glass of wine (148 mL), or one 1 oz glass  of hard liquor (44 mL).   Lifestyle  Take daily care of your teeth and gums. Brush your teeth every morning and night with fluoride toothpaste. Floss one time each day.  Stay active. Exercise for at least 30 minutes 5 or more days each week.  Do not use any products that contain nicotine or tobacco, such as cigarettes, e-cigarettes, and chewing tobacco. If you need help quitting, ask your health care provider.  Do not use drugs.  If you are sexually active, practice safe sex. Use a condom or other form of protection to prevent STIs (sexually transmitted infections).  Talk with your health care provider about taking a low-dose aspirin or statin.  Find healthy ways to cope with stress, such as: ? Meditation, yoga, or listening to music. ? Journaling. ? Talking to a trusted person. ? Spending time with friends and family. Safety  Always wear your seat belt while driving or riding in a vehicle.  Do not drive: ? If you have been drinking alcohol. Do not ride with someone who has been drinking. ? When you are tired or distracted. ? While texting.  Wear a helmet and other protective equipment during sports activities.  If you have firearms in your house, make sure you follow all gun safety procedures. What's next?  Visit your health care provider once a year for an annual wellness visit.  Ask your health care provider how often you should have your eyes and teeth checked.  Stay up to date on all vaccines. This information is not intended to replace advice given to you by your health care provider. Make sure you discuss any questions you have with your health care provider. Document Revised: 04/17/2019 Document Reviewed: 07/13/2018 Elsevier Patient Education  2021 [employer].   If you have lab work done today you will be contacted with your lab results within the next 2 weeks.  If you have not heard from [place] then please contact us. The fastest way to get your results is to  register for My Chart.   IF you received an x-ray today, you will receive an invoice from [facility]. Please contact [facility] at [phone] with questions or concerns regarding your invoice.   IF you received labwork today, you will receive an invoice from [facility]. Please contact [facility] at [phone] with questions or concerns regarding your invoice.   Our billing staff will not be able to assist you with questions regarding bills from these companies.  You will be contacted with the lab results as soon as they are available. The fastest way to get your results is to activate your My Chart account. Instructions are located on the last page of this paperwork. If you have not heard from [place] regarding the results in 2 weeks, please contact this office.          Signed, Merri Ray, MD [facility]

## 2020-11-02 ENCOUNTER — Other Ambulatory Visit: Payer: Self-pay | Admitting: Family Medicine

## 2020-11-02 DIAGNOSIS — J309 Allergic rhinitis, unspecified: Secondary | ICD-10-CM

## 2020-11-02 NOTE — Telephone Encounter (Signed)
Routing to Dr. Carlota Raspberry.

## 2021-03-30 ENCOUNTER — Encounter: Payer: Self-pay | Admitting: Family Medicine

## 2021-04-10 ENCOUNTER — Encounter: Payer: Self-pay | Admitting: Registered Nurse

## 2021-04-10 ENCOUNTER — Ambulatory Visit: Payer: BC Managed Care – PPO | Admitting: Registered Nurse

## 2021-04-10 ENCOUNTER — Other Ambulatory Visit: Payer: Self-pay

## 2021-04-10 VITALS — BP 130/80 | HR 71 | Temp 98.2°F | Resp 17 | Wt 234.4 lb

## 2021-04-10 DIAGNOSIS — Z23 Encounter for immunization: Secondary | ICD-10-CM | POA: Diagnosis not present

## 2021-04-10 DIAGNOSIS — M19042 Primary osteoarthritis, left hand: Secondary | ICD-10-CM | POA: Diagnosis not present

## 2021-04-10 MED ORDER — DICLOFENAC SODIUM 1 % EX GEL
2.0000 g | Freq: Four times a day (QID) | CUTANEOUS | 1 refills | Status: DC
Start: 2021-04-10 — End: 2021-08-17

## 2021-04-10 NOTE — Patient Instructions (Signed)
Mr. Stafford Christel to see you  Seems like some wear and tear arthritis in the joint of that finger  Use the topical diclofenac up to four times daily as needed  If not responding well, give [place] a call. Can consider xray or referral.  Thank you  Rich

## 2021-04-10 NOTE — Progress Notes (Signed)
Established Patient Office Visit  Subjective:  Patient ID: George Garner, male    DOB: 1956-04-12  Age: 65 y.o. MRN: [id number]  CC:  Chief Complaint  Patient presents with   Hand Pain    Patient states he has a dx of trigger finger    HPI George Garner presents for finger joint pain   L hand Ongoing, worsening Sore in the morning, can work it out, then [employer] as the day goes on [employer] a lot No acute injury or trauma No redness or swelling Full ROM, sometimes gets " caught " in extended position No radiation of pain   Interested in flu shot today.   Past Medical History:  Diagnosis Date   Allergy    Zyrtec daily.  Flonase PRN.   BPH (benign prostatic hyperplasia)    Dyslipidemia    Erectile dysfunction    GERD (gastroesophageal reflux disease)    Hyperlipidemia    Hypogonadism male    Obstructive sleep apnea    OSA on CPAP    Sleep apnea     Past Surgical History:  Procedure Laterality Date   CHOLECYSTECTOMY     COLONOSCOPY     deviated septum-cyst     EYE SURGERY     L eye tumor benign.   GALLBLADDER SURGERY     INGUINAL HERNIA REPAIR N/A 01/10/2020   Procedure: HERNIA REPAIR UMBILICAL, ADULT;  Surgeon: Raynelle Bring, MD;  Location: [facility];  Service: Urology;  Laterality: N/A;   LYMPHADENECTOMY Bilateral 01/10/2020   Procedure: LYMPHADENECTOMY, PELVIC;  Surgeon: Raynelle Bring, MD;  Location: [facility];  Service: Urology;  Laterality: Bilateral;   PILONIDAL CYST EXCISION     PROSTATE SURGERY N/A    [employer]rees[employer] 08/26/2020   ROBOT ASSISTED LAPAROSCOPIC RADICAL PROSTATECTOMY N/A 01/10/2020   Procedure: XI ROBOTIC ASSISTED LAPAROSCOPIC RADICAL PROSTATECTOMY LEVEL 2;  Surgeon: Raynelle Bring, MD;  Location: [facility];  Service: Urology;  Laterality: N/A;   VASECTOMY      Family History  Problem Relation Age of Onset   Diabetes Mother    Dementia Mother    Arthritis Brother    Colon cancer Neg Hx    Colon polyps Neg Hx    Esophageal cancer Neg Hx     Stomach cancer Neg Hx    Rectal cancer Neg Hx     Social History   Socioeconomic History   Marital status: Married    Spouse name: Not on file   Number of children: Not on file   Years of education: Not on file   Highest education level: Not on file  Occupational History   Occupation: minister    Employer: [employer]  Tobacco Use   Smoking status: Former    Types: Cigarettes    Quit date: 2002    Years since quitting: 20.7   Smokeless tobacco: Never  Vaping Use   Vaping Use: Never used  Substance and Sexual Activity   Alcohol use: Yes    Alcohol/week: 4.0 standard drinks    Types: 4 Cans of beer per week   Drug use: No   Sexual activity: Yes    Birth control/protection: Other-see comments    Comment: vasectomy  Other Topics Concern   Not on file  Social History Narrative   Marital status: married x 26 years.      Children: 3 children (23, 21, 16); no grandchildren.      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      Tobacco: quit smoking 15 years ago.      Alcohol: rarely      Exercise:  3 times per week (weights, cardio).      Seatbelt: 100%      Guns: none   Social Determinants of Radio broadcast assistant Strain: Not on file  Food Insecurity: Not on file  Transportation Needs: Not on file  Physical Activity: Not on file  Stress: Not on file  Social Connections: Not on file  Intimate Partner Violence: Not on file    Outpatient Medications Prior to Visit  Medication Sig Dispense Refill   acetaminophen (TYLENOL) 500 MG tablet Take 500 mg by mouth every 6 (six) hours as needed for mild pain or headache.      aspirin EC 81 MG tablet Take 81 mg by mouth daily. Swallow whole.     cetirizine (ZYRTEC) 10 MG tablet Take 1 tablet (10 mg total) by mouth daily. (Patient taking differently: Take 10 mg by mouth at bedtime.) 30 tablet 1   fluticasone (FLONASE) 50 MCG/ACT nasal spray Place 2 sprays  into both nostrils daily. 16 g 1   montelukast (SINGULAIR) 10 MG tablet TAKE 1 TABLET BY MOUTH EVERYDAY AT BEDTIME 90 tablet 1   Facility-Administered Medications Prior to Visit  Medication Dose Route Frequency Provider Last Rate Last Admin   0.9 %  sodium chloride infusion  500 mL Intravenous Once Danis, Kirke Corin, MD        Allergies  Allergen Reactions   Penicillins Rash    Teen Years    ROS Review of Systems  Constitutional: Negative.   HENT: Negative.    Eyes: Negative.   Respiratory: Negative.    Cardiovascular: Negative.   Gastrointestinal: Negative.   Genitourinary: Negative.   Musculoskeletal:  Positive for arthralgias. Negative for back pain, gait problem, joint swelling, myalgias, neck pain and neck stiffness.  Skin: Negative.   Neurological: Negative.   Psychiatric/Behavioral: Negative.    All other systems reviewed and are negative.    Objective:    Physical Exam Constitutional:      General: He is not in acute distress.    Appearance: Normal appearance. He is normal weight. He is not ill-appearing, toxic-appearing or diaphoretic.  Cardiovascular:     Rate and Rhythm: Normal rate and regular rhythm.     Heart sounds: Normal heart sounds. No murmur heard.   No friction rub. No gallop.  Pulmonary:     Effort: Pulmonary effort is normal. No respiratory distress.     Breath sounds: Normal breath sounds. No stridor. No wheezing, rhonchi or rales.  Chest:     Chest wall: No tenderness.  Musculoskeletal:     Right hand: Normal. No swelling, deformity or tenderness. Normal range of motion. Normal strength. Normal sensation. Normal capillary refill. Normal pulse.     Left hand: Tenderness (anterior MCP second digit) present. No swelling or deformity. Normal range of motion. Normal strength. Normal sensation. Normal capillary refill. Normal pulse.  Neurological:     General: No focal deficit present.     Mental Status: He is alert and oriented to person, place,  and time. Mental status is at baseline.  Psychiatric:        Mood and Affect: Mood normal.        Behavior: Behavior normal.        Thought Content: Thought content normal.  Judgment: Judgment normal.    BP 130/80   Pulse 71   Temp 98.2 F (36.8 C) (Temporal)   Resp 17   Wt 234 lb 6.4 oz (106.3 kg)   SpO2 98%   BMI 31.79 kg/m  Wt Readings from Last 3 Encounters:  04/10/21 234 lb 6.4 oz (106.3 kg)  08/29/20 220 lb 3.2 oz (99.9 kg)  02/22/20 (!) 210 lb (95.3 kg)     Health Maintenance Due  Topic Date Due   INFLUENZA VACCINE  03/02/2021    There are no preventive care reminders to display for this patient.  Lab Results  Component Value Date   TSH 2.540 01/08/2019   Lab Results  Component Value Date   WBC 5.2 08/29/2020   HGB 14.8 08/29/2020   HCT 44.7 08/29/2020   MCV 91 08/29/2020   PLT 182 08/29/2020   Lab Results  Component Value Date   NA 141 08/29/2020   K 4.6 08/29/2020   CO2 22 08/29/2020   GLUCOSE 104 (H) 08/29/2020   BUN 26 08/29/2020   CREATININE 1.16 08/29/2020   BILITOT 0.3 08/29/2020   ALKPHOS 62 08/29/2020   AST 23 08/29/2020   ALT 20 08/29/2020   PROT 6.8 08/29/2020   ALBUMIN 4.5 08/29/2020   CALCIUM 9.5 08/29/2020   ANIONGAP 8 01/02/2020   Lab Results  Component Value Date   CHOL 201 (H) 08/29/2020   Lab Results  Component Value Date   HDL 43 08/29/2020   Lab Results  Component Value Date   LDLCALC 143 (H) 08/29/2020   Lab Results  Component Value Date   TRIG 80 08/29/2020   Lab Results  Component Value Date   CHOLHDL 4.7 08/29/2020   Lab Results  Component Value Date   HGBA1C 5.6 06/23/2015      Assessment & Plan:   Problem List Items Addressed This Visit   None Visit Diagnoses     Arthritis of finger of left hand    -  Primary   Relevant Medications   diclofenac Sodium (VOLTAREN) 1 % GEL   Flu vaccine need       Relevant Orders   Flu Vaccine QUAD High Dose(Fluad)       Meds ordered this  encounter  Medications   diclofenac Sodium (VOLTAREN) 1 % GEL    Sig: Apply 2 g topically 4 (four) times daily.    Dispense:  100 g    Refill:  1    Order Specific Question:   Supervising Provider    Answer:   Carlota Raspberry, JEFFREY R [[id number]]    Follow-up: Return if symptoms worsen or fail to improve.   PLAN Suspect OA rather than trigger finger Treat with diclofenac Flu shot given Discussed with patient - will defer imaging and referral and monitor response Patient encouraged to call clinic with any questions, comments, or concerns.  Maximiano Coss, NP

## 2021-04-13 NOTE — Telephone Encounter (Signed)
Please advise 

## 2021-04-27 ENCOUNTER — Other Ambulatory Visit: Payer: Self-pay | Admitting: Registered Nurse

## 2021-04-27 DIAGNOSIS — M19042 Primary osteoarthritis, left hand: Secondary | ICD-10-CM

## 2021-04-27 NOTE — Progress Notes (Signed)
Persistent pain in finger of L hand despite conservative intervention Will refer to hand specialist.  Kathrin Ruddy, NP

## 2021-05-10 ENCOUNTER — Other Ambulatory Visit: Payer: Self-pay | Admitting: Family Medicine

## 2021-05-10 DIAGNOSIS — J309 Allergic rhinitis, unspecified: Secondary | ICD-10-CM

## 2021-08-17 ENCOUNTER — Encounter: Payer: Self-pay | Admitting: Family Medicine

## 2021-08-17 ENCOUNTER — Ambulatory Visit (INDEPENDENT_AMBULATORY_CARE_PROVIDER_SITE_OTHER): Payer: BC Managed Care – PPO | Admitting: Family Medicine

## 2021-08-17 VITALS — BP 126/68 | HR 78 | Temp 98.4°F | Resp 16 | Ht 72.0 in | Wt 244.4 lb

## 2021-08-17 DIAGNOSIS — R0981 Nasal congestion: Secondary | ICD-10-CM | POA: Diagnosis not present

## 2021-08-17 DIAGNOSIS — J988 Other specified respiratory disorders: Secondary | ICD-10-CM

## 2021-08-17 DIAGNOSIS — R059 Cough, unspecified: Secondary | ICD-10-CM

## 2021-08-17 MED ORDER — IPRATROPIUM BROMIDE 0.06 % NA SOLN: 1.0000 | Freq: Four times a day (QID) | NASAL | 5 refills | Status: AC

## 2021-08-17 MED ORDER — AZITHROMYCIN 250 MG PO TABS: ORAL_TABLET | ORAL | 0 refills | Status: DC

## 2021-08-17 NOTE — Patient Instructions (Addendum)
Let me know if cough syrup needed for sleep. Zpak sent to pharmacy if not continuing to improve next few days.  Atrovent nasal spray at pharmacy if needed for congestion, but can try saline nasal spray as well.  Thanks for coming in today and hope things continue to improve.

## 2021-08-17 NOTE — Progress Notes (Signed)
Subjective:  Patient ID: George Garner, male    DOB: Mar 13, 1956  Age: 66 y.o. MRN: [id number]  CC:  Chief Complaint  Patient presents with   Cough    Pt reports 1 week ago on Saturday, sxs started cough, congestion, sore throat  in mornings, sinus drainage, denies fever, dull headache     HPI George Garner presents for   Cough: Cough, congestion, sinus drainage, sore throat starting approximately 9 days ago.  No fever.  Slight headache. More cough at night - trouble sleeping until used delsym and alka seltzer plus. Works better. Slight improvement past 2 days. Better sleep.  Some mild dyspnea with congestion.  Home covid test negative after few days of symptoms.  Sick contacts: none.  Attempted treatments: delsym, alka seltzer, tylenol pm.  No relief with tessalon in past.   History of allergic rhinitis, upper airway resistance syndrome, treated with Flonase, Zyrtec, Singulair daily.   History Patient Active Problem List   Diagnosis Date Noted   Prostate cancer ([place]) 01/10/2020   Fatigue 05/24/2018   Somnolence, daytime 05/24/2018   Obstructive sleep apnea treated with continuous positive airway pressure (CPAP) 01/31/2018   Snoring 09/28/2017   UARS (upper airway resistance syndrome) 09/28/2017   Retrognathia 09/28/2017   Allergic rhinitis due to allergen 09/28/2017   Obesity (BMI 30.0-34.9) 09/28/2017   Chronic pain of left knee 02/09/2017   Other seasonal allergic rhinitis 05/24/2014   Dyslipidemia    HYPOGODADISM MALE    Past Medical History:  Diagnosis Date   Allergy    Zyrtec daily.  Flonase PRN.   BPH (benign prostatic hyperplasia)    Dyslipidemia    Erectile dysfunction    GERD (gastroesophageal reflux disease)    Hyperlipidemia    Hypogonadism male    Obstructive sleep apnea    OSA on CPAP    Sleep apnea    Past Surgical History:  Procedure Laterality Date   CHOLECYSTECTOMY     COLONOSCOPY     deviated septum-cyst     EYE SURGERY     L eye tumor  benign.   GALLBLADDER SURGERY     INGUINAL HERNIA REPAIR N/A 01/10/2020   Procedure: HERNIA REPAIR UMBILICAL, ADULT;  Surgeon: Raynelle Bring, MD;  Location: [facility];  Service: Urology;  Laterality: N/A;   LYMPHADENECTOMY Bilateral 01/10/2020   Procedure: LYMPHADENECTOMY, PELVIC;  Surgeon: Raynelle Bring, MD;  Location: [facility];  Service: Urology;  Laterality: Bilateral;   PILONIDAL CYST EXCISION     PROSTATE SURGERY N/A    [employer] 08/26/2020   ROBOT ASSISTED LAPAROSCOPIC RADICAL PROSTATECTOMY N/A 01/10/2020   Procedure: XI ROBOTIC ASSISTED LAPAROSCOPIC RADICAL PROSTATECTOMY LEVEL 2;  Surgeon: Raynelle Bring, MD;  Location: [facility];  Service: Urology;  Laterality: N/A;   VASECTOMY     Allergies  Allergen Reactions   Penicillins Rash    Teen Years   Prior to Admission medications   Medication Sig Start Date End Date Taking? Authorizing Provider  acetaminophen (TYLENOL) 500 MG tablet Take 500 mg by mouth every 6 (six) hours as needed for mild pain or headache.    Yes [provider]  aspirin EC 81 MG tablet Take 81 mg by mouth daily. Swallow whole.   Yes [provider]  cetirizine (ZYRTEC) 10 MG tablet Take 1 tablet (10 mg total) by mouth daily. Patient taking differently: Take 10 mg by mouth at bedtime. 12/10/16  Yes McVey, Gelene Mink, PA-C  fluticasone (FLONASE) 50 MCG/ACT nasal spray Place 2  sprays into both nostrils daily. 12/10/16  Yes McVey, Gelene Mink, PA-C  montelukast (SINGULAIR) 10 MG tablet TAKE 1 TABLET BY MOUTH EVERYDAY AT BEDTIME 05/11/21  Yes Wendie Agreste, MD   Social History   Socioeconomic History   Marital status: Married    Spouse name: Not on file   Number of children: Not on file   Years of education: Not on file   Highest education level: Not on file  Occupational History   Occupation: minister    Employer: [employer]  Tobacco Use   Smoking status: Former    Types: Cigarettes    Quit date: 2002    Years  since quitting: 21.0   Smokeless tobacco: Never  Vaping Use   Vaping Use: Never used  Substance and Sexual Activity   Alcohol use: Yes    Alcohol/week: 4.0 standard drinks    Types: 4 Cans of beer per week   Drug use: No   Sexual activity: Yes    Birth control/protection: Other-see comments    Comment: vasectomy  Other Topics Concern   Not on file  Social History Narrative   Marital status: married x 26 years.      Children: 3 children (23, 21, 16); no grandchildren.      Lives: with wife, 1 child at home       Employment: [employer] x 13 years.      Tobacco: quit smoking 15 years ago.      Alcohol: rarely      Exercise:  3 times per week (weights, cardio).      Seatbelt: 100%      Guns: none   Social Determinants of Radio broadcast assistant Strain: Not on file  Food Insecurity: Not on file  Transportation Needs: Not on file  Physical Activity: Not on file  Stress: Not on file  Social Connections: Not on file  Intimate Partner Violence: Not on file    Review of Systems Per HPI.  Objective:   Vitals:   08/17/21 1309  BP: 126/68  Pulse: 78  Resp: 16  Temp: 98.4 F (36.9 C)  TempSrc: Temporal  SpO2: 95%  Weight: 244 lb 6.4 oz (110.9 kg)  Height: 6' (1.829 m)     Physical Exam Vitals reviewed.  Constitutional:      Appearance: He is well-developed.  HENT:     Head: Normocephalic and atraumatic.     Right Ear: Tympanic membrane, ear canal and external ear normal.     Left Ear: Tympanic membrane, ear canal and external ear normal.     Nose: No rhinorrhea.     Comments: Congestion in maxillary sinuses but no focal tenderness.    Mouth/Throat:     Pharynx: No oropharyngeal exudate or posterior oropharyngeal erythema.  Eyes:     Conjunctiva/sclera: Conjunctivae normal.     Pupils: Pupils are equal, round, and reactive to light.  Cardiovascular:     Rate and Rhythm: Normal rate and regular rhythm.     Heart sounds: Normal heart  sounds. No murmur heard. Pulmonary:     Effort: Pulmonary effort is normal.     Breath sounds: Normal breath sounds. No wheezing, rhonchi or rales.  Abdominal:     Palpations: Abdomen is soft.     Tenderness: There is no abdominal tenderness.  Musculoskeletal:     Cervical back: Neck supple.  Lymphadenopathy:     Cervical: No cervical adenopathy.  Skin:  General: Skin is warm and dry.     Findings: No rash.  Neurological:     Mental Status: He is alert and oriented to person, place, and time.  Psychiatric:        Behavior: Behavior normal.       Assessment & Plan:  George Garner is a 66 y.o. male . Nasal congestion - Plan: azithromycin (ZITHROMAX) 250 MG tablet, ipratropium (ATROVENT) 0.06 % nasal spray  Cough, unspecified type - Plan: azithromycin (ZITHROMAX) 250 MG tablet  Respiratory infection - Plan: azithromycin (ZITHROMAX) 250 MG tablet  Suspected viral infection with negative COVID testing at home.  Symptoms have worsened into this past weekend with some improvement with improved sleep.  Reassuring exam, vitals.   - Symptomatic care with fluids, rest, continue routine allergy meds, Delsym at night has been effective. Continue same. -  Option of other cough suppressant if needed and call. -Atrovent nasal spray and saline nasal spray discussed as options for nasal congestion.  - Azithromycin sent to pharmacy if needed/not improving in the next few days but anticipate this will continue to improve.  Potential side effects and risks of meds discussed.  RTC precautions.  Meds ordered this encounter  Medications   azithromycin (ZITHROMAX) 250 MG tablet    Sig: Take 2 pills by mouth on day 1, then 1 pill by mouth per day on days 2 through 5.    Dispense:  6 tablet    Refill:  0    Ok to place on hold if needed.   ipratropium (ATROVENT) 0.06 % nasal spray    Sig: Place 1-2 sprays into both nostrils 4 (four) times daily. As needed for nasal congestion    Dispense:  15 mL     Refill:  5   Patient Instructions  Let me know if cough syrup needed for sleep. Zpak sent to pharmacy if not continuing to improve next few days.  Atrovent nasal spray at pharmacy if needed for congestion, but can try saline nasal spray as well.  Thanks for coming in today and hope things continue to improve.     Signed,   Merri Ray, MD [place] 08/17/21 1:34 PM

## 2021-10-01 ENCOUNTER — Ambulatory Visit (INDEPENDENT_AMBULATORY_CARE_PROVIDER_SITE_OTHER): Payer: Medicare Other | Admitting: Family Medicine

## 2021-10-01 ENCOUNTER — Encounter: Payer: Self-pay | Admitting: Family Medicine

## 2021-10-01 VITALS — BP 124/76 | HR 76 | Temp 97.9°F | Resp 15 | Ht 72.0 in | Wt 246.6 lb

## 2021-10-01 DIAGNOSIS — B360 Pityriasis versicolor: Secondary | ICD-10-CM

## 2021-10-01 DIAGNOSIS — Z0000 Encounter for general adult medical examination without abnormal findings: Secondary | ICD-10-CM | POA: Diagnosis not present

## 2021-10-01 DIAGNOSIS — Z23 Encounter for immunization: Secondary | ICD-10-CM | POA: Diagnosis not present

## 2021-10-01 DIAGNOSIS — Z131 Encounter for screening for diabetes mellitus: Secondary | ICD-10-CM

## 2021-10-01 DIAGNOSIS — Z136 Encounter for screening for cardiovascular disorders: Secondary | ICD-10-CM | POA: Diagnosis not present

## 2021-10-01 DIAGNOSIS — J309 Allergic rhinitis, unspecified: Secondary | ICD-10-CM

## 2021-10-01 DIAGNOSIS — H9193 Unspecified hearing loss, bilateral: Secondary | ICD-10-CM

## 2021-10-01 DIAGNOSIS — E785 Hyperlipidemia, unspecified: Secondary | ICD-10-CM | POA: Diagnosis not present

## 2021-10-01 DIAGNOSIS — R739 Hyperglycemia, unspecified: Secondary | ICD-10-CM

## 2021-10-01 DIAGNOSIS — R9431 Abnormal electrocardiogram [ECG] [EKG]: Secondary | ICD-10-CM

## 2021-10-01 LAB — COMPREHENSIVE METABOLIC PANEL
ALT: 27 U/L (ref 0–53)
AST: 24 U/L (ref 0–37)
Albumin: 4.2 g/dL (ref 3.5–5.2)
Alkaline Phosphatase: 47 U/L (ref 39–117)
BUN: 21 mg/dL (ref 6–23)
CO2: 29 mEq/L (ref 19–32)
Calcium: 9.2 mg/dL (ref 8.4–10.5)
Chloride: 103 mEq/L (ref 96–112)
Creatinine, Ser: 1.22 mg/dL (ref 0.40–1.50)
GFR: 61.75 mL/min (ref >60.00)
Glucose, Bld: 97 mg/dL (ref 70–99)
Potassium: 4.2 mEq/L (ref 3.5–5.1)
Sodium: 138 mEq/L (ref 135–145)
Total Bilirubin: 0.6 mg/dL (ref 0.2–1.2)
Total Protein: 6.5 g/dL (ref 6.0–8.3)

## 2021-10-01 LAB — LIPID PANEL
Cholesterol: 207 mg/dL — ABNORMAL HIGH (ref 0–200)
HDL: 43.7 mg/dL (ref >39.00)
LDL Cholesterol: 132 mg/dL — ABNORMAL HIGH (ref 0–99)
NonHDL: 163.36
Total CHOL/HDL Ratio: 5
Triglycerides: 158 mg/dL — ABNORMAL HIGH (ref 0.0–149.0)
VLDL: 31.6 mg/dL (ref 0.0–40.0)

## 2021-10-01 LAB — HEMOGLOBIN A1C: Hgb A1c MFr Bld: 5.5 % (ref 4.6–6.5)

## 2021-10-01 MED ORDER — KETOCONAZOLE 200 MG PO TABS: 400.0000 mg | ORAL_TABLET | Freq: Every day | ORAL | 1 refills | Status: DC

## 2021-10-01 MED ORDER — MONTELUKAST SODIUM 10 MG PO TABS: 10.0000 mg | ORAL_TABLET | Freq: Every day | ORAL | 3 refills | Status: DC

## 2021-10-01 NOTE — Progress Notes (Signed)
Subjective:  Patient ID: George Garner, male    DOB: 01-Apr-1956  Age: 66 y.o. MRN: [id number]  CC:  Chief Complaint  Patient presents with   Medicare Wellness    Pt here for welcome to medicare wellness, no concrns per pt, per pt he has had COVID and Shingles vaccine will look up on [facility] has not had pneumonia since 08/29/20    HPI George Garner presents for   Presents for welcome to medicare exam.  Retired Jan 1st. Feels good, no new concerns.   Care team: PCP: me Hand/ortho: Kuzma - trigger finger, dog bite.  Urology:[facility] - hx of prostate CA, prostatectomy 01/10/20.  ENT : [facility] Sleep neuro: Dohmeier - OSA on CPAP.   Singulair, Zyrtec for allergic rhinitis, has restarted Flonase - working well.   Tinea versicolor Notices during warm weather, treated with nizoral in past. Started to notice recently again on chest, upper shoulders. No recent treatment.   Obesity/hyperlipidemia/hyperglycemia diet/exercise approach for hyperlipidemia.  Levels had increased slightly last year with ASCVD risk score 14.6%.  Option of statin versus recheck levels in 3 to 6 months with diet/exercise changes.  Has not been checked since January of last year.  Weight increased from 220 last January to 246 currently. Glucose mildly elevated at 104 08/29/2020. Off his prior low carb diet. Working on trying to lose weight - watching calories.  Exercise: 3-5 hours per day - recently started yardwork. Walking some.  Does not want to take statin if not needed. Maternal FH of heart disease in siblings in 99's but mother and father without heart disease, older siblings without heart disease.  No CP/dyspnea with exertion.  Lab Results  Component Value Date   CHOL 201 (H) 08/29/2020   HDL 43 08/29/2020   LDLCALC 143 (H) 08/29/2020   TRIG 80 08/29/2020   CHOLHDL 4.7 08/29/2020  Body mass index is 33.44 kg/m.  Wt Readings from Last 3 Encounters:  10/01/21 246 lb 9.6 oz (111.9 kg)  08/17/21 244 lb 6.4 oz  (110.9 kg)  04/10/21 234 lb 6.4 oz (106.3 kg)     Fall screening Fall Risk  10/01/2021 08/17/2021 04/10/2021 08/29/2020 02/22/2020  Falls in the past year? 0 0 0 0 0  Number falls in past yr: 0 0 0 0 -  Injury with Fall? 0 0 0 0 -  Risk for fall due to : No Fall Risks No Fall Risks No Fall Risks - -  Follow up Falls evaluation completed Falls evaluation completed Falls evaluation completed Falls evaluation completed Falls evaluation completed   Lighting in home: adequate Loose rugs/carpets/pets:none Stairs:to basement - has handrail. Most of living space single level.  Grab bars in bathroom:none present.  Timed up and go:7 seconds, normal gait, no instability.   Depression Screening: Depression screen [facility] 2/9 10/01/2021 08/17/2021 04/10/2021 08/29/2020 02/22/2020  Decreased Interest 0 0 0 0 0  Down, Depressed, Hopeless 0 0 0 0 0  PHQ - 2 Score 0 0 0 0 0  Altered sleeping 2 1 1  - -  Tired, decreased energy 1 2 1  - -  Change in appetite 0 0 0 - -  Feeling bad or failure about yourself  0 0 0 - -  Trouble concentrating 0 0 0 - -  Moving slowly or fidgety/restless 0 0 0 - -  Suicidal thoughts 0 0 0 - -  PHQ-9 Score 3 3 2  - -  Difficult doing work/chores - - Not difficult at all - -  On CPAP - recommended discussing fatigue with sleep specialist. Will call. [place] wit sleep onset, up early.   Cancer Screening: Colonoscopy 04/14/2018, 10-year follow-up. Followed by urology with history of prostate cancer, status post prostatectomy. Saw urology in January.   Immunization History  Administered Date(s) Administered   Fluad Quad(high Dose 65+) 04/10/2021   Influenza Whole 04/29/2011   Influenza,inj,Quad PF,6+ Mos 05/13/2014, 06/23/2015, 07/02/2016, 08/12/2017, 05/26/2018, 04/04/2019   PFIZER Comirnaty(Gray Top)Covid-19 Tri-Sucrose Vaccine 09/21/2019, 10/15/2019, 05/09/2020, 02/13/2021   Pneumococcal Conjugate-13 08/29/2020   Tdap 06/08/2006, 07/02/2016   Zoster, Live 08/03/2011  Shingles and flu  vaccines received at his pharmacy.   He has received COVID-19 Pfizer vaccine x3 with booster.  Most recent booster over 6 months.  Due for Pneumovax, Prevnar given last year.  Functional Status Survey: Is the patient deaf or have difficulty hearing?: Yes Does the patient have difficulty seeing, even when wearing glasses/contacts?: No Does the patient have difficulty concentrating, remembering, or making decisions?: No Does the patient have difficulty walking or climbing stairs?: No Does the patient have difficulty dressing or bathing?: No Does the patient have difficulty doing errands alone such as visiting a doctor's office or shopping?: No Has seen ENT, audiology for difficulty with hearing.  Has hearing aids,  not using - could not tell much of a difference. Received at [place]: 6CIT Screen 10/01/2021 08/29/2020  What Year? 0 points 0 points  What month? 0 points 0 points  What time? 0 points 0 points  Count back from 20 0 points 0 points  Months in reverse 0 points 0 points  Repeat phrase 0 points 0 points  Total Score 0 0    Alcohol Screening: [facility] Office Visit from 10/01/2021 in [place]  AUDIT-C Score 0     1-2 drinks per day.   Tobacco: None.  Vision Screening   Right eye Left eye Both eyes  Without correction     With correction 20/20 20/20-2 20/20-1   Optho/optometry: no recent visit - 2 years - plans to schedule.   Dental: Every 4 months.   Exercise: As above.   Advanced Directives:  Has living will, HCPOA. Plans to update and send copy.   History Patient Active Problem List   Diagnosis Date Noted   Prostate cancer ([place]) 01/10/2020   Fatigue 05/24/2018   Somnolence, daytime 05/24/2018   Obstructive sleep apnea treated with continuous positive airway pressure (CPAP) 01/31/2018   Snoring 09/28/2017   UARS (upper airway resistance syndrome) 09/28/2017   Retrognathia 09/28/2017   Allergic  rhinitis due to allergen 09/28/2017   Obesity (BMI 30.0-34.9) 09/28/2017   Chronic pain of left knee 02/09/2017   Other seasonal allergic rhinitis 05/24/2014   Dyslipidemia    HYPOGODADISM MALE    Past Medical History:  Diagnosis Date   Allergy    Zyrtec daily.  Flonase PRN.   BPH (benign prostatic hyperplasia)    Dyslipidemia    Erectile dysfunction    GERD (gastroesophageal reflux disease)    Hyperlipidemia    Hypogonadism male    Obstructive sleep apnea    OSA on CPAP    Sleep apnea    Past Surgical History:  Procedure Laterality Date   CHOLECYSTECTOMY     COLONOSCOPY     deviated septum-cyst     EYE SURGERY     L eye tumor benign.   GALLBLADDER SURGERY     INGUINAL HERNIA REPAIR N/A 01/10/2020   Procedure: HERNIA  REPAIR UMBILICAL, ADULT;  Surgeon: Raynelle Bring, MD;  Location: [facility];  Service: Urology;  Laterality: N/A;   LYMPHADENECTOMY Bilateral 01/10/2020   Procedure: LYMPHADENECTOMY, PELVIC;  Surgeon: Raynelle Bring, MD;  Location: [facility];  Service: Urology;  Laterality: Bilateral;   PILONIDAL CYST EXCISION     PROSTATE SURGERY N/A    [employer] 08/26/2020   ROBOT ASSISTED LAPAROSCOPIC RADICAL PROSTATECTOMY N/A 01/10/2020   Procedure: XI ROBOTIC ASSISTED LAPAROSCOPIC RADICAL PROSTATECTOMY LEVEL 2;  Surgeon: Raynelle Bring, MD;  Location: [facility];  Service: Urology;  Laterality: N/A;   VASECTOMY     Allergies  Allergen Reactions   Penicillins Rash    Teen Years   Prior to Admission medications   Medication Sig Start Date End Date Taking? Authorizing Provider  acetaminophen (TYLENOL) 500 MG tablet Take 500 mg by mouth every 6 (six) hours as needed for mild pain or headache.    Yes [provider]  aspirin EC 81 MG tablet Take 81 mg by mouth daily. Swallow whole.   Yes [provider]  cetirizine (ZYRTEC) 10 MG tablet Take 1 tablet (10 mg total) by mouth daily. Patient taking differently: Take 10 mg by mouth at bedtime. 12/10/16  Yes McVey,  Gelene Mink, PA-C  fluticasone (FLONASE) 50 MCG/ACT nasal spray Place 2 sprays into both nostrils daily. 12/10/16  Yes McVey, Gelene Mink, PA-C  ipratropium (ATROVENT) 0.06 % nasal spray Place 1-2 sprays into both nostrils 4 (four) times daily. As needed for nasal congestion 08/17/21  Yes Wendie Agreste, MD  montelukast (SINGULAIR) 10 MG tablet TAKE 1 TABLET BY MOUTH EVERYDAY AT BEDTIME 05/11/21  Yes Wendie Agreste, MD  azithromycin (ZITHROMAX) 250 MG tablet Take 2 pills by mouth on day 1, then 1 pill by mouth per day on days 2 through 5. Patient not taking: Reported on 10/01/2021 08/17/21   Wendie Agreste, MD   Social History   Socioeconomic History   Marital status: Married    Spouse name: Not on file   Number of children: Not on file   Years of education: Not on file   Highest education level: Not on file  Occupational History   Occupation: minister    Employer: [employer]  Tobacco Use   Smoking status: Former    Types: Cigarettes    Quit date: 2002    Years since quitting: 21.1   Smokeless tobacco: Never  Vaping Use   Vaping Use: Never used  Substance and Sexual Activity   Alcohol use: Yes    Alcohol/week: 14.0 standard drinks    Types: 14 Cans of beer per week    Comment: 1-2 beers daily now hes retired   Drug use: No   Sexual activity: Yes    Birth control/protection: Other-see comments    Comment: vasectomy  Other Topics Concern   Not on file  Social History Narrative   Marital status: married x 26 years.      Children: 3 children (23, 21, 16); no grandchildren.      Lives: with wife, 1 child at home       Employment: [employer] x 13 years.      Tobacco: quit smoking 15 years ago.      Alcohol: rarely      Exercise:  3 times per week (weights, cardio).      Seatbelt: 100%      Guns: none   Social Determinants of Radio broadcast assistant Strain: Not  on file  Food Insecurity: Not on file  Transportation  Needs: Not on file  Physical Activity: Not on file  Stress: Not on file  Social Connections: Not on file  Intimate Partner Violence: Not on file    Review of Systems  13 point review of systems per patient health survey noted.  Negative other than as indicated above or in HPI.   Objective:   Vitals:   10/01/21 0805  BP: 124/76  Pulse: 76  Resp: 15  Temp: 97.9 F (36.6 C)  TempSrc: Temporal  SpO2: 98%  Weight: 246 lb 9.6 oz (111.9 kg)  Height: 6' (1.829 m)     Physical Exam Vitals reviewed.  Constitutional:      Appearance: He is well-developed.  HENT:     Head: Normocephalic and atraumatic.     Right Ear: External ear normal.     Left Ear: External ear normal.     Ears:     Comments: Moderate cerumen bilat, not complete obstruction.  Eyes:     Conjunctiva/sclera: Conjunctivae normal.     Pupils: Pupils are equal, round, and reactive to light.  Neck:     Thyroid: No thyromegaly.  Cardiovascular:     Rate and Rhythm: Normal rate and regular rhythm.     Heart sounds: Normal heart sounds.  Pulmonary:     Effort: Pulmonary effort is normal. No respiratory distress.     Breath sounds: Normal breath sounds. No wheezing.  Abdominal:     General: There is no distension.     Palpations: Abdomen is soft.     Tenderness: There is no abdominal tenderness.  Musculoskeletal:        General: No tenderness. Normal range of motion.     Cervical back: Normal range of motion and neck supple.  Lymphadenopathy:     Cervical: No cervical adenopathy.  Skin:    General: Skin is warm and dry.  Neurological:     Mental Status: He is alert and oriented to person, place, and time.     Deep Tendon Reflexes: Reflexes are normal and symmetric.  Psychiatric:        Behavior: Behavior normal.    EKG, sinus rhythm, rate 67.  No apparent acute changes but possible change in R wave progression.    Assessment & Plan:  George Garner is a 66 y.o. male . Welcome to Medicare preventive  visit  - - anticipatory guidance as below in AVS, screening labs if needed. Health maintenance items as above in HPI discussed/recommended as applicable.  - no concerning responses on depression, fall, or functional status screening. Any positive responses noted as above. Advanced directives discussed as in [facility].   Allergic rhinitis, unspecified seasonality, unspecified trigger - Plan: montelukast (SINGULAIR) 10 MG tablet  -Stable on Zyrtec, Singulair, Flonase, continue same.  Dyslipidemia - Plan: Lipid panel, Comprehensive metabolic panel, EKG [id number]  -With weight gain anticipate higher readings.  Discussed statin, option of coronary calcium scoring.  Labs pending.  Continue work on diet/exercise.  Commended on recent changes.  Screening for diabetes mellitus - Plan: Comprehensive metabolic panel, Hemoglobin A1c Hyperglycemia - Plan: Comprehensive metabolic panel, Hemoglobin A1c  -Weight gain as above, check A1c  Need for pneumococcal vaccination - Plan: Pneumococcal polysaccharide vaccine 23-valent greater than or equal to 2yo subcutaneous/IM  Tinea versicolor - Plan: ketoconazole (NIZORAL) 200 MG tablet  -Episodic recurrence during warmer weather, refilled Nizoral, use has been discussed, refill x1 if needed later in the summer with  RTC precautions.  Screening for cardiovascular condition - Plan: EKG 12-Lead  Hearing difficulty  - follow up with audiologist, may need cerumen lavage prior.   I also recommended follow-up with sleep specialist regarding early awakening, fatigue, follow-up if persistent fatigue with normal sleep.  Abnormal EKG, poor R wave progression noted, appears changed from his 2020 EKG.  Will refer to cardiology, asymptomatic at present. RTC/ER precautions.   Meds ordered this encounter  Medications   montelukast (SINGULAIR) 10 MG tablet    Sig: Take 1 tablet (10 mg total) by mouth daily.    Dispense:  90 tablet    Refill:  3   ketoconazole (NIZORAL) 200 MG  tablet    Sig: Take 2 tablets (400 mg total) by mouth daily.    Dispense:  2 tablet    Refill:  1   Patient Instructions  Nizoral for tinea versicolor. Refill provided if recurs later in summer.   I will recheck cholesterol. Keep up the good work with exercise and watching calories.  We have option of coronary calcium scoring to help decide on statin medicine.   Contact sleep specialist about sleep issues. Let me know if I can help.   I do recommend updated Covid booster at your pharmacy.   I recommend following up with your audiologist about difficulty with hearing aids. Let me know if a new referral needed. There was some cerumen in each ear canal - we can remove here if you would like prior to audiology visit.   Call eye specialist for appointment.    Thanks for coming in today.   Preventive Care 29 Years and Older, Male Preventive care refers to lifestyle choices and visits with your health care provider that can promote health and wellness. Preventive care visits are also called wellness exams. What can I expect for my preventive care visit? Counseling During your preventive care visit, your health care provider may ask about your: Medical history, including: Past medical problems. Family medical history. History of falls. Current health, including: Emotional well-being. Home life and relationship well-being. Sexual activity. Memory and ability to understand (cognition). Lifestyle, including: Alcohol, nicotine or tobacco, and drug use. Access to firearms. Diet, exercise, and sleep habits. Work and work Statistician. Sunscreen use. Safety issues such as seatbelt and bike helmet use. Physical exam Your health care provider will check your: Height and weight. These may be used to calculate your BMI (body mass index). BMI is a measurement that tells if you are at a healthy weight. Waist circumference. This measures the distance around your waistline. This measurement  also tells if you are at a healthy weight and may help predict your risk of certain diseases, such as type 2 diabetes and high blood pressure. Heart rate and blood pressure. Body temperature. Skin for abnormal spots. What immunizations do I need? Vaccines are usually given at various ages, according to a schedule. Your health care provider will recommend vaccines for you based on your age, medical history, and lifestyle or other factors, such as travel or where you work. What tests do I need? Screening Your health care provider may recommend screening tests for certain conditions. This may include: Lipid and cholesterol levels. Diabetes screening. This is done by checking your blood sugar (glucose) after you have not eaten for a while (fasting). Hepatitis C test. Hepatitis B test. HIV (human immunodeficiency virus) test. STI (sexually transmitted infection) testing, if you are at risk. Lung cancer screening. Colorectal cancer screening. Prostate cancer screening. Abdominal aortic  aneurysm (AAA) screening. You may need this if you are a current or former smoker. Talk with your health care provider about your test results, treatment options, and if necessary, the need for more tests. Follow these instructions at home: Eating and drinking  Eat a diet that includes fresh fruits and vegetables, whole grains, lean protein, and low-fat dairy products. Limit your intake of foods with high amounts of sugar, saturated fats, and salt. Take vitamin and mineral supplements as recommended by your health care provider. Do not drink alcohol if your health care provider tells you not to drink. If you drink alcohol: Limit how much you have to 0-2 drinks a day. Know how much alcohol is in your drink. In the [place] one drink equals one 12 oz bottle of beer (355 mL), one 5 oz glass of wine (148 mL), or one 1 oz glass of hard liquor (44 mL). Lifestyle Brush your teeth every morning and night with fluoride  toothpaste. Floss one time each day. Exercise for at least 30 minutes 5 or more days each week. Do not use any products that contain nicotine or tobacco. These products include cigarettes, chewing tobacco, and vaping devices, such as e-cigarettes. If you need help quitting, ask your health care provider. Do not use drugs. If you are sexually active, practice safe sex. Use a condom or other form of protection to prevent STIs. Take aspirin only as told by your health care provider. Make sure that you understand how much to take and what form to take. Work with your health care provider to find out whether it is safe and beneficial for you to take aspirin daily. Ask your health care provider if you need to take a cholesterol-lowering medicine (statin). Find healthy ways to manage stress, such as: Meditation, yoga, or listening to music. Journaling. Talking to a trusted person. Spending time with friends and family. Safety Always wear your seat belt while driving or riding in a vehicle. Do not drive: If you have been drinking alcohol. Do not ride with someone who has been drinking. When you are tired or distracted. While texting. If you have been using any mind-altering substances or drugs. Wear a helmet and other protective equipment during sports activities. If you have firearms in your house, make sure you follow all gun safety procedures. Minimize exposure to UV radiation to reduce your risk of skin cancer. What's next? Visit your health care provider once a year for an annual wellness visit. Ask your health care provider how often you should have your eyes and teeth checked. Stay up to date on all vaccines. This information is not intended to replace advice given to you by your health care provider. Make sure you discuss any questions you have with your health care provider. Document Revised: 01/14/2021 Document Reviewed: 01/14/2021 Elsevier Patient Education  2022 [place],   Merri Ray, MD [place], [facility] 10/01/21 9:02 AM

## 2021-10-01 NOTE — Patient Instructions (Addendum)
Nizoral for tinea versicolor. Refill provided if recurs later in summer.  ? ?I will recheck cholesterol. Keep up the good work with exercise and watching calories.  ?We have option of coronary calcium scoring to help decide on statin medicine.  ? ?Contact sleep specialist about sleep issues. Let me know if I can help.  ? ?I do recommend updated Covid booster at your pharmacy.  ? ?I recommend following up with your audiologist about difficulty with hearing aids. Let me know if a new referral needed. There was some cerumen in each ear canal - we can remove here if you would like prior to audiology visit.  ? ?Call eye specialist for appointment.  ? ?Overall the EKG from today was similar to your one in 2020 but there was a slight change in something called R wave progression which can sometimes be a sign for underlying heart issues.  I would like you to meet with cardiology to review this EKG and decide if any other testing needed.  I will place that referral. Let me know if you have questions. ? ?Thanks for coming in today.  ? ?Preventive Care 19 Years and Older, Male ?Preventive care refers to lifestyle choices and visits with your health care provider that can promote health and wellness. Preventive care visits are also called wellness exams. ?What can I expect for my preventive care visit? ?Counseling ?During your preventive care visit, your health care provider may ask about your: ?Medical history, including: ?Past medical problems. ?Family medical history. ?History of falls. ?Current health, including: ?Emotional well-being. ?Home life and relationship well-being. ?Sexual activity. ?Memory and ability to understand (cognition). ?Lifestyle, including: ?Alcohol, nicotine or tobacco, and drug use. ?Access to firearms. ?Diet, exercise, and sleep habits. ?Work and work Statistician. ?Sunscreen use. ?Safety issues such as seatbelt and bike helmet use. ?Physical exam ?Your health care provider will check your: ?Height  and weight. These may be used to calculate your BMI (body mass index). BMI is a measurement that tells if you are at a healthy weight. ?Waist circumference. This measures the distance around your waistline. This measurement also tells if you are at a healthy weight and may help predict your risk of certain diseases, such as type 2 diabetes and high blood pressure. ?Heart rate and blood pressure. ?Body temperature. ?Skin for abnormal spots. ?What immunizations do I need? ?Vaccines are usually given at various ages, according to a schedule. Your health care provider will recommend vaccines for you based on your age, medical history, and lifestyle or other factors, such as travel or where you work. ?What tests do I need? ?Screening ?Your health care provider may recommend screening tests for certain conditions. This may include: ?Lipid and cholesterol levels. ?Diabetes screening. This is done by checking your blood sugar (glucose) after you have not eaten for a while (fasting). ?Hepatitis C test. ?Hepatitis B test. ?HIV (human immunodeficiency virus) test. ?STI (sexually transmitted infection) testing, if you are at risk. ?Lung cancer screening. ?Colorectal cancer screening. ?Prostate cancer screening. ?Abdominal aortic aneurysm (AAA) screening. You may need this if you are a current or former smoker. ?Talk with your health care provider about your test results, treatment options, and if necessary, the need for more tests. ?Follow these instructions at home: ?Eating and drinking ? ?Eat a diet that includes fresh fruits and vegetables, whole grains, lean protein, and low-fat dairy products. Limit your intake of foods with high amounts of sugar, saturated fats, and salt. ?Take vitamin and mineral supplements as recommended  by your health care provider. ?Do not drink alcohol if your health care provider tells you not to drink. ?If you drink alcohol: ?Limit how much you have to 0-2 drinks a day. ?Know how much alcohol is  in your drink. In the [place] one drink equals one 12 oz bottle of beer (355 mL), one 5 oz glass of wine (148 mL), or one 1? oz glass of hard liquor (44 mL). ?Lifestyle ?Brush your teeth every morning and night with fluoride toothpaste. Floss one time each day. ?Exercise for at least 30 minutes 5 or more days each week. ?Do not use any products that contain nicotine or tobacco. These products include cigarettes, chewing tobacco, and vaping devices, such as e-cigarettes. If you need help quitting, ask your health care provider. ?Do not use drugs. ?If you are sexually active, practice safe sex. Use a condom or other form of protection to prevent STIs. ?Take aspirin only as told by your health care provider. Make sure that you understand how much to take and what form to take. Work with your health care provider to find out whether it is safe and beneficial for you to take aspirin daily. ?Ask your health care provider if you need to take a cholesterol-lowering medicine (statin). ?Find healthy ways to manage stress, such as: ?Meditation, yoga, or listening to music. ?Journaling. ?Talking to a trusted person. ?Spending time with friends and family. ?Safety ?Always wear your seat belt while driving or riding in a vehicle. ?Do not drive: ?If you have been drinking alcohol. Do not ride with someone who has been drinking. ?When you are tired or distracted. ?While texting. ?If you have been using any mind-altering substances or drugs. ?Wear a helmet and other protective equipment during sports activities. ?If you have firearms in your house, make sure you follow all gun safety procedures. ?Minimize exposure to UV radiation to reduce your risk of skin cancer. ?What's next? ?Visit your health care provider once a year for an annual wellness visit. ?Ask your health care provider how often you should have your eyes and teeth checked. ?Stay up to date on all vaccines. ?This information is not intended to replace advice given to you  by your health care provider. Make sure you discuss any questions you have with your health care provider. ?Document Revised: 01/14/2021 Document Reviewed: 01/14/2021 ?Elsevier Patient Education ? 2022 [employer]. ? ? ? ? ? ? ?

## 2021-10-02 ENCOUNTER — Telehealth: Payer: Self-pay

## 2021-10-02 ENCOUNTER — Encounter: Payer: Self-pay | Admitting: Family Medicine

## 2021-10-02 DIAGNOSIS — G4733 Obstructive sleep apnea (adult) (pediatric): Secondary | ICD-10-CM

## 2021-10-02 NOTE — Telephone Encounter (Signed)
Received notice from [facility] that Ketoconazole tablets 200 mg are not covered by his insurance. PA will be started today for his as needed dosing ?

## 2021-10-02 NOTE — Telephone Encounter (Signed)
Called insurance but was stopped as I do not have current insurance card, attempted call to patient no answer and VM is full  ?

## 2021-10-02 NOTE — Telephone Encounter (Signed)
Okay to send referral. 

## 2021-10-04 NOTE — Telephone Encounter (Signed)
Referral ordered

## 2021-10-26 NOTE — Progress Notes (Signed)
?Cardiology Office Note:   ? ?Date:  10/28/2021  ? ?ID:  George Garner, DOB 1956-01-01, MRN [id number] ? ?PCP:  Wendie Agreste, MD  ?Cardiologist:  Buford Dresser, MD ? ?Referring MD: Wendie Agreste, MD  ? ?CC: new patient evaluation for abnormal ECG ? ?History of Present Illness:   ? ?George Garner is a 66 y.o. male with a hx of hyperlipidemia, GERD, OSA on CPAP, and BPH, who is seen as a new consult at the request of Wendie Agreste, MD for the evaluation and management of abnormal EKG. ? ?Notes from 10/01/2021 visit with Dr. Carlota Raspberry reviewed. EKG at that visit showed sinus rhythm, rate 67 bpm, no apparent acute changes but possible change in R wave progression since his EKG in 2020. He was asymptomatic. ? ?Cardiovascular risk factors: ?Prior clinical ASCVD: None. ?Comorbid conditions: He endorses borderline hypertension, never too high. He does snore, and currently uses a CPAP. However, every night he will wake up after 6 hours to use the restroom, and is unable to fall back asleep. He believes this may be contributing to his higher blood pressures today.Hyperlipidemia - Was managing with diet/exercise. ?Metabolic syndrome/Obesity: Highest adult weight 262 lbs. ?Chronic inflammatory conditions: None ?Tobacco use history: Former smoker, he quit in 2000. He was smoking 1 ppd since 1974. ?Family history: His father had a mild heart attack, currently 93 yo. His mother lived to 39 yo, had diabetes and died of suspected Alzheimer's. His maternal uncle and grandfather had angina and died in their early 57s. ?Prior cardiac testing and/or incidental findings on other testing (ie coronary calcium): Several years ago he presented with dizziness, sent for CT scan. He notes that his dizziness has returned, worse in the morning and when looking up. Usually feels more lightheaded than dizzy. Most of the time it is not a problem, but occasionally worsens such as recently. ?Exercise level: Very active, usually with  yard work and some walking.  ?Current diet: Typically eats healthy, but notes that he eats too late at night. Uses olive oil when cooking. He drinks "a lot" of coffee, black and half decaf. Previously tried a [employer], but this made them feel fatigued. He states his biggest downfall is ice cream. ? ?He denies any palpitations, chest pain, shortness of breath, or peripheral edema. No headaches, syncope, orthopnea, or PND. ? ?Of note, he had a prostatectomy in 01/2020. No hematuria since then. ? ?Past Medical History:  ?Diagnosis Date  ? Allergy   ? Zyrtec daily.  Flonase PRN.  ? BPH (benign prostatic hyperplasia)   ? Dyslipidemia   ? Erectile dysfunction   ? GERD (gastroesophageal reflux disease)   ? Hyperlipidemia   ? Hypogonadism male   ? Obstructive sleep apnea   ? OSA on CPAP   ? Sleep apnea   ? ? ?Past Surgical History:  ?Procedure Laterality Date  ? CHOLECYSTECTOMY    ? COLONOSCOPY    ? deviated septum-cyst    ? EYE SURGERY    ? L eye tumor benign.  ? GALLBLADDER SURGERY    ? INGUINAL HERNIA REPAIR N/A 01/10/2020  ? Procedure: HERNIA REPAIR UMBILICAL, ADULT;  Surgeon: Raynelle Bring, MD;  Location: [facility];  Service: Urology;  Laterality: N/A;  ? LYMPHADENECTOMY Bilateral 01/10/2020  ? Procedure: LYMPHADENECTOMY, PELVIC;  Surgeon: Raynelle Bring, MD;  Location: [facility];  Service: Urology;  Laterality: Bilateral;  ? PILONIDAL CYST EXCISION    ? PROSTATE SURGERY N/A   ? [employer] 08/26/2020  ?  ROBOT ASSISTED LAPAROSCOPIC RADICAL PROSTATECTOMY N/A 01/10/2020  ? Procedure: XI ROBOTIC ASSISTED LAPAROSCOPIC RADICAL PROSTATECTOMY LEVEL 2;  Surgeon: Raynelle Bring, MD;  Location: [facility];  Service: Urology;  Laterality: N/A;  ? VASECTOMY    ? ? ?Current Medications: ?Current Outpatient Medications on File Prior to Visit  ?Medication Sig  ? acetaminophen (TYLENOL) 500 MG tablet Take 500 mg by mouth every 6 (six) hours as needed for mild pain or headache.   ? aspirin EC 81 MG tablet Take 81 mg by mouth daily. Swallow whole.  ?  cetirizine (ZYRTEC) 10 MG tablet Take 1 tablet (10 mg total) by mouth daily. (Patient taking differently: Take 10 mg by mouth at bedtime.)  ? fluticasone (FLONASE) 50 MCG/ACT nasal spray Place 2 sprays into both nostrils daily.  ? ipratropium (ATROVENT) 0.06 % nasal spray Place 1-2 sprays into both nostrils 4 (four) times daily. As needed for nasal congestion  ? montelukast (SINGULAIR) 10 MG tablet Take 1 tablet (10 mg total) by mouth daily.  ? ?No current facility-administered medications on file prior to visit.  ?  ? ?Allergies:   Penicillins  ? ?Social History  ? ?Tobacco Use  ? Smoking status: Former  ?  Types: Cigarettes  ?  Quit date: 2002  ?  Years since quitting: 21.2  ? Smokeless tobacco: Never  ?Vaping Use  ? Vaping Use: Never used  ?Substance Use Topics  ? Alcohol use: Yes  ?  Alcohol/week: 14.0 standard drinks  ?  Types: 14 Cans of beer per week  ?  Comment: 1-2 beers daily now hes retired  ? Drug use: No  ? ? ?Family History: ?family history includes Arthritis in his brother; Dementia in his mother; Diabetes in his mother. There is no history of Colon cancer, Colon polyps, Esophageal cancer, Stomach cancer, or Rectal cancer. ? ?ROS:   ?Please see the history of present illness.  Additional pertinent ROS: ?Constitutional: Negative for chills, fever, night sweats, unintentional weight loss.   ?HENT: Negative for ear pain and hearing loss.   ?Eyes: Negative for loss of vision and eye pain.  ?Respiratory: Negative for cough, sputum, wheezing.   ?Cardiovascular: See HPI. ?Gastrointestinal: Negative for abdominal pain, melena, and hematochezia.  ?Genitourinary: Negative for dysuria and hematuria.  ?Musculoskeletal: Negative for falls and myalgias.  ?Skin: Negative for itching and rash.  ?Neurological: Negative for focal weakness, focal sensory changes and loss of consciousness. Positive for lightheadedness, dizziness. ?Endo/Heme/Allergies: Does not bruise/bleed easily.   ? ? ?EKGs/Labs/Other Studies Reviewed:    ? ?The following studies were reviewed today: ? ?Bilateral Carotid Dopplers 02/06/2019: ?FINDINGS: ?Criteria: Quantification of carotid stenosis is based on velocity ?parameters that correlate the residual internal carotid diameter ?with NASCET-based stenosis levels, using the diameter of the distal ?internal carotid lumen as the denominator for stenosis measurement. ?  ?The following velocity measurements were obtained: ?  ?RIGHT ?ICA: 92/31 cm/sec ?CCA: 129/22 cm/sec ?  ?SYSTOLIC ICA/CCA RATIO:  0.7 ?  ?ECA:  138 cm/sec ?  ?LEFT ?  ?ICA: 98/24 cm/sec ?  ?CCA: 129/24 cm/sec ?  ?SYSTOLIC ICA/CCA RATIO:  0.8 ?  ?ECA:  126 cm/sec ?  ?RIGHT CAROTID ARTERY: No significant atherosclerotic plaque or ?evidence of stenosis. ?  ?RIGHT VERTEBRAL ARTERY:  Patent with normal antegrade flow. ?  ?LEFT CAROTID ARTERY: No significant atherosclerotic plaque or ?evidence of stenosis. ?  ?LEFT VERTEBRAL ARTERY:  Patent with normal antegrade flow. ?  ?IMPRESSION: ?Negative bilateral carotid duplex. No significant atherosclerotic ?plaque or evidence  of internal carotid artery stenosis. ?  ? ?EKG:  EKG is personally reviewed.   ?10/28/2021: NSR at 60 bpm with normal R wave progression, only Q in III ? ?Recent Labs: ?10/01/2021: ALT 27; BUN 21; Creatinine, Ser 1.22; Potassium 4.2; Sodium 138  ? ?Recent Lipid Panel ?   ?Component Value Date/Time  ? CHOL 207 (H) 10/01/2021 0936  ? CHOL 201 (H) 08/29/2020 1159  ? TRIG 158.0 (H) 10/01/2021 0936  ? HDL 43.70 10/01/2021 0936  ? HDL 43 08/29/2020 1159  ? CHOLHDL 5 10/01/2021 0936  ? VLDL 31.6 10/01/2021 0936  ? [place] 132 (H) 10/01/2021 0936  ? LDLCALC 143 (H) 08/29/2020 1159  ? ? ?Physical Exam:   ? ?VS:  BP (!) 148/80 (BP Location: Left Arm, Patient Position: Sitting, Cuff Size: Large)   Pulse 60   Ht 6' (1.829 m)   Wt 250 lb (113.4 kg)   BMI 33.91 kg/m?    ? ?Wt Readings from Last 3 Encounters:  ?10/28/21 250 lb (113.4 kg)  ?10/01/21 246 lb 9.6 oz (111.9 kg)  ?08/17/21 244 lb 6.4 oz (110.9  kg)  ?  ?GEN: Well nourished, well developed in no acute distress ?HEENT: Normal, moist mucous membranes ?NECK: No JVD ?CARDIAC: regular rhythm, normal S1 and S2, no rubs or gallops. No murmur. ?VASCULAR: R

## 2021-10-28 ENCOUNTER — Other Ambulatory Visit: Payer: Self-pay

## 2021-10-28 ENCOUNTER — Ambulatory Visit (INDEPENDENT_AMBULATORY_CARE_PROVIDER_SITE_OTHER): Payer: Medicare Other | Admitting: Cardiology

## 2021-10-28 ENCOUNTER — Encounter (HOSPITAL_BASED_OUTPATIENT_CLINIC_OR_DEPARTMENT_OTHER): Payer: Self-pay | Admitting: Cardiology

## 2021-10-28 VITALS — BP 148/80 | HR 60 | Ht 72.0 in | Wt 250.0 lb

## 2021-10-28 DIAGNOSIS — G4733 Obstructive sleep apnea (adult) (pediatric): Secondary | ICD-10-CM

## 2021-10-28 DIAGNOSIS — E8881 Metabolic syndrome: Secondary | ICD-10-CM

## 2021-10-28 DIAGNOSIS — Z9189 Other specified personal risk factors, not elsewhere classified: Secondary | ICD-10-CM

## 2021-10-28 DIAGNOSIS — Z7189 Other specified counseling: Secondary | ICD-10-CM

## 2021-10-28 DIAGNOSIS — R030 Elevated blood-pressure reading, without diagnosis of hypertension: Secondary | ICD-10-CM | POA: Diagnosis not present

## 2021-10-28 NOTE — Patient Instructions (Addendum)
Medication Instructions:  ?Your Physician recommend you continue on your current medication as directed.   ? ?*If you need a refill on your cardiac medications before your next appointment, please call your pharmacy* ? ? ?Lab Work: ?None ordered today ? ? ?Testing/Procedures: ?CT coronary calcium score.  ? ?Test locations:  ?[facility] ([address] [place], [place] [address]) ?[facility] ([address] [place], [place] [address])  ? ?This is $99 out of pocket. ? ? ?Coronary CalciumScan ?A coronary calcium scan is an imaging test used to look for deposits of calcium and other fatty materials (plaques) in the inner lining of the blood vessels of the heart (coronary arteries). These deposits of calcium and plaques can partly clog and narrow the coronary arteries without producing any symptoms or warning signs. This puts a person at risk for a heart attack. This test can detect these deposits before symptoms develop. ?Tell a health care provider about: ?Any allergies you have. ?All medicines you are taking, including vitamins, herbs, eye drops, creams, and over-the-counter medicines. ?Any problems you or family members have had with anesthetic medicines. ?Any blood disorders you have. ?Any surgeries you have had. ?Any medical conditions you have. ?Whether you are pregnant or may be pregnant. ?What are the risks? ?Generally, this is a safe procedure. However, problems may occur, including: ?Harm to a pregnant woman and her unborn baby. This test involves the use of radiation. Radiation exposure can be dangerous to a pregnant woman and her unborn baby. If you are pregnant, you generally should not have this procedure done. ?Slight increase in the risk of cancer. This is because of the radiation involved in the test. ?What happens before the procedure? ?No preparation is needed for this procedure. ?What happens during the procedure? ?You will undress and remove any jewelry around your neck or  chest. ?You will put on a hospital gown. ?Sticky electrodes will be placed on your chest. The electrodes will be connected to an electrocardiogram (ECG) machine to record a tracing of the electrical activity of your heart. ?A CT scanner will take pictures of your heart. During this time, you will be asked to lie still and hold your breath for 2-3 seconds while a picture of your heart is being taken. ?The procedure may vary among health care providers and hospitals. ?What happens after the procedure? ?You can get dressed. ?You can return to your normal activities. ?It is up to you to get the results of your test. Ask your health care provider, or the department that is doing the test, when your results will be ready. ?Summary ?A coronary calcium scan is an imaging test used to look for deposits of calcium and other fatty materials (plaques) in the inner lining of the blood vessels of the heart (coronary arteries). ?Generally, this is a safe procedure. Tell your health care provider if you are pregnant or may be pregnant. ?No preparation is needed for this procedure. ?A CT scanner will take pictures of your heart. ?You can return to your normal activities after the scan is done. ?This information is not intended to replace advice given to you by your health care provider. Make sure you discuss any questions you have with your health care provider. ?Document Released: 01/15/2008 Document Revised: 06/07/2016 Document Reviewed: 06/07/2016 ?Elsevier Interactive Patient Education ? 2017 Elsevier Inc. ? ? ? ?Follow-Up: ?At [facility], you and your health needs are our priority.  As part of our continuing mission to provide you with exceptional heart  care, we have created designated Provider Care Teams.  These Care Teams include your primary Cardiologist (physician) and Advanced Practice Providers (APPs -  Physician Assistants and Nurse Practitioners) who all work together to provide you with the care you need, when you  need it. ? ?We recommend signing up for the patient portal called "MyChart".  Sign up information is provided on this After Visit Summary.  MyChart is used to connect with patients for Virtual Visits (Telemedicine).  Patients are able to view lab/test results, encounter notes, upcoming appointments, etc.  Non-urgent messages can be sent to your provider as well.   ?To learn more about what you can do with MyChart, go to [email or web address].   ? ?Your next appointment:   ?Based on test results ? ?The format for your next appointment:   ?In Person ? ?Provider:   ?Buford Dresser, MD{ ? ?how to check blood pressure: ? -sit comfortably in a chair, feet uncrossed and flat on floor, for 5-10 minutes ? -arm ideally should rest at the level of the heart. However, arm should be relaxed and not tense (for example, do not hold the arm up unsupported) ? -avoid exercise, caffeine, and tobacco for at least 30 minutes prior to BP reading ? -don't take BP cuff reading over clothes (always place on skin directly) ? -I prefer to know how well the medication is working, so I would like you to take your readings 1-2 hours after taking your blood pressure medication if possible  ? ?If you are going to get a blood pressure cuff, I like the arm cuff better than the wrist cuff. ? ?Check blood pressures 2-3 times/week for the next few weeks and send me a mychart message with numbers. ?

## 2021-11-06 ENCOUNTER — Encounter: Payer: Self-pay | Admitting: Family Medicine

## 2021-11-11 ENCOUNTER — Telehealth (HOSPITAL_BASED_OUTPATIENT_CLINIC_OR_DEPARTMENT_OTHER): Payer: Self-pay | Admitting: Cardiology

## 2021-11-11 NOTE — Telephone Encounter (Signed)
Spoke with patient regarding the 12/22/21 calcium scoring appointment----he states he saw it on My Chart and has the date marked on his calender ?

## 2021-11-23 ENCOUNTER — Telehealth: Payer: Self-pay | Admitting: Neurology

## 2021-11-23 ENCOUNTER — Encounter: Payer: Self-pay | Admitting: Neurology

## 2021-11-23 ENCOUNTER — Ambulatory Visit (INDEPENDENT_AMBULATORY_CARE_PROVIDER_SITE_OTHER): Payer: Medicare Other | Admitting: Neurology

## 2021-11-23 VITALS — BP 166/84 | HR 74 | Ht 72.0 in | Wt 252.0 lb

## 2021-11-23 DIAGNOSIS — R6881 Early satiety: Secondary | ICD-10-CM

## 2021-11-23 DIAGNOSIS — G4733 Obstructive sleep apnea (adult) (pediatric): Secondary | ICD-10-CM | POA: Diagnosis not present

## 2021-11-23 DIAGNOSIS — Z9989 Dependence on other enabling machines and devices: Secondary | ICD-10-CM | POA: Diagnosis not present

## 2021-11-23 DIAGNOSIS — C61 Malignant neoplasm of prostate: Secondary | ICD-10-CM

## 2021-11-23 DIAGNOSIS — Z6834 Body mass index (BMI) 34.0-34.9, adult: Secondary | ICD-10-CM

## 2021-11-23 DIAGNOSIS — R400 Somnolence: Secondary | ICD-10-CM

## 2021-11-23 DIAGNOSIS — E6609 Other obesity due to excess calories: Secondary | ICD-10-CM | POA: Diagnosis not present

## 2021-11-23 DIAGNOSIS — E669 Obesity, unspecified: Secondary | ICD-10-CM

## 2021-11-23 NOTE — Progress Notes (Signed)
?SLEEP MEDICINE CLINIC ? ? ?Provider:  Larey Seat, M D  ?Primary Care Physician:  George Agreste, MD ? ? ?Referring Provider: Wendie Agreste, MD  ? ?Chief Complaint  ?Patient presents with  ? New Patient (Initial Visit)  ?  Pt alone, rm 10. Was established pt here, last seen 2019. Last SS 2019. Pt in need of CPAP supplies. Set up 11/2017. DME [facility] would like to switch DME  ? ? ?HPI:  George Garner is a 66 y.o. male , seen here as in a referral from Dr. Carlota Garner for a CPAP follow up.  ?He was not seen here for over 3 years, is de-facto anew patient.has used CPAP compliantly, wife is happy and he feels rested in AM. Never had trouble to initiate sleep but still has difficulties o stay asleep. He retired January 1st 2023.  ?The patient also reports that he averages about 6 hours of sleep but when he wakes up very early during his night he is often not able to return to sleep, he believes the bed and mother do something productive.  She obviously looks at the clock.  He does report that he has a tendency to fall asleep when physically inactive and not mentally stimulated.  That may be sitting and reading or watching TV or even in a church or waiting room. ?  ?His sleep study was a performed here as a split-night titration on 3-28 2019 that the patient was at that time as well referred by Dr. Cindee Garner.  He already on a CPAP machine that was about a decade old.  He had lost 35 pounds when he felt that he did no longer needed.  He would like to resume CPAP due to excessive daytime sleepiness and snoring again after he regained some weight.  At the time he endorsed the Epworth Sleepiness Scale at 17 points FSS at 26 point GDS at 2 out of 15 points.  BMI was 34.  His baseline study confirmed an AHI of 25.6/h remarkably not REM sleep dependent apnea in rem sleep there were only 2.3 AHI per hour he avoided supine sleep and only slept about 32 minutes in that position but supine sleep was  associated with an AHI of 80/h. ? ?He had 26 minutes of oxygen desaturation with a nadir at 78%. ? ?The study was split and CPAP initiated at 5 and titrated to 7 cm water on an E son nasal mask and medium size.  Sleep efficiency became much higher on the CPAP AHI was 0.3.  And he had a follow-up visit with nurse practitioner George Garner in October 2019.  He has been using the same machine since and average user time is 6 hours 1 minutes and usually his compliance is between 93 and 97%.  So he is a highly compliant CPAP user there is even a print out provided for whole year and very few days that you use the machine for less than 4 hours.  The average AHI residual is 5.5 events per hour the 95th percentile air leak is 22.6 L/min and over the last 3 weeks he had higher residual AHI than he had before which may be partially due to supply. ? ? ?I have the pleasure of meeting today with George Garner by his own recollection had a sleep study probably between 2007 and 2009 ,  ?He has owned the same CPAP machine ever since, but quit using it after losing 35 pounds or more.  Now he cannot get new supplies, but all symptoms have returned, he is snoring again.  ?He carries a diagnosis of obstructive sleep apnea on CPAP, respiratory allergies, taking Flonase and Zyrtec for those, hyperlipidemia GERD and benign prostate hyperplasia.  He is up-to-date with his influenza vaccinations, received itching shingles vaccine in 2013, has regular PSA scores.   ?Lipid is elevated still cholesterol at 254 total, and he lost was seen at [facility] urgent care following an acute upper respiratory infection in 2018 months, he also had a full physical in January of this year.   ? ?Chief complaint according to patient : " I may need CPAP again"   ? ?Sleep habits are as follows: The patient usually watches TV for the last hour before he retreats to the bedroom, which is around 10 PM.  He has no trouble initiating sleep.  The bedroom is cool, quiet and  dark.  He prefers to sleep on his side, on one firm pillow.  The bed has a flat mattress and is not adjustable.  He usually can sleep for several hours before he is woken by the urge to urinate around 3 AM, he may have 1-3 times  nocturia as each night. ?Rises at 6 .25 AM- he sleeps otherwise through.  ? ? ?Sleep medical history and family sleep history: see above, no childhood sleep disorder, such assleep walking, night terrors.  ? ?Social history:  Now retired [employer] in [place], married, raised Psychologist, forensic-  has 3 children ( aged 42, 32, 49 )He is a former smoker, does not use smokeless tobacco in any form, may drink 1 standard alcoholic beverage per week, caffeine use fairly is fairly high he states, coffee in the morning but never later than lunch time.  ?Hobby: gardening.  ? ? ? ?Review of Systems: ?Out of a complete 14 system review, the patient complains of only the following symptoms, and all other reviewed systems are negative. ?Sleepy when not physically active or mentally stimulated, sleepy after meals.  ?Stastus post prostrate surgery- 1-2 nocturia.  ?Snores when not using CPAP, wife wants him to continue.  ? ?How likely are you to doze in the following situations: ?0 = not likely, 1 = slight chance, 2 = moderate chance, 3 = high chance ? ?Sitting and Reading? ?Watching Television? ?Sitting inactive in a public place (theater or meeting)? ?Lying down in the afternoon when circumstances permit? ?Sitting and talking to someone? ?Sitting quietly after lunch without alcohol? ?In a car, while stopped for a few minutes in traffic? ?As a passenger in a car for an hour without a break? ? ?Total = ? ? ?Epworth score  13/ 24  , Fatigue severity score 19/ 63  , depression score 2/ 15  ? ? ?Social History  ? ?Socioeconomic History  ? Marital status: Married  ?  Spouse name: Not on file  ? Number of children: Not on file  ? Years of education: Not on file  ? Highest education level: Not on file   ?Occupational History  ? Occupation: minister  ?  Employer: [employer]  ?Tobacco Use  ? Smoking status: Former  ?  Types: Cigarettes  ?  Quit date: 2002  ?  Years since quitting: 21.3  ? Smokeless tobacco: Never  ?Vaping Use  ? Vaping Use: Never used  ?Substance and Sexual Activity  ? Alcohol use: Yes  ?  Alcohol/week: 14.0 standard drinks  ?  Types: 14 Cans of beer per week  ?  Comment: 1-2 beers daily now hes retired  ? Drug use: No  ? Sexual activity: Yes  ?  Birth control/protection: Other-see comments  ?  Comment: vasectomy  ?Other Topics Concern  ? Not on file  ?Social History Narrative  ? Marital status: married x 26 years.  ?    Children: 3 children (23, 21, 16); no grandchildren.  ?    Lives: with wife, 1 child at home   ?    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  ?    Tobacco: quit smoking 15 years ago.  ?    Alcohol: rarely  ?    Exercise:  3 times per week (weights, cardio).  ?    Seatbelt: 100%  ?    Guns: none  ? ?Social Determinants of Health  ? ?Financial Resource Strain: Low Risk   ? Difficulty of Paying Living Expenses: Not hard at all  ?Food Insecurity: No Food Insecurity  ? Worried About Charity fundraiser in the Last Year: Never true  ? Ran Out of Food in the Last Year: Never true  ?Transportation Needs: No Transportation Needs  ? Lack of Transportation (Medical): No  ? Lack of Transportation (Non-Medical): No  ?Physical Activity: Insufficiently Active  ? Days of Exercise per Week: 4 days  ? Minutes of Exercise per Session: 30 min  ?Stress: Not on file  ?Social Connections: Not on file  ?Intimate Partner Violence: Not on file  ? ? ?Family History  ?Problem Relation Age of Onset  ? Diabetes Mother   ? Dementia Mother   ? Arthritis Brother   ? Colon cancer Neg Hx   ? Colon polyps Neg Hx   ? Esophageal cancer Neg Hx   ? Stomach cancer Neg Hx   ? Rectal cancer Neg Hx   ? ? ?Past Medical History:  ?Diagnosis Date  ? Allergy   ? Zyrtec daily.  Flonase PRN.  ? BPH  (benign prostatic hyperplasia)   ? Dyslipidemia   ? Erectile dysfunction   ? GERD (gastroesophageal reflux disease)   ? Hyperlipidemia   ? Hypogonadism male   ? Obstructive sleep apnea   ? OSA on CPAP   ? Sleep apnea

## 2021-11-23 NOTE — Patient Instructions (Signed)
Screening for Sleep Apnea  Sleep apnea is a condition in which breathing pauses or becomes shallow during sleep. Sleep apnea screening is a test to determine if you are at risk for sleep apnea. The test includes a series of questions. It will only takes a few minutes. Your health care provider may ask you to have this test in preparation for surgery or as part of a physical exam. What are the symptoms of sleep apnea? Common symptoms of sleep apnea include: Snoring. Waking up often at night. Daytime sleepiness. Pauses in breathing. Choking or gasping during sleep. Irritability. Forgetfulness. Trouble thinking clearly. Depression. Personality changes. Most people with sleep apnea do not know that they have it. What are the advantages of sleep apnea screening? Getting screened for sleep apnea can help: Ensure your safety. It is important for your health care providers to know whether or not you have sleep apnea, especially if you are having surgery or have other long-term (chronic) health conditions. Improve your health and allow you to get a better night's rest. Restful sleep can help you: Have more energy. Lose weight. Improve high blood pressure. Improve diabetes management. Prevent stroke. Prevent car accidents. What happens during the screening? Screening usually includes being asked a list of questions about your sleep quality. Some questions you may be asked include: Do you snore? Is your sleep restless? Do you have daytime sleepiness? Has a partner or spouse told you that you stop breathing during sleep? Have you had trouble concentrating or memory loss? What is your age? What is your neck circumference? To measure your neck, keep your back straight and gently wrap the tape measure around your neck. Put the tape measure at the middle of your neck, between your chin and collarbone. What is your sex assigned at birth? Do you have or are you being treated for high blood  pressure? If your screening test is positive, you are at risk for the condition. Further testing may be needed to confirm a diagnosis of sleep apnea. Where to find more information You can find screening tools online or at your health care clinic. For more information about sleep apnea screening and healthy sleep, visit these websites: Centers for Disease Control and Prevention: www.cdc.gov American Sleep Apnea Association: www.sleepapnea.org Contact a health care provider if: You think that you may have sleep apnea. Summary Sleep apnea screening can help determine if you are at risk for sleep apnea. It is important for your health care providers to know whether or not you have sleep apnea, especially if you are having surgery or have other chronic health conditions. You may be asked to take a screening test for sleep apnea in preparation for surgery or as part of a physical exam. This information is not intended to replace advice given to you by your health care provider. Make sure you discuss any questions you have with your health care provider. Document Revised: 06/27/2020 Document Reviewed: 06/27/2020 Elsevier Patient Education  2023 Elsevier Inc.  

## 2021-11-23 NOTE — Telephone Encounter (Signed)
Called the pt back. Advised that since he has not been seen in over 3 yrs we would have to have a visit first before ordering supplies. Advised that I had a cancellation for today at 1 pm check in by 12:45. Pt accepted. ? ?Called the pt back to advise to being CPAP machine or card to DL since his data is not available. When I made this call there was no answer and VM was full.  ? ?**If pt calls back please advise to bring machine to the appt so we can get a DL.  ? ?

## 2021-11-23 NOTE — Telephone Encounter (Signed)
Pt states he contacted DME [facility] to get a new mask for CPAP.  ?DME told him he needed new prescription from [facility] office for CPAP machine.  ?[phone] ?

## 2021-11-24 NOTE — Progress Notes (Signed)
Pt records faxed to [facility] for TOC, along with new order.  ? ?Fax confirmation received. [phone]  ?

## 2021-12-03 ENCOUNTER — Encounter: Payer: Self-pay | Admitting: Family Medicine

## 2021-12-08 ENCOUNTER — Institutional Professional Consult (permissible substitution): Payer: BC Managed Care – PPO | Admitting: Neurology

## 2021-12-10 ENCOUNTER — Telehealth: Payer: Self-pay | Admitting: Neurology

## 2021-12-10 NOTE — Telephone Encounter (Signed)
Pt is at [facility] and is being told Dr Brett Fairy needs to sign off on order, please call pt ?

## 2021-12-10 NOTE — Telephone Encounter (Signed)
Pt is at [facility] and they told the phone staff they did receive my fax that was sent. Order has been sent ?

## 2021-12-22 ENCOUNTER — Ambulatory Visit (INDEPENDENT_AMBULATORY_CARE_PROVIDER_SITE_OTHER)
Admission: RE | Admit: 2021-12-22 | Discharge: 2021-12-22 | Disposition: A | Discharge status: Home / Self Care | Payer: Self-pay | Source: Ambulatory Visit | Attending: Cardiology | Admitting: Cardiology

## 2021-12-22 DIAGNOSIS — E8881 Metabolic syndrome: Secondary | ICD-10-CM

## 2021-12-25 ENCOUNTER — Other Ambulatory Visit (HOSPITAL_BASED_OUTPATIENT_CLINIC_OR_DEPARTMENT_OTHER): Payer: Self-pay

## 2021-12-25 DIAGNOSIS — Z79899 Other long term (current) drug therapy: Secondary | ICD-10-CM

## 2021-12-25 MED ORDER — ROSUVASTATIN CALCIUM 10 MG PO TABS: 10.0000 mg | ORAL_TABLET | Freq: Every day | ORAL | 3 refills | Status: DC

## 2022-01-13 ENCOUNTER — Institutional Professional Consult (permissible substitution): Payer: BC Managed Care – PPO | Admitting: Neurology

## 2022-01-27 ENCOUNTER — Ambulatory Visit (INDEPENDENT_AMBULATORY_CARE_PROVIDER_SITE_OTHER): Payer: Medicare Other | Admitting: Family Medicine

## 2022-01-27 ENCOUNTER — Encounter: Payer: Self-pay | Admitting: Family Medicine

## 2022-01-27 VITALS — BP 130/62 | HR 58 | Temp 98.2°F | Resp 16 | Ht 72.0 in | Wt 236.0 lb

## 2022-01-27 DIAGNOSIS — S61011D Laceration without foreign body of right thumb without damage to nail, subsequent encounter: Secondary | ICD-10-CM

## 2022-01-27 NOTE — Patient Instructions (Addendum)
Keep wound clean and covered, especially at the beach and remove bandage after being at the beach to make sure no sand or other foreign bodies are  Cleanse with soapy water running over the wound but no scrubbing.  Continue to keep Xeroform bandage over the area until the wet appearance resolves.  Continue antibiotic until completed.  Send me an updated photo of the area in the next few days and then once next week.  Follow-up with me on July 10.  If any worsening symptoms including redness from area, increased pain, odor,  or discharge be seen in urgent care.  Good luck!

## 2022-01-27 NOTE — Progress Notes (Signed)
Subjective:  Patient ID: George Garner, male    DOB: [birth year]  Age: 66 y.o. MRN: [id number]  CC:  Chief Complaint  Patient presents with   Wound Check    Pt went to urgent care Thursday and Monday to have tip of thumb looked at, cauterized, notes no drainage from wound, seems to be some blood at the surface of the wound     HPI George Garner presents for   Right thumb laceration Urgent care note reviewed 23rd.  He was cutting vegetables tonight prior.  Initial treatment with cleansing, Bactroban ointment.  Tdap given, started on Keflex 500 mg twice daily for 7 days.  Bovie was utilized to stop bleeding and hemostasis was achieved.  Xeroform gauze 4 x 4's and Coban applied.  Repeat visit in June 26th.  Dressing changes daily.  Without sign of infection.  Continued daily dressing changes with Xeroform, Vaseline fine mesh at home.  Advised to follow-up with me today.  Less sore. Dressing changes daily with xeroform. No bleeding. Some wet appearance.  No surrounding redness. Still on abx.  No pain meds needed past 2 days.    History Patient Active Problem List   Diagnosis Date Noted   Prostate cancer ([place]) 01/10/2020   Fatigue 05/24/2018   Somnolence, daytime 05/24/2018   Obstructive sleep apnea treated with continuous positive airway pressure (CPAP) 01/31/2018   Snoring 09/28/2017   UARS (upper airway resistance syndrome) 09/28/2017   Retrognathia 09/28/2017   Allergic rhinitis due to allergen 09/28/2017   Obesity (BMI 30.0-34.9) 09/28/2017   Chronic pain of left knee 02/09/2017   Other seasonal allergic rhinitis 05/24/2014   Dyslipidemia    HYPOGODADISM MALE    Past Medical History:  Diagnosis Date   Allergy    Zyrtec daily.  Flonase PRN.   BPH (benign prostatic hyperplasia)    Dyslipidemia    Erectile dysfunction    GERD (gastroesophageal reflux disease)    Hyperlipidemia    Hypogonadism male    Obstructive sleep apnea    OSA on CPAP    Sleep apnea    Past  Surgical History:  Procedure Laterality Date   CHOLECYSTECTOMY     COLONOSCOPY     deviated septum-cyst     EYE SURGERY     L eye tumor benign.   GALLBLADDER SURGERY     INGUINAL HERNIA REPAIR N/A 01/10/2020   Procedure: HERNIA REPAIR UMBILICAL, ADULT;  Surgeon: Raynelle Bring, MD;  Location: [facility];  Service: Urology;  Laterality: N/A;   LYMPHADENECTOMY Bilateral 01/10/2020   Procedure: LYMPHADENECTOMY, PELVIC;  Surgeon: Raynelle Bring, MD;  Location: [facility];  Service: Urology;  Laterality: Bilateral;   PILONIDAL CYST EXCISION     PROSTATE SURGERY N/A    Phreesia 08/26/2020   ROBOT ASSISTED LAPAROSCOPIC RADICAL PROSTATECTOMY N/A 01/10/2020   Procedure: XI ROBOTIC ASSISTED LAPAROSCOPIC RADICAL PROSTATECTOMY LEVEL 2;  Surgeon: Raynelle Bring, MD;  Location: [facility];  Service: Urology;  Laterality: N/A;   VASECTOMY     Allergies  Allergen Reactions   Penicillins Rash    Teen Years   Prior to Admission medications   Medication Sig Start Date End Date Taking? Authorizing Provider  acetaminophen (TYLENOL) 500 MG tablet Take 500 mg by mouth every 6 (six) hours as needed for mild pain or headache.    Yes [provider]  aspirin EC 81 MG tablet Take 81 mg by mouth daily. Swallow whole.   Yes [provider]  cephALEXin (KEFLEX) 500  MG capsule Take by mouth 2 (two) times daily. 01/22/22  Yes [provider]  cetirizine (ZYRTEC) 10 MG tablet Take 1 tablet (10 mg total) by mouth daily. Patient taking differently: Take 10 mg by mouth at bedtime. 12/10/16  Yes McVey, Gelene Mink, PA-C  fluticasone (FLONASE) 50 MCG/ACT nasal spray Place 2 sprays into both nostrils daily. 12/10/16  Yes McVey, Gelene Mink, PA-C  ipratropium (ATROVENT) 0.06 % nasal spray Place 1-2 sprays into both nostrils 4 (four) times daily. As needed for nasal congestion 08/17/21  Yes Wendie Agreste, MD  montelukast (SINGULAIR) 10 MG tablet Take 1 tablet (10 mg total) by mouth daily. 10/01/21  Yes  Wendie Agreste, MD  rosuvastatin (CRESTOR) 10 MG tablet Take 1 tablet (10 mg total) by mouth daily. 12/25/21 12/20/22 Yes Buford Dresser, MD   Social History   Socioeconomic History   Marital status: Married    Spouse name: Not on file   Number of children: Not on file   Years of education: Not on file   Highest education level: Not on file  Occupational History   Occupation: minister    Employer: [employer]  Tobacco Use   Smoking status: Former    Types: Cigarettes    Quit date: 2002    Years since quitting: 21.5   Smokeless tobacco: Never  Vaping Use   Vaping Use: Never used  Substance and Sexual Activity   Alcohol use: Yes    Alcohol/week: 14.0 standard drinks of alcohol    Types: 14 Cans of beer per week    Comment: 1-2 beers daily now hes retired   Drug use: No   Sexual activity: Yes    Birth control/protection: Other-see comments    Comment: vasectomy  Other Topics Concern   Not on file  Social History Narrative   Marital status: married x 26 years.      Children: 3 children (23, 21, 16); no grandchildren.      Lives: with wife, 1 child at home       Employment: [employer] x 13 years.      Tobacco: quit smoking 15 years ago.      Alcohol: rarely      Exercise:  3 times per week (weights, cardio).      Seatbelt: 100%      Guns: none   Social Determinants of Health   Financial Resource Strain: Low Risk  (10/28/2021)   Overall Financial Resource Strain (CARDIA)    Difficulty of Paying Living Expenses: Not hard at all  Food Insecurity: No Food Insecurity (10/28/2021)   Hunger Vital Sign    Worried About Running Out of Food in the Last Year: Never true    Ran Out of Food in the Last Year: Never true  Transportation Needs: No Transportation Needs (10/28/2021)   PRAPARE - Hydrologist (Medical): No    Lack of Transportation (Non-Medical): No  Physical Activity: Insufficiently Active  (10/28/2021)   Exercise Vital Sign    Days of Exercise per Week: 4 days    Minutes of Exercise per Session: 30 min  Stress: Not on file  Social Connections: Not on file  Intimate Partner Violence: Not on file    Review of Systems Per HPI   Objective:   Vitals:   01/27/22 1437  BP: 130/62  Pulse: (!) 58  Resp: 16  Temp: 98.2 F (36.8 C)  TempSrc: Temporal  SpO2: 99%  Weight: 236 lb (107 kg)  Height: 6' (1.829 m)       Assessment & Plan:  Revan Gendron is a 66 y.o. male . Laceration of right thumb without foreign body without damage to nail, subsequent encounter Laceration status post cauterization as above.  Appears to be healing without signs of secondary infection.  Continue wound care with Xeroform bandage until open area closes further.  Keep wound clean, covered, soap and water over area without scrubbing.  Updated photo requested next few days and sometime next week, but urgent care precautions given if any surrounding redness, discharge, odor or worsening.  Recheck in office July 10.  No orders of the defined types were placed in this encounter.  Patient Instructions  Keep wound clean and covered, especially at the beach and remove bandage after being at the beach to make sure no sand or other foreign bodies are  Cleanse with soapy water running over the wound but no scrubbing.  Continue to keep Xeroform bandage over the area until the wet appearance resolves.  Continue antibiotic until completed.  Send me an updated photo of the area in the next few days and then once next week.  Follow-up with me on July 10.  If any worsening symptoms including redness from area, increased pain, odor,  or discharge be seen in urgent care.  Good luck!    Signed,   Merri Ray, MD [place] 01/27/22 3:26 PM

## 2022-01-30 ENCOUNTER — Encounter: Payer: Self-pay | Admitting: Family Medicine

## 2022-02-01 NOTE — Telephone Encounter (Signed)
Update photo

## 2022-02-08 ENCOUNTER — Ambulatory Visit: Payer: Medicare Other | Admitting: Family Medicine

## 2022-02-17 ENCOUNTER — Ambulatory Visit: Payer: Medicare Other | Admitting: Family Medicine

## 2022-02-22 ENCOUNTER — Encounter (HOSPITAL_BASED_OUTPATIENT_CLINIC_OR_DEPARTMENT_OTHER): Payer: Self-pay

## 2022-03-01 ENCOUNTER — Other Ambulatory Visit (HOSPITAL_BASED_OUTPATIENT_CLINIC_OR_DEPARTMENT_OTHER): Payer: Self-pay | Admitting: Cardiology

## 2022-03-02 LAB — LIPID PANEL
Chol/HDL Ratio: 3.3 ratio (ref 0.0–5.0)
Cholesterol, Total: 130 mg/dL (ref 100–199)
HDL: 40 mg/dL (ref >39)
LDL Chol Calc (NIH): 72 mg/dL (ref 0–99)
Triglycerides: 95 mg/dL (ref 0–149)
VLDL Cholesterol Cal: 18 mg/dL (ref 5–40)

## 2022-03-02 LAB — HEPATIC FUNCTION PANEL
ALT: 45 IU/L — ABNORMAL HIGH (ref 0–44)
AST: 35 IU/L (ref 0–40)
Albumin: 4.4 g/dL (ref 3.9–4.9)
Alkaline Phosphatase: 56 IU/L (ref 44–121)
Bilirubin Total: 0.5 mg/dL (ref 0.0–1.2)
Bilirubin, Direct: 0.16 mg/dL (ref 0.00–0.40)
Total Protein: 6.8 g/dL (ref 6.0–8.5)

## 2022-04-06 ENCOUNTER — Encounter: Payer: Self-pay | Admitting: Family Medicine

## 2022-04-06 NOTE — Progress Notes (Deleted)
Subjective:  Patient ID: George Garner, male    DOB: 11-17-1955  Age: 66 y.o. MRN: [id number]  CC: No chief complaint on file.   HPI George Garner presents for   Hyperlipidemia: On crestor '10mg'$  QD. Recent labs by cardiology noted. No med changes.  Lab Results  Component Value Date   CHOL 130 03/01/2022   HDL 40 03/01/2022   LDLCALC 72 03/01/2022   TRIG 95 03/01/2022   CHOLHDL 3.3 03/01/2022   Lab Results  Component Value Date   ALT 45 (H) 03/01/2022   AST 35 03/01/2022   ALKPHOS 56 03/01/2022   BILITOT 0.5 03/01/2022   Hyperglycemia Normal A1c in March. Glucose 97. Prior 104 in Jan 2022.  Wt Readings from Last 3 Encounters:  01/27/22 236 lb (107 kg)  11/23/21 252 lb (114.3 kg)  10/28/21 250 lb (113.4 kg)   All Rhinitis: Treated with zyrtec, flonase, singulair.   HM:  Covid booster: Flu vaccine:    History Patient Active Problem List   Diagnosis Date Noted   Prostate cancer ([place]) 01/10/2020   Fatigue 05/24/2018   Somnolence, daytime 05/24/2018   Obstructive sleep apnea treated with continuous positive airway pressure (CPAP) 01/31/2018   Snoring 09/28/2017   UARS (upper airway resistance syndrome) 09/28/2017   Retrognathia 09/28/2017   Allergic rhinitis due to allergen 09/28/2017   Obesity (BMI 30.0-34.9) 09/28/2017   Chronic pain of left knee 02/09/2017   Other seasonal allergic rhinitis 05/24/2014   Dyslipidemia    HYPOGODADISM MALE    Past Medical History:  Diagnosis Date   Allergy    Zyrtec daily.  Flonase PRN.   BPH (benign prostatic hyperplasia)    Dyslipidemia    Erectile dysfunction    GERD (gastroesophageal reflux disease)    Hyperlipidemia    Hypogonadism male    Obstructive sleep apnea    OSA on CPAP    Sleep apnea    Past Surgical History:  Procedure Laterality Date   CHOLECYSTECTOMY     COLONOSCOPY     deviated septum-cyst     EYE SURGERY     L eye tumor benign.   GALLBLADDER SURGERY     INGUINAL HERNIA REPAIR N/A  01/10/2020   Procedure: HERNIA REPAIR UMBILICAL, ADULT;  Surgeon: Raynelle Bring, MD;  [facility]: [facility];  Service: Urology;  Laterality: N/A;   LYMPHADENECTOMY Bilateral 01/10/2020   Procedure: LYMPHADENECTOMY, PELVIC;  Surgeon: Raynelle Bring, MD;  Location: [facility];  Service: Urology;  Laterality: Bilateral;   PILONIDAL CYST EXCISION     PROSTATE SURGERY N/A    Phreesia 08/26/2020   ROBOT ASSISTED LAPAROSCOPIC RADICAL PROSTATECTOMY N/A 01/10/2020   Procedure: XI ROBOTIC ASSISTED LAPAROSCOPIC RADICAL PROSTATECTOMY LEVEL 2;  Surgeon: Raynelle Bring, MD;  Location: [facility];  Service: Urology;  Laterality: N/A;   VASECTOMY     Allergies  Allergen Reactions   Penicillins Rash    Teen Years   Prior to Admission medications   Medication Sig Start Date End Date Taking? Authorizing Provider  acetaminophen (TYLENOL) 500 MG tablet Take 500 mg by mouth every 6 (six) hours as needed for mild pain or headache.     [provider]  aspirin EC 81 MG tablet Take 81 mg by mouth daily. Swallow whole.    [provider]  cephALEXin (KEFLEX) 500 MG capsule Take by mouth 2 (two) times daily. 01/22/22   [provider]  cetirizine (ZYRTEC) 10 MG tablet Take 1 tablet (10 mg total) by mouth daily.  Patient taking differently: Take 10 mg by mouth at bedtime. 12/10/16   McVey, Gelene Mink, PA-C  fluticasone (FLONASE) 50 MCG/ACT nasal spray Place 2 sprays into both nostrils daily. 12/10/16   McVey, Gelene Mink, PA-C  ipratropium (ATROVENT) 0.06 % nasal spray Place 1-2 sprays into both nostrils 4 (four) times daily. As needed for nasal congestion 08/17/21   Wendie Agreste, MD  montelukast (SINGULAIR) 10 MG tablet Take 1 tablet (10 mg total) by mouth daily. 10/01/21   Wendie Agreste, MD  rosuvastatin (CRESTOR) 10 MG tablet Take 1 tablet (10 mg total) by mouth daily. 12/25/21 12/20/22  Buford Dresser, MD   Social History   Socioeconomic History   Marital status: Married     Spouse name: Not on file   Number of children: Not on file   Years of education: Not on file   Highest education level: Not on file  Occupational History   Occupation: minister    Employer: [employer]  Tobacco Use   Smoking status: Former    Types: Cigarettes    Quit date: 2002    Years since quitting: 21.6   Smokeless tobacco: Never  Vaping Use   Vaping Use: Never used  Substance and Sexual Activity   Alcohol use: Yes    Alcohol/week: 14.0 standard drinks of alcohol    Types: 14 Cans of beer per week    Comment: 1-2 beers daily now hes retired   Drug use: No   Sexual activity: Yes    Birth control/protection: Other-see comments    Comment: vasectomy  Other Topics Concern   Not on file  Social History Narrative   Marital status: married x 26 years.      Children: 3 children (23, 21, 16); no grandchildren.      Lives: with wife, 1 child at home       Employment: [employer] x 13 years.      Tobacco: quit smoking 15 years ago.      Alcohol: rarely      Exercise:  3 times per week (weights, cardio).      Seatbelt: 100%      Guns: none   Social Determinants of Health   Financial Resource Strain: Low Risk  (10/28/2021)   Overall Financial Resource Strain (CARDIA)    Difficulty of Paying Living Expenses: Not hard at all  Food Insecurity: No Food Insecurity (10/28/2021)   Hunger Vital Sign    Worried About Running Out of Food in the Last Year: Never true    Ran Out of Food in the Last Year: Never true  Transportation Needs: No Transportation Needs (10/28/2021)   PRAPARE - Hydrologist (Medical): No    Lack of Transportation (Non-Medical): No  Physical Activity: Insufficiently Active (10/28/2021)   Exercise Vital Sign    Days of Exercise per Week: 4 days    Minutes of Exercise per Session: 30 min  Stress: Not on file  Social Connections: Not on file  Intimate Partner Violence: Not on file    Review of  Systems   Objective:  There were no vitals filed for this visit.   Physical Exam     Assessment & Plan:  George Garner is a 66 y.o. male . Hyperglycemia  Nasal congestion  Allergic rhinitis, unspecified seasonality, unspecified trigger  Dyslipidemia   No orders of the defined types were placed in this encounter.  There are no Patient Instructions  on file for this visit.    Signed,   Merri Ray, MD [place] 04/06/22 3:30 PM

## 2022-04-07 ENCOUNTER — Ambulatory Visit: Payer: Medicare Other | Admitting: Family Medicine

## 2022-04-09 ENCOUNTER — Encounter (HOSPITAL_BASED_OUTPATIENT_CLINIC_OR_DEPARTMENT_OTHER): Payer: Self-pay | Admitting: Orthopaedic Surgery

## 2022-04-09 ENCOUNTER — Ambulatory Visit (INDEPENDENT_AMBULATORY_CARE_PROVIDER_SITE_OTHER): Payer: Medicare Other | Admitting: Orthopaedic Surgery

## 2022-04-09 ENCOUNTER — Ambulatory Visit (INDEPENDENT_AMBULATORY_CARE_PROVIDER_SITE_OTHER): Payer: Medicare Other

## 2022-04-09 DIAGNOSIS — M25522 Pain in left elbow: Secondary | ICD-10-CM

## 2022-04-09 DIAGNOSIS — M79632 Pain in left forearm: Secondary | ICD-10-CM

## 2022-04-09 DIAGNOSIS — M79602 Pain in left arm: Secondary | ICD-10-CM | POA: Diagnosis not present

## 2022-04-09 MED ORDER — LIDOCAINE HCL 1 % IJ SOLN
4.0000 mL | INTRAMUSCULAR | Status: AC | PRN
  Administered 2022-04-09: 4 mL

## 2022-04-09 MED ORDER — TRIAMCINOLONE ACETONIDE 40 MG/ML IJ SUSP
80.0000 mg | INTRAMUSCULAR | Status: AC | PRN
  Administered 2022-04-09: 80 mg via INTRA_ARTICULAR

## 2022-04-09 NOTE — Progress Notes (Signed)
Chief Complaint: Left elbow pain     History of Present Illness:    George Garner is a 66 y.o. male presents today with left elbow pain tenderness over the proximal forearm.  He does play the acoustic attire has had pain about the elbow with this.  He is he has been using ice and has done acupuncture with limited relief.  He has attempted to rest although his elbow pain has been persistent while playing the entire.  He is here today for further assessment.  He is right-hand dominant    Surgical History:   none  PMH/PSH/Family History/Social History/Meds/Allergies:    Past Medical History:  Diagnosis Date  . Allergy    Zyrtec daily.  Flonase PRN.  . BPH (benign prostatic hyperplasia)   . Dyslipidemia   . Erectile dysfunction   . GERD (gastroesophageal reflux disease)   . Hyperlipidemia   . Hypogonadism male   . Obstructive sleep apnea   . OSA on CPAP   . Sleep apnea    Past Surgical History:  Procedure Laterality Date  . CHOLECYSTECTOMY    . COLONOSCOPY    . deviated septum-cyst    . EYE SURGERY     L eye tumor benign.  Marland Kitchen GALLBLADDER SURGERY    . INGUINAL HERNIA REPAIR N/A 01/10/2020   Procedure: HERNIA REPAIR UMBILICAL, ADULT;  Surgeon: Raynelle Bring, MD;  [facility];  Service: Urology;  Laterality: N/A;  . LYMPHADENECTOMY Bilateral 01/10/2020   Procedure: LYMPHADENECTOMY, PELVIC;  Surgeon: Raynelle Bring, MD;  [facility];  Service: Urology;  Laterality: Bilateral;  . PILONIDAL CYST EXCISION    . PROSTATE SURGERY N/A    Phreesia 08/26/2020  . ROBOT ASSISTED LAPAROSCOPIC RADICAL PROSTATECTOMY N/A 01/10/2020   Procedure: XI ROBOTIC ASSISTED LAPAROSCOPIC RADICAL PROSTATECTOMY LEVEL 2;  Surgeon: Raynelle Bring, MD;  [facility];  Service: Urology;  Laterality: N/A;  . VASECTOMY     Social History   Socioeconomic History  . Marital status: Married    Spouse name: Not on file  . Number of children: Not on file  .  Years of education: Not on file  . Highest education level: Not on file  Occupational History  . Occupation: Best boy: [employer]  Tobacco Use  . Smoking status: Former    Types: Cigarettes    Quit date: 2002    Years since quitting: 21.6  . Smokeless tobacco: Never  Vaping Use  . Vaping Use: Never used  Substance and Sexual Activity  . Alcohol use: Yes    Alcohol/week: 14.0 standard drinks of alcohol    Types: 14 Cans of beer per week    Comment: 1-2 beers daily now hes retired  . Drug use: No  . Sexual activity: Yes    Birth control/protection: Other-see comments    Comment: vasectomy  Other Topics Concern  . Not on file  Social History Narrative   Marital status: married x 26 years.      Children: 3 children (23, 21, 16); no grandchildren.      Lives: with wife, 1 child at home       Employment: [employer] x 13 years.      Tobacco: quit smoking 15 years ago.      Alcohol:  rarely      Exercise:  3 times per week (weights, cardio).      Seatbelt: 100%      Guns: none   Social Determinants of Health   Financial Resource Strain: Low Risk  (10/28/2021)   Overall Financial Resource Strain (CARDIA)   . Difficulty of Paying Living Expenses: Not hard at all  Food Insecurity: No Food Insecurity (10/28/2021)   Hunger Vital Sign   . Worried About Charity fundraiser in the Last Year: Never true   . Ran Out of Food in the Last Year: Never true  Transportation Needs: No Transportation Needs (10/28/2021)   PRAPARE - Transportation   . Lack of Transportation (Medical): No   . Lack of Transportation (Non-Medical): No  Physical Activity: Insufficiently Active (10/28/2021)   Exercise Vital Sign   . Days of Exercise per Week: 4 days   . Minutes of Exercise per Session: 30 min  Stress: Not on file  Social Connections: Not on file   Family History  Problem Relation Age of Onset  . Diabetes Mother   . Dementia Mother   . Arthritis  Brother   . Colon cancer Neg Hx   . Colon polyps Neg Hx   . Esophageal cancer Neg Hx   . Stomach cancer Neg Hx   . Rectal cancer Neg Hx    Allergies  Allergen Reactions  . Penicillins Rash    Teen Years   Current Outpatient Medications  Medication Sig Dispense Refill  . acetaminophen (TYLENOL) 500 MG tablet Take 500 mg by mouth every 6 (six) hours as needed for mild pain or headache.     Marland Kitchen aspirin EC 81 MG tablet Take 81 mg by mouth daily. Swallow whole.    . cephALEXin (KEFLEX) 500 MG capsule Take by mouth 2 (two) times daily.    . cetirizine (ZYRTEC) 10 MG tablet Take 1 tablet (10 mg total) by mouth daily. (Patient taking differently: Take 10 mg by mouth at bedtime.) 30 tablet 1  . fluticasone (FLONASE) 50 MCG/ACT nasal spray Place 2 sprays into both nostrils daily. 16 g 1  . ipratropium (ATROVENT) 0.06 % nasal spray Place 1-2 sprays into both nostrils 4 (four) times daily. As needed for nasal congestion 15 mL 5  . montelukast (SINGULAIR) 10 MG tablet Take 1 tablet (10 mg total) by mouth daily. 90 tablet 3  . rosuvastatin (CRESTOR) 10 MG tablet Take 1 tablet (10 mg total) by mouth daily. 90 tablet 3   No current facility-administered medications for this visit.   No results found.  Review of Systems:   A ROS was performed including pertinent positives and negatives as documented in the HPI.  Physical Exam :   Constitutional: NAD and appears stated age Neurological: Alert and oriented Psych: Appropriate affect and cooperative There were no vitals taken for this visit.   Comprehensive Musculoskeletal Exam:    Inspection Right Left  Skin No atrophy or gross abnormalities appreciated No atrophy or gross abnormalities appreciated  Palpation    Tenderness None Lateral epicondyle  Crepitus None None  Range of Motion    Flexion (passive) 160 160  Flexion (active) 160 160  Extension 0 0  Pronation normal normal  Supination normal normal   Strength    Flexion  5/5 5/5   Extension 5/5 5/5  Pronation  5/5 5/5  Supination  5/5 5/5  Special Tests    Lateral epicondyle pain with resisted wrist extension Negative Positive I think  Medial epicondyle pain with resisted wrist flexion  Negative Negative  Tinel test over cubital tunnel  Negative  Negative   Instability    Generalized Laxity No No  Varus stress No instability No instability  Valgus stress  No instability No instability  Reflexes    Triceps Normal (2/4) Normal (2/4)  Brachioradialis Normal (2/4) Normal (2/4)  Biceps Normal (2/4) Normal (2/4)  Neurologic    Fires PIN, radial, median, ulnar, musculocutaneous, axillary, suprascapular, long thoracic, and spinal accessory innervated muscles. No abnormal sensibility.  Vascular/Lymphatic    Radial Pulse 2+ 2+  Cervical Exam    Patient has symmetric cervical range of motion with Negative Spurling's test.    Imaging:   Xray (left forearm xray 2 view): Normal   I personally reviewed and interpreted the radiographs.   Assessment:   66 y.o. male presents today with left lateral epicondylitis.  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  We did discuss treatment options including an injection as well as counterforce bracing.  He would like to proceed with an injection.  I will see him back as needed if this does not resolve his pain completely  Plan :    -Return to clinic as needed should this not improve his pain    Procedure Note  Patient: George Garner             Date of Birth: November 02, 1955           MRN: [id number]             Visit Date: 04/09/2022  Procedures: Visit Diagnoses:  1. Pain in left forearm     Medium Joint Inj: L elbow on 04/09/2022 10:05 AM Indications: pain Details: 22 G 1.5 in needle, ultrasound-guided lateral approach Medications: 4 mL lidocaine 1 %; 80 mg triamcinolone acetonide 40 MG/ML Outcome: tolerated well, no immediate complications Consent was given by the patient. Immediately prior to  procedure a time out was called to verify the correct patient, procedure, equipment, support staff and site/side marked as required. Patient was prepped and draped in the usual sterile fashion.          I personally saw and evaluated the patient, and participated in the management and treatment plan.  Vanetta Mulders, MD Attending Physician, Orthopedic Surgery  This document was dictated using Dragon voice recognition software. A reasonable attempt at proof reading has been made to minimize errors.

## 2022-04-21 ENCOUNTER — Encounter: Payer: Self-pay | Admitting: Family Medicine

## 2022-04-21 ENCOUNTER — Ambulatory Visit (INDEPENDENT_AMBULATORY_CARE_PROVIDER_SITE_OTHER): Payer: Medicare Other | Admitting: Family Medicine

## 2022-04-21 VITALS — BP 122/68 | HR 70 | Temp 98.7°F | Ht 72.0 in | Wt 237.4 lb

## 2022-04-21 DIAGNOSIS — Z8639 Personal history of other endocrine, nutritional and metabolic disease: Secondary | ICD-10-CM

## 2022-04-21 DIAGNOSIS — E785 Hyperlipidemia, unspecified: Secondary | ICD-10-CM

## 2022-04-21 DIAGNOSIS — E669 Obesity, unspecified: Secondary | ICD-10-CM | POA: Diagnosis not present

## 2022-04-21 DIAGNOSIS — Z6832 Body mass index (BMI) 32.0-32.9, adult: Secondary | ICD-10-CM | POA: Diagnosis not present

## 2022-04-21 NOTE — Patient Instructions (Signed)
Thanks for coming in today.  Glad to hear retirement is going well.  Continue Crestor, numbers from cardiology look good.  I will hold on labs today, we can recheck those in 6 months if not done by cardiology at that time.  Please let me know if there are questions and take care!

## 2022-04-21 NOTE — Progress Notes (Signed)
Subjective:  Patient ID: George Garner, male    DOB: June 24, 1956  Age: 66 y.o. MRN: [id number]  CC:  Chief Complaint  Patient presents with   Hyperlipidemia   Hyperglycemia    Pt states all is well    HPI George Garner presents for   Thumb lac has improved, slight numbness around scar, otherwise doing ok. No other concerns.  Retired since January 1st.  Now on staff with [employer].   Hyperlipidemia: Diet/exercise approach previously.  Now on crestor '10mg'$  qd. Doing ok - no new side effects.  Improved lipids on repeat testing by cardiology.  Borderline ALT discussed with cardiology result note. Lab Results  Component Value Date   CHOL 130 03/01/2022   HDL 40 03/01/2022   LDLCALC 72 03/01/2022   TRIG 95 03/01/2022   CHOLHDL 3.3 03/01/2022   Lab Results  Component Value Date   ALT 45 (H) 03/01/2022   AST 35 03/01/2022   ALKPHOS 56 03/01/2022   BILITOT 0.5 03/01/2022   Obesity with hyperglycemia Diet/exercise approach, elevated glucose last year Which has improved since April.watching portion sizes.  Walking 2-3 miles each morning and yardwork.  Glucose normal on March 2, A1c normal at 5.5 at that time.  Previously 5.7  few years ago.  Body mass index is 32.2 kg/m. Wt Readings from Last 3 Encounters:  04/21/22 237 lb 6.4 oz (107.7 kg)  01/27/22 236 lb (107 kg)  11/23/21 252 lb (114.3 kg)    Lab Results  Component Value Date   HGBA1C 5.5 10/01/2021   Wt Readings from Last 3 Encounters:  04/21/22 237 lb 6.4 oz (107.7 kg)  01/27/22 236 lb (107 kg)  11/23/21 252 lb (114.3 kg)   HM: Flu vaccine - will be having in October with study. Covid booster - recommended new booster. Last bivalent in April.  Had shingrix at [address] x 2.   History Patient Active Problem List   Diagnosis Date Noted   Prostate cancer ([place]) 01/10/2020   Fatigue 05/24/2018   Somnolence, daytime 05/24/2018   Obstructive sleep apnea treated with continuous positive airway pressure  (CPAP) 01/31/2018   Snoring 09/28/2017   UARS (upper airway resistance syndrome) 09/28/2017   Retrognathia 09/28/2017   Allergic rhinitis due to allergen 09/28/2017   Obesity (BMI 30.0-34.9) 09/28/2017   Chronic pain of left knee 02/09/2017   Other seasonal allergic rhinitis 05/24/2014   Dyslipidemia    HYPOGODADISM MALE    Past Medical History:  Diagnosis Date   Allergy    Zyrtec daily.  Flonase PRN.   BPH (benign prostatic hyperplasia)    Dyslipidemia    Erectile dysfunction    GERD (gastroesophageal reflux disease)    Hyperlipidemia    Hypogonadism male    Obstructive sleep apnea    OSA on CPAP    Sleep apnea    Past Surgical History:  Procedure Laterality Date   CHOLECYSTECTOMY     COLONOSCOPY     deviated septum-cyst     EYE SURGERY     L eye tumor benign.   GALLBLADDER SURGERY     INGUINAL HERNIA REPAIR N/A 01/10/2020   Procedure: HERNIA REPAIR UMBILICAL, ADULT;  Surgeon: Raynelle Bring, MD;  [facility];  Service: Urology;  Laterality: N/A;   LYMPHADENECTOMY Bilateral 01/10/2020   Procedure: LYMPHADENECTOMY, PELVIC;  Surgeon: Raynelle Bring, MD;  [facility];  Service: Urology;  Laterality: Bilateral;   PILONIDAL CYST EXCISION     PROSTATE SURGERY N/A  Phreesia 08/26/2020   ROBOT ASSISTED LAPAROSCOPIC RADICAL PROSTATECTOMY N/A 01/10/2020   Procedure: XI ROBOTIC ASSISTED LAPAROSCOPIC RADICAL PROSTATECTOMY LEVEL 2;  Surgeon: Raynelle Bring, MD;  [facility];  Service: Urology;  Laterality: N/A;   VASECTOMY     Allergies  Allergen Reactions   Penicillins Rash    Teen Years   Prior to Admission medications   Medication Sig Start Date End Date Taking? Authorizing Provider  acetaminophen (TYLENOL) 500 MG tablet Take 500 mg by mouth every 6 (six) hours as needed for mild pain or headache.    Yes [provider]  aspirin EC 81 MG tablet Take 81 mg by mouth daily. Swallow whole.   Yes [provider]  cetirizine (ZYRTEC) 10 MG tablet  Take 1 tablet (10 mg total) by mouth daily. Patient taking differently: Take 10 mg by mouth at bedtime. 12/10/16  Yes McVey, Gelene Mink, PA-C  fluticasone (FLONASE) 50 MCG/ACT nasal spray Place 2 sprays into both nostrils daily. 12/10/16  Yes McVey, Gelene Mink, PA-C  montelukast (SINGULAIR) 10 MG tablet Take 1 tablet (10 mg total) by mouth daily. 10/01/21  Yes Wendie Agreste, MD  rosuvastatin (CRESTOR) 10 MG tablet Take 1 tablet (10 mg total) by mouth daily. 12/25/21 12/20/22 Yes Buford Dresser, MD  cephALEXin (KEFLEX) 500 MG capsule Take by mouth 2 (two) times daily. Patient not taking: Reported on 04/21/2022 01/22/22   [provider]  ipratropium (ATROVENT) 0.06 % nasal spray Place 1-2 sprays into both nostrils 4 (four) times daily. As needed for nasal congestion Patient not taking: Reported on 04/21/2022 08/17/21   Wendie Agreste, MD   Social History   Socioeconomic History   Marital status: Married    Spouse name: Not on file   Number of children: Not on file   Years of education: Not on file   Highest education level: Not on file  Occupational History   Occupation: minister    Employer: [employer]  Tobacco Use   Smoking status: Former    Types: Cigarettes    Quit date: 2002    Years since quitting: 21.7   Smokeless tobacco: Never  Vaping Use   Vaping Use: Never used  Substance and Sexual Activity   Alcohol use: Yes    Alcohol/week: 14.0 standard drinks of alcohol    Types: 14 Cans of beer per week    Comment: 1-2 beers daily now hes retired   Drug use: No   Sexual activity: Yes    Birth control/protection: Other-see comments    Comment: vasectomy  Other Topics Concern   Not on file  Social History Narrative   Marital status: married x 26 years.      Children: 3 children (23, 21, 16); no grandchildren.      Lives: with wife, 1 child at home       Employment: [employer] x 13 years.      Tobacco: quit  smoking 15 years ago.      Alcohol: rarely      Exercise:  3 times per week (weights, cardio).      Seatbelt: 100%      Guns: none   Social Determinants of Health   Financial Resource Strain: Low Risk  (10/28/2021)   Overall Financial Resource Strain (CARDIA)    Difficulty of Paying Living Expenses: Not hard at all  Food Insecurity: No Food Insecurity (10/28/2021)   Hunger Vital Sign    Worried About Running Out  of Food in the Last Year: Never true    [place] in the Last Year: Never true  Transportation Needs: No Transportation Needs (10/28/2021)   PRAPARE - Hydrologist (Medical): No    Lack of Transportation (Non-Medical): No  Physical Activity: Insufficiently Active (10/28/2021)   Exercise Vital Sign    Days of Exercise per Week: 4 days    Minutes of Exercise per Session: 30 min  Stress: Not on file  Social Connections: Not on file  Intimate Partner Violence: Not on file    Review of Systems Per HPI.   Objective:   Vitals:   04/21/22 1051  BP: 122/68  Pulse: 70  Temp: 98.7 F (37.1 C)  SpO2: 96%  Weight: 237 lb 6.4 oz (107.7 kg)  Height: 6' (1.829 m)     Physical Exam Vitals reviewed.  Constitutional:      Appearance: He is well-developed.  HENT:     Head: Normocephalic and atraumatic.  Neck:     Vascular: No carotid bruit or JVD.  Cardiovascular:     Rate and Rhythm: Normal rate and regular rhythm.     Heart sounds: Normal heart sounds. No murmur heard. Pulmonary:     Effort: Pulmonary effort is normal.     Breath sounds: Normal breath sounds. No rales.  Musculoskeletal:     Right lower leg: No edema.     Left lower leg: No edema.  Skin:    General: Skin is warm and dry.  Neurological:     Mental Status: He is alert and oriented to person, place, and time.  Psychiatric:        Mood and Affect: Mood normal.        Assessment & Plan:  George Garner is a 66 y.o. male . Dyslipidemia Recent labs noted,  improved on statin.  Tolerating current dose.  Continue same.  Commended on diet and exercise as above.  Class 1 obesity with body mass index (BMI) of 32.0 to 32.9 in adult, unspecified obesity type, unspecified whether serious comorbidity present History of hyperglycemia Improved on recent labs, continue to watch diet, exercise.  Repeat labs deferred at this time.  Discussed health maintenance as above.  No orders of the defined types were placed in this encounter.  Patient Instructions  Thanks for coming in today.  Glad to hear retirement is going well.  Continue Crestor, numbers from cardiology look good.  I will hold on labs today, we can recheck those in 6 months if not done by cardiology at that time.  Please let me know if there are questions and take care!    Signed,   Merri Ray, MD [place] 04/21/22 11:38 AM

## 2022-05-25 ENCOUNTER — Encounter: Payer: Self-pay | Admitting: Neurology

## 2022-05-25 ENCOUNTER — Ambulatory Visit (INDEPENDENT_AMBULATORY_CARE_PROVIDER_SITE_OTHER): Payer: Medicare Other | Admitting: Neurology

## 2022-05-25 VITALS — BP 154/75 | HR 88 | Ht 71.0 in | Wt 241.0 lb

## 2022-05-25 DIAGNOSIS — G4733 Obstructive sleep apnea (adult) (pediatric): Secondary | ICD-10-CM

## 2022-05-25 DIAGNOSIS — E6609 Other obesity due to excess calories: Secondary | ICD-10-CM | POA: Diagnosis not present

## 2022-05-25 DIAGNOSIS — E669 Obesity, unspecified: Secondary | ICD-10-CM

## 2022-05-25 NOTE — Patient Instructions (Signed)
Living With Sleep Apnea Sleep apnea is a condition in which breathing pauses or becomes shallow during sleep. Sleep apnea is most commonly caused by a collapsed or blocked airway. People with sleep apnea usually snore loudly. They may have times when they gasp and stop breathing for 10 seconds or more during sleep. This may happen many times during the night. The breaks in breathing also interrupt the deep sleep that you need to feel rested. Even if you do not completely wake up from the gaps in breathing, your sleep may not be restful and you feel tired during the day. You may also have a headache in the morning and low energy during the day, and you may feel anxious or depressed. How can sleep apnea affect me? Sleep apnea increases your chances of extreme tiredness during the day (daytime fatigue). It can also increase your risk for health conditions, such as: Heart attack. Stroke. Obesity. Type 2 diabetes. Heart failure. Irregular heartbeat. High blood pressure. If you have daytime fatigue as a result of sleep apnea, you may be more likely to: Perform poorly at school or work. Fall asleep while driving. Have difficulty with attention. Develop depression or anxiety. Have sexual dysfunction. What actions can I take to manage sleep apnea? Sleep apnea treatment  If you were given a device to open your airway while you sleep, use it only as told by your health care provider. You may be given: An oral appliance. This is a custom-made mouthpiece that shifts your lower jaw forward. A continuous positive airway pressure (CPAP) device. This device blows air through a mask when you breathe out (exhale). A nasal expiratory positive airway pressure (EPAP) device. This device has valves that you put into each nostril. A bi-level positive airway pressure (BIPAP) device. This device blows air through a mask when you breathe in (inhale) and breathe out (exhale). You may need surgery if other treatments  do not work for you. Sleep habits Go to sleep and wake up at the same time every day. This helps set your internal clock (circadian rhythm) for sleeping. If you stay up later than usual, such as on weekends, try to get up in the morning within 2 hours of your normal wake time. Try to get at least 7-9 hours of sleep each night. Stop using a computer, tablet, and mobile phone a few hours before bedtime. Do not take long naps during the day. If you nap, limit it to 30 minutes. Have a relaxing bedtime routine. Reading or listening to music may relax you and help you sleep. Use your bedroom only for sleep. Keep your television and computer out of your bedroom. Keep your bedroom cool, dark, and quiet. Use a supportive mattress and pillows. Follow your health care provider's instructions for other changes to sleep habits. Nutrition Do not eat heavy meals in the evening. Do not have caffeine in the later part of the day. The effects of caffeine can last for more than 5 hours. Follow your health care provider's or dietitian's instructions for any diet changes. Lifestyle     Do not drink alcohol before bedtime. Alcohol can cause you to fall asleep at first, but then it can cause you to wake up in the middle of the night and have trouble getting back to sleep. Do not use any products that contain nicotine or tobacco. These products include cigarettes, chewing tobacco, and vaping devices, such as e-cigarettes. If you need help quitting, ask your health care provider. Medicines Take   over-the-counter and prescription medicines only as told by your health care provider. Do not use over-the-counter sleep medicine. You can become dependent on this medicine, and it can make sleep apnea worse. Do not use medicines, such as sedatives and narcotics, unless told by your health care provider. Activity Exercise on most days, but avoid exercising in the evening. Exercising near bedtime can interfere with  sleeping. If possible, spend time outside every day. Natural light helps regulate your circadian rhythm. General information Lose weight if you need to, and maintain a healthy weight. Keep all follow-up visits. This is important. If you are having surgery, make sure to tell your health care provider that you have sleep apnea. You may need to bring your device with you. Where to find more information Learn more about sleep apnea and daytime fatigue from: American Sleep Association: sleepassociation.org National Sleep Foundation: sleepfoundation.org National Heart, Lung, and Blood Institute: nhlbi.nih.gov Summary Sleep apnea is a condition in which breathing pauses or becomes shallow during sleep. Sleep apnea can cause daytime fatigue and other serious health conditions. You may need to wear a device while sleeping to help keep your airway open. If you are having surgery, make sure to tell your health care provider that you have sleep apnea. You may need to bring your device with you. Making changes to sleep habits, diet, lifestyle, and activity can help you manage sleep apnea. This information is not intended to replace advice given to you by your health care provider. Make sure you discuss any questions you have with your health care provider. Document Revised: 02/25/2021 Document Reviewed: 06/27/2020 Elsevier Patient Education  2023 Elsevier Inc.  

## 2022-05-25 NOTE — Progress Notes (Signed)
SLEEP MEDICINE CLINIC   Provider:  Larey Seat, M D  Primary Care Physician:  Wendie Agreste, MD   Referring Provider: Wendie Agreste, MD   Chief Complaint  Patient presents with   Follow-up    Pt in room #10 and alone. Pt here today for f/u on sleep apnea. Pt states hes not sleeping long enough.     HPI:  George Garner is a 66 y.o. male , retired from [employer], and seen here in a RV for a CPAP follow up.   He is now with [facility] ;  05-25-2022: George Garner is now flexible, he works part time and spends more time with the grandchildren.  He remains a compliant CPAP user and has a 100% complaint for days and hours. Auto CPAP set up was in 10-2017 and next year he will qualify for a new machine. He falls asleep easily.  He reports he sleeps well, his wife likes that he doesn't snore, and only one time nocturia, but after that single break he struggles to continue his sleep.       11-23-2021: He was not seen here for over 3 years, is de-facto anew patient.has used CPAP compliantly, wife is happy and he feels rested in AM. Never had trouble to initiate sleep but still has difficulties o stay asleep. He retired January 1st 2023.  The patient also reports that he averages about 6 hours of sleep but when he wakes up very early during his night he is often not able to return to sleep, he believes the bed and mother do something productive.  She obviously looks at the clock.  He does report that he has a tendency to fall asleep when physically inactive and not mentally stimulated.  That may be sitting and reading or watching TV or even in a church or waiting room.   His sleep study was a performed here as a split-night titration on 3-28 2019 that the patient was at that time as well referred by Dr. Cindee Lame.  He already on a CPAP machine that was about a decade old.  He had lost 35 pounds when he felt that he did no longer needed.  He would like to resume CPAP due to  excessive daytime sleepiness and snoring again after he regained some weight.  At the time he endorsed the Epworth Sleepiness Scale at 17 points FSS at 26 point GDS at 2 out of 15 points.  BMI was 34.  His baseline study confirmed an AHI of 25.6/h remarkably not REM sleep dependent apnea in rem sleep there were only 2.3 AHI per hour he avoided supine sleep and only slept about 32 minutes in that position but supine sleep was associated with an AHI of 80/h.  He had 26 minutes of oxygen desaturation with a nadir at 78%.  The study was split and CPAP initiated at 5 and titrated to 7 cm water on an E son nasal mask and medium size.  Sleep efficiency became much higher on the CPAP AHI was 0.3.  And he had a follow-up visit with nurse practitioner Gilford Raid in October 2019.  He has been using the same machine since and average user time is 6 hours 1 minutes and usually his compliance is between 93 and 97%.  So he is a highly compliant CPAP user there is even a print out provided for whole year and very few days that you use the machine for less than  4 hours.  The average AHI residual is 5.5 events per hour the 95th percentile air leak is 22.6 L/min and over the last 3 weeks he had higher residual AHI than he had before which may be partially due to supply.   I have the pleasure of meeting today with George Garner by his own recollection had a sleep study probably between 2007 and 2009 ,  He has owned the same CPAP machine ever since, but quit using it after losing 35 pounds or more. Now he cannot get new supplies, but all symptoms have returned, he is snoring again.  He carries a diagnosis of obstructive sleep apnea on CPAP, respiratory allergies, taking Flonase and Zyrtec for those, hyperlipidemia GERD and benign prostate hyperplasia.  He is up-to-date with his influenza vaccinations, received itching shingles vaccine in 2013, has regular PSA scores.   Lipid is elevated still cholesterol at 254 total, and he  lost was seen at [facility] urgent care following an acute upper respiratory infection in 2018 months, he also had a full physical in January of this year.    Chief complaint according to patient : " I may need CPAP again"    Sleep habits are as follows: The patient usually watches TV for the last hour before he retreats to the bedroom, which is around 10 PM.  He has no trouble initiating sleep.  The bedroom is cool, quiet and dark.  He prefers to sleep on his side, on one firm pillow.  The bed has a flat mattress and is not adjustable.  He usually can sleep for several hours before he is woken by the urge to urinate around 3 AM, he may have 1-3 times  nocturia as each night. Rises at 6 .25 AM- he sleeps otherwise through.    Sleep medical history and family sleep history: see above, no childhood sleep disorder, such assleep walking, night terrors.   Social history:  Now retired [employer] in [place], married, raised Psychologist, forensic-  has 3 children ( aged 45, 64, 109 )He is a former smoker, does not use smokeless tobacco in any form, may drink 1 standard alcoholic beverage per week, caffeine use fairly is fairly high he states, coffee in the morning but never later than lunch time.  Hobby: gardening.     Review of Systems: Out of a complete 14 system review, the patient complains of only the following symptoms, and all other reviewed systems are negative. Sleepy when not physically active or mentally stimulated, sleepy after meals.  Stastus post prostrate surgery- 1-2 nocturia.  Snores when not using CPAP, wife wants him to continue.   How likely are you to doze in the following situations: 0 = not likely, 1 = slight chance, 2 = moderate chance, 3 = high chance  Sitting and Reading? Watching Television? Sitting inactive in a public place (theater or meeting)? Lying down in the afternoon when circumstances permit? Sitting and talking to someone? Sitting quietly after lunch without  alcohol? In a car, while stopped for a few minutes in traffic? As a passenger in a car for an hour without a break?  Total = 11/ 24 on 05-25-2022.  FSS at 19/ 63 . GDS at 2/ 15    11-23-2021: Epworth score  13/ 24  , Fatigue severity score 19/ 63  , depression score 2/ 15    Social History   Socioeconomic History   Marital status: Married    Spouse name: Not on file   Number  of children: Not on file   Years of education: Not on file   Highest education level: Not on file  Occupational History   Occupation: Company secretary, part timer     Employer: [employer]  Tobacco Use   Smoking status: Former    Types: Cigarettes    Quit date: 2002    Years since quitting: 21.8   Smokeless tobacco: Never  Vaping Use   Vaping Use: Never used  Substance and Sexual Activity   Alcohol use: Yes    Alcohol/week: 14.0 standard drinks of alcohol    Types: 14 Cans of beer per week    Comment: 1-2 beers daily now he is retired   Drug use: No   Sexual activity: Yes    Birth control/protection: Other-see comments    Comment: vasectomy  Other Topics Concern   Not on file  Social History Narrative   Marital status: married x 26 years.      Children: 3 children (23, 21, 16); no grandchildren.      Lives: with wife, 1 child at home       Employment: [employer] x 18 years. Retired.       Tobacco: quit smoking 15 years ago.      Alcohol: rarely      Exercise:  3 times per week (weights, cardio).      Seatbelt: 100%      Guns: none   Social Determinants of Health   Financial Resource Strain: Low Risk  (10/28/2021)   Overall Financial Resource Strain (CARDIA)    Difficulty of Paying Living Expenses: Not hard at all  Food Insecurity: No Food Insecurity (10/28/2021)   Hunger Vital Sign    Worried About Running Out of Food in the Last Year: Never true    Ran Out of Food in the Last Year: Never true  Transportation Needs: No Transportation Needs (10/28/2021)   PRAPARE  - Hydrologist (Medical): No    Lack of Transportation (Non-Medical): No  Physical Activity: Insufficiently Active (10/28/2021)   Exercise Vital Sign    Days of Exercise per Week: 4 days    Minutes of Exercise per Session: 30 min  Stress: Not on file  Social Connections: Not on file  Intimate Partner Violence: Not on file    Family History  Problem Relation Age of Onset   Diabetes Mother    Dementia Mother    Arthritis Brother    Colon cancer Neg Hx    Colon polyps Neg Hx    Esophageal cancer Neg Hx    Stomach cancer Neg Hx    Rectal cancer Neg Hx     Past Medical History:  Diagnosis Date   Allergy    Zyrtec daily.  Flonase PRN.   BPH (benign prostatic hyperplasia)    Dyslipidemia    Erectile dysfunction    GERD (gastroesophageal reflux disease)    Hyperlipidemia    Hypogonadism male    Obstructive sleep apnea    OSA on CPAP    Sleep apnea     Past Surgical History:  Procedure Laterality Date   CHOLECYSTECTOMY     COLONOSCOPY     deviated septum-cyst     EYE SURGERY     L eye tumor benign.   GALLBLADDER SURGERY     INGUINAL HERNIA REPAIR N/A 01/10/2020   Procedure: HERNIA REPAIR UMBILICAL, ADULT;  Surgeon: Raynelle Bring, MD;  Location: [facility];  Service: Urology;  Laterality: N/A;   LYMPHADENECTOMY Bilateral 01/10/2020   Procedure: LYMPHADENECTOMY, PELVIC;  Surgeon: Raynelle Bring, MD;  Location: [facility];  Service: Urology;  Laterality: Bilateral;   PILONIDAL CYST EXCISION     PROSTATE SURGERY N/A    Phreesia 08/26/2020   ROBOT ASSISTED LAPAROSCOPIC RADICAL PROSTATECTOMY N/A 01/10/2020   Procedure: XI ROBOTIC ASSISTED LAPAROSCOPIC RADICAL PROSTATECTOMY LEVEL 2;  Surgeon: Raynelle Bring, MD;  Location: [facility];  Service: Urology;  Laterality: N/A;   VASECTOMY      Current Outpatient Medications  Medication Sig Dispense Refill   acetaminophen (TYLENOL) 500 MG tablet Take 500 mg by mouth every 6 (six) hours as needed for mild pain or  headache.      aspirin EC 81 MG tablet Take 81 mg by mouth daily. Swallow whole.     cephALEXin (KEFLEX) 500 MG capsule Take by mouth 2 (two) times daily.     cetirizine (ZYRTEC) 10 MG tablet Take 1 tablet (10 mg total) by mouth daily. (Patient taking differently: Take 10 mg by mouth at bedtime.) 30 tablet 1   fluticasone (FLONASE) 50 MCG/ACT nasal spray Place 2 sprays into both nostrils daily. 16 g 1   ipratropium (ATROVENT) 0.06 % nasal spray Place 1-2 sprays into both nostrils 4 (four) times daily. As needed for nasal congestion 15 mL 5   montelukast (SINGULAIR) 10 MG tablet Take 1 tablet (10 mg total) by mouth daily. 90 tablet 3   rosuvastatin (CRESTOR) 10 MG tablet Take 1 tablet (10 mg total) by mouth daily. 90 tablet 3   No current facility-administered medications for this visit.    Allergies as of 05/25/2022 - Review Complete 05/25/2022  Allergen Reaction Noted   Penicillins Rash     Vitals: BP (!) 154/75 (BP Location: Left Arm, Patient Position: Sitting, Cuff Size: Normal)   Pulse 88   Ht '5\' 11"'$  (1.803 m)   Wt 241 lb (109.3 kg)   BMI 33.61 kg/m  Last Weight:  Wt Readings from Last 1 Encounters:  05/25/22 241 lb (109.3 kg)   IRS:WNIO mass index is 33.61 kg/m.     Last Height:   Ht Readings from Last 1 Encounters:  05/25/22 '5\' 11"'$  (1.803 m)    Physical exam:  General: The patient is awake, alert and appears not in acute distress. Facial hair.  Head: Normocephalic, atraumatic. Neck is supple.  Mallampati: 1- wide open!   neck circumference:1 8.5 inches .  Nasal airflow patent , Prognathia is seen.  Cardiovascular:  Regular rate and rhythm , without  murmurs or carotid bruit, and without distended neck veins. Respiratory: Lungs are clear to auscultation. Skin:  Without evidence of edema, or rash Trunk: BMI is 34.18. The patient's posture is erect   Neurologic exam : The patient is awake and alert, oriented to place and time.   Attention span & concentration  ability appears normal.  Speech is fluent, without dysarthria, dysphonia or aphasia.  Mood and affect are appropriate. Cranial nerves: Pupils are equal and briskly reactive to light.  Funduscopic exam without evidence of pallor or edema.  Hearing impaired- has hearing aids. .   Facial sensation intact to fine touch. Facial motor strength is symmetric and tongue and uvula move midline. Shoulder shrug was symmetrical.  Motor exam:  Normal tone, muscle bulk and symmetric strength in all extremities. Sensory:  Fine touch, pinprick and vibration were tested in all extremities. Proprioception tested in the upper extremities was normal. Coordination: Rapid alternating movements and finger-to-nose maneuver without  evidence of ataxia, dysmetria or tremor. Gait and station: Patient walks without assistive device. Deep tendon reflexes: in the  upper and lower extremities are symmetric and intact.  Assessment:   After physical and neurologic examination, review of laboratory studies,  Personal review of imaging studies, reports of other /same  Imaging studies, results of polysomnography and / or neurophysiology testing and pre-existing records as far as provided in visit., my assessment is   1) George Garner has OSA- and is using CPAP faithfully, 100% compliant, CPAP was set at  7 cm water and is producing a residual AHI of 2.0/h.    ongoing risk factors for body mass index, his neck circumference and medical co-morbidities all post risk factors for the presence of obstructive sleep apnea -aside from snoring that his wife has observed at night when not using CPAP .   He is snoring, he does have crescendo snoring which is strongly associated with OSA. He has retrognathia.   The patient was advised of the nature of the diagnosed disorder , the treatment options and the  risks for general health and wellness arising from not treating the condition.   I spent more than 35 minutes of face to face time with the  patient.  Greater than 50% of time was spent in counseling and coordination of care. We have discussed the diagnosis and differential and I answered the patient's questions.    Plan:  Treatment plan and additional workup :  I will provide a script for new supplies for him today - he will continue to use his CPAP until April 2024 when he is due for a new HST and new machine.    [facility]- [phone]  I will invite George Garner for a sleep study when his machine is to be replaced. His machine is now not yet 66 years old. He would like to consider an inspire device.   He is currently excluded by BMI. He would be interested in a weight and wellness appointment if Dr Carlota Raspberry is in agreement.   He reports not feeling satiety, not feeling full.  Rv in  6-8 months.     Larey Seat, MD 62/10/5595, 4:16 PM  Certified in Neurology by ABPN Certified in [place] by [facility] [address] [place], [place] [address]

## 2022-10-07 ENCOUNTER — Ambulatory Visit (INDEPENDENT_AMBULATORY_CARE_PROVIDER_SITE_OTHER): Payer: Medicare Other | Admitting: Family Medicine

## 2022-10-07 ENCOUNTER — Encounter: Payer: Self-pay | Admitting: Family Medicine

## 2022-10-07 ENCOUNTER — Other Ambulatory Visit: Payer: Self-pay | Admitting: Family Medicine

## 2022-10-07 VITALS — BP 128/72 | HR 72 | Temp 98.0°F | Ht 71.5 in | Wt 254.4 lb

## 2022-10-07 DIAGNOSIS — E785 Hyperlipidemia, unspecified: Secondary | ICD-10-CM

## 2022-10-07 DIAGNOSIS — H6122 Impacted cerumen, left ear: Secondary | ICD-10-CM

## 2022-10-07 DIAGNOSIS — Z131 Encounter for screening for diabetes mellitus: Secondary | ICD-10-CM

## 2022-10-07 DIAGNOSIS — J302 Other seasonal allergic rhinitis: Secondary | ICD-10-CM

## 2022-10-07 DIAGNOSIS — J309 Allergic rhinitis, unspecified: Secondary | ICD-10-CM | POA: Diagnosis not present

## 2022-10-07 DIAGNOSIS — G4733 Obstructive sleep apnea (adult) (pediatric): Secondary | ICD-10-CM

## 2022-10-07 DIAGNOSIS — R739 Hyperglycemia, unspecified: Secondary | ICD-10-CM | POA: Diagnosis not present

## 2022-10-07 DIAGNOSIS — E669 Obesity, unspecified: Secondary | ICD-10-CM

## 2022-10-07 DIAGNOSIS — Z6834 Body mass index (BMI) 34.0-34.9, adult: Secondary | ICD-10-CM

## 2022-10-07 LAB — COMPREHENSIVE METABOLIC PANEL
ALT: 41 U/L (ref 0–53)
AST: 33 U/L (ref 0–37)
Albumin: 4.1 g/dL (ref 3.5–5.2)
Alkaline Phosphatase: 53 U/L (ref 39–117)
BUN: 16 mg/dL (ref 6–23)
CO2: 30 mEq/L (ref 19–32)
Calcium: 9.7 mg/dL (ref 8.4–10.5)
Chloride: 105 mEq/L (ref 96–112)
Creatinine, Ser: 1.07 mg/dL (ref 0.40–1.50)
GFR: 71.76 mL/min (ref >60.00)
Glucose, Bld: 80 mg/dL (ref 70–99)
Potassium: 4.6 mEq/L (ref 3.5–5.1)
Sodium: 141 mEq/L (ref 135–145)
Total Bilirubin: 0.7 mg/dL (ref 0.2–1.2)
Total Protein: 7.1 g/dL (ref 6.0–8.3)

## 2022-10-07 LAB — LIPID PANEL
Cholesterol: 137 mg/dL (ref 0–200)
HDL: 38.2 mg/dL — ABNORMAL LOW (ref >39.00)
LDL Cholesterol: 76 mg/dL (ref 0–99)
NonHDL: 98.52
Total CHOL/HDL Ratio: 4
Triglycerides: 115 mg/dL (ref 0.0–149.0)
VLDL: 23 mg/dL (ref 0.0–40.0)

## 2022-10-07 LAB — HEMOGLOBIN A1C: Hgb A1c MFr Bld: 5.7 % (ref 4.6–6.5)

## 2022-10-07 MED ORDER — MONTELUKAST SODIUM 10 MG PO TABS: 10.0000 mg | ORAL_TABLET | Freq: Every day | ORAL | 3 refills | Status: DC

## 2022-10-07 NOTE — Patient Instructions (Addendum)
Keep appointment with sleep specialist next month, but see info on insomnia below. Avoid caffeine after lunch for now.  No med changes at this time. If any lab concerns I will let you know. Take care.    Earwax Buildup, Adult The ears produce a substance called earwax that helps keep bacteria out of the ear and protects the skin in the ear canal. Occasionally, earwax can build up in the ear and cause discomfort or hearing loss. What are the causes? This condition is caused by a buildup of earwax. Ear canals are self-cleaning. Ear wax is made in the outer part of the ear canal and generally falls out in small amounts over time. When the self-cleaning mechanism is not working, earwax builds up and can cause decreased hearing and discomfort. Attempting to clean ears with cotton swabs can push the earwax deep into the ear canal and cause decreased hearing and pain. What increases the risk? This condition is more likely to develop in people who: Clean their ears often with cotton swabs. Pick at their ears. Use earplugs or in-ear headphones often, or wear hearing aids. The following factors may also make you more likely to develop this condition: Being male. Being of older age. Naturally producing more earwax. Having narrow ear canals. Having earwax that is overly thick or sticky. Having excess hair in the ear canal. Having eczema. Being dehydrated. What are the signs or symptoms? Symptoms of this condition include: Reduced or muffled hearing. A feeling of fullness in the ear or feeling that the ear is plugged. Fluid coming from the ear. Ear pain or an itchy ear. Ringing in the ear. Coughing. Balance problems. An obvious piece of earwax that can be seen inside the ear canal. How is this diagnosed? This condition may be diagnosed based on: Your symptoms. Your medical history. An ear exam. During the exam, your health care provider will look into your ear with an instrument called an  otoscope. You may have tests, including a hearing test. How is this treated? This condition may be treated by: Using ear drops to soften the earwax. Having the earwax removed by a health care provider. The health care provider may: Flush the ear with water. Use an instrument that has a loop on the end (curette). Use a suction device. Having surgery to remove the wax buildup. This may be done in severe cases. Follow these instructions at home:  Take over-the-counter and prescription medicines only as told by your health care provider. Do not put any objects, including cotton swabs, into your ear. You can clean the opening of your ear canal with a washcloth or facial tissue. Follow instructions from your health care provider about cleaning your ears. Do not overclean your ears. Drink enough fluid to keep your urine pale yellow. This will help to thin the earwax. Keep all follow-up visits as told. If earwax builds up in your ears often or if you use hearing aids, consider seeing your health care provider for routine, preventive ear cleanings. Ask your health care provider how often you should schedule your cleanings. If you have hearing aids, clean them according to instructions from the manufacturer and your health care provider. Contact a health care provider if: You have ear pain. You develop a fever. You have pus or other fluid coming from your ear. You have hearing loss. You have ringing in your ears that does not go away. You feel like the room is spinning (vertigo). Your symptoms do not improve with  treatment. Get help right away if: You have bleeding from the affected ear. You have severe ear pain. Summary Earwax can build up in the ear and cause discomfort or hearing loss. The most common symptoms of this condition include reduced or muffled hearing, a feeling of fullness in the ear, or feeling that the ear is plugged. This condition may be diagnosed based on your symptoms, your  medical history, and an ear exam. This condition may be treated by using ear drops to soften the earwax or by having the earwax removed by a health care provider. Do not put any objects, including cotton swabs, into your ear. You can clean the opening of your ear canal with a washcloth or facial tissue. This information is not intended to replace advice given to you by your health care provider. Make sure you discuss any questions you have with your health care provider. Document Revised: 11/06/2019 Document Reviewed: 11/06/2019 Elsevier Patient Education  [place].    Insomnia Insomnia is a sleep disorder that makes it difficult to fall asleep or stay asleep. Insomnia can cause fatigue, low energy, difficulty concentrating, mood swings, and poor performance at work or school. There are three different ways to classify insomnia: Difficulty falling asleep. Difficulty staying asleep. Waking up too early in the morning. Any type of insomnia can be long-term (chronic) or short-term (acute). Both are common. Short-term insomnia usually lasts for 3 months or less. Chronic insomnia occurs at least three times a week for longer than 3 months. What are the causes? Insomnia may be caused by another condition, situation, or substance, such as: Having certain mental health conditions, such as anxiety and depression. Using caffeine, alcohol, tobacco, or drugs. Having gastrointestinal conditions, such as gastroesophageal reflux disease (GERD). Having certain medical conditions. These include: Asthma. Alzheimer's disease. Stroke. Chronic pain. An overactive thyroid gland (hyperthyroidism). Other sleep disorders, such as restless legs syndrome and sleep apnea. Menopause. Sometimes, the cause of insomnia may not be known. What increases the risk? Risk factors for insomnia include: Gender. Females are affected more often than males. Age. Insomnia is more common as people get older. Stress  and certain medical and mental health conditions. Lack of exercise. Having an irregular work schedule. This may include working night shifts and traveling between different time zones. What are the signs or symptoms? If you have insomnia, the main symptom is having trouble falling asleep or having trouble staying asleep. This may lead to other symptoms, such as: Feeling tired or having low energy. Feeling nervous about going to sleep. Not feeling rested in the morning. Having trouble concentrating. Feeling irritable, anxious, or depressed. How is this diagnosed? This condition may be diagnosed based on: Your symptoms and medical history. Your health care provider may ask about: Your sleep habits. Any medical conditions you have. Your mental health. A physical exam. How is this treated? Treatment for insomnia depends on the cause. Treatment may focus on treating an underlying condition that is causing the insomnia. Treatment may also include: Medicines to help you sleep. Counseling or therapy. Lifestyle adjustments to help you sleep better. Follow these instructions at home: Eating and drinking  Limit or avoid alcohol, caffeinated beverages, and products that contain nicotine and tobacco, especially close to bedtime. These can disrupt your sleep. Do not eat a large meal or eat spicy foods right before bedtime. This can lead to digestive discomfort that can make it hard for you to sleep. Sleep habits  Keep a sleep diary to help  you and your health care provider figure out what could be causing your insomnia. Write down: When you sleep. When you wake up during the night. How well you sleep and how rested you feel the next day. Any side effects of medicines you are taking. What you eat and drink. Make your bedroom a dark, comfortable place where it is easy to fall asleep. Put up shades or blackout curtains to block light from outside. Use a white noise machine to block noise. Keep  the temperature cool. Limit screen use before bedtime. This includes: Not watching TV. Not using your smartphone, tablet, or computer. Stick to a routine that includes going to bed and waking up at the same times every day and night. This can help you fall asleep faster. Consider making a quiet activity, such as reading, part of your nighttime routine. Try to avoid taking naps during the day so that you sleep better at night. Get out of bed if you are still awake after 15 minutes of trying to sleep. Keep the lights down, but try reading or doing a quiet activity. When you feel sleepy, go back to bed. General instructions Take over-the-counter and prescription medicines only as told by your health care provider. Exercise regularly as told by your health care provider. However, avoid exercising in the hours right before bedtime. Use relaxation techniques to manage stress. Ask your health care provider to suggest some techniques that may work well for you. These may include: Breathing exercises. Routines to release muscle tension. Visualizing peaceful scenes. Make sure that you drive carefully. Do not drive if you feel very sleepy. Keep all follow-up visits. This is important. Contact a health care provider if: You are tired throughout the day. You have trouble in your daily routine due to sleepiness. You continue to have sleep problems, or your sleep problems get worse. Get help right away if: You have thoughts about hurting yourself or someone else. Get help right away if you feel like you may hurt yourself or others, or have thoughts about taking your own life. Go to your nearest emergency room or: Call 911. Call the [place] at [phone]) [phone] or 988. This is open 24 hours a day. Text the Crisis Text Line at 873 321 4445. Summary Insomnia is a sleep disorder that makes it difficult to fall asleep or stay asleep. Insomnia can be long-term (chronic) or short-term  (acute). Treatment for insomnia depends on the cause. Treatment may focus on treating an underlying condition that is causing the insomnia. Keep a sleep diary to help you and your health care provider figure out what could be causing your insomnia. This information is not intended to replace advice given to you by your health care provider. Make sure you discuss any questions you have with your health care provider. Document Revised: 06/29/2021 Document Reviewed: 06/29/2021 Elsevier Patient Education  [place].

## 2022-10-07 NOTE — Progress Notes (Signed)
Subjective:  Patient ID: Olan Loughnane, male    DOB: [birth year]  Age: 67 y.o. MRN: [id number]  CC:  Chief Complaint  Patient presents with   Hyperlipidemia   Hypertension   Sleep Apnea    HPI Nischal Och presents for Annual Exam No acute concerns. Doing well. No health changes other than weight gain.   Hyperlipidemia: Crestor 10 mg daily, borderline elevated ALT in July.  Plan for repeat labs today. No new myaligas/side effects.  Lab Results  Component Value Date   CHOL 130 03/01/2022   HDL 40 03/01/2022   LDLCALC 72 03/01/2022   TRIG 95 03/01/2022   CHOLHDL 3.3 03/01/2022   Lab Results  Component Value Date   ALT 45 (H) 03/01/2022   AST 35 03/01/2022   ALKPHOS 56 03/01/2022   BILITOT 0.5 03/01/2022   OSA on CPAP History of retrognathia, upper airway resistance syndrome.  Using CPAP nightly, but some trouble remaining asleep. No PND/orthopnea. Wakes up around 3 am. Trouble getting back to sleep. 5-7.5 hours sleep at times. No significant nocturia. Appt with sleep specialist in April. No snoring.  Some caffeine in late afternoon.  Allergic rhinitis Treated with Zyrtec, Flonase, Singulair  Atrovent nasal spray as needed only. Allergies doing well.   Obesity Weight has been increasing over the past few visits. Unsure of reason other than sleep and some portion control and timing of meals - some late night meals, snacks.  Fast food/sugar-containing beverages: rare fast food, infrequent sugar beverages, rare beer. Appt with [facility] [place] soon.  Exercise: yardwork, plans to increase walking with better weather.  Glucose 104 in 2022.   Body mass index is 34.99 kg/m. Wt Readings from Last 3 Encounters:  10/07/22 254 lb 6.4 oz (115.4 kg)  05/25/22 241 lb (109.3 kg)  04/21/22 237 lb 6.4 oz (107.7 kg)   Lab Results  Component Value Date   HGBA1C 5.5 10/01/2021         10/07/2022    8:42 AM 04/21/2022   10:50 AM 01/27/2022    2:42 PM 10/01/2021    8:09 AM  08/17/2021    1:12 PM  Depression screen PHQ 2/9  Decreased Interest 0 0 0 0 0  Down, Depressed, Hopeless 0 0 0 0 0  PHQ - 2 Score 0 0 0 0 0  Altered sleeping '1 2  2 1  '$ Tired, decreased energy 0 '1  1 2  '$ Change in appetite 0 0  0 0  Feeling bad or failure about yourself  0 0  0 0  Trouble concentrating 0 0  0 0  Moving slowly or fidgety/restless 0 0  0 0  Suicidal thoughts 0 0  0 0  PHQ-9 Score '1 3  3 3    '$ Health Maintenance  Topic Date Due   Medicare Annual Wellness (AWV)  10/02/2022   COVID-19 Vaccine (5 - 2023-24 season) 10/23/2022 (Originally 04/02/2022)   DTaP/Tdap/Td (3 - Td or Tdap) 07/02/2026   COLONOSCOPY (Pts 45-58yr Insurance coverage will need to be confirmed)  04/14/2028   Pneumonia Vaccine 67 Years old  Completed   INFLUENZA VACCINE  Completed   Hepatitis C Screening  Completed   Zoster Vaccines- Shingrix  Completed   HPV VACCINES  Aged Out  Covid infection 1 month ago - mild illness. No recent booster.  Still followed by urology, prostate surgery in 2021. Recent visit - doing well.  Colonoscopy 2019 - 10 year recheck.   Trouble hearing out  of left ear. Has hearing aids. No pain, no treatment,   Immunization History  Administered Date(s) Administered   Fluad Quad(high Dose 65+) 04/10/2021   Influenza Whole 04/29/2011   Influenza,inj,Quad PF,6+ Mos 05/13/2014, 06/23/2015, 07/02/2016, 08/12/2017, 05/26/2018, 04/04/2019   Influenza-Unspecified 05/01/2021   PFIZER Comirnaty(Gray Top)Covid-19 Tri-Sucrose Vaccine 09/21/2019, 10/15/2019, 05/09/2020, 02/13/2021   Pneumococcal Conjugate-13 08/29/2020   Pneumococcal Polysaccharide-23 10/01/2021   Tdap 06/08/2006, 07/02/2016   Zoster Recombinat (Shingrix) 02/13/2021, 06/05/2021   Zoster, Live 08/03/2011     History Patient Active Problem List   Diagnosis Date Noted   Prostate cancer ([place]) 01/10/2020   Fatigue 05/24/2018   Somnolence, daytime 05/24/2018   Obstructive sleep apnea treated with continuous positive  airway pressure (CPAP) 01/31/2018   Snoring 09/28/2017   UARS (upper airway resistance syndrome) 09/28/2017   Retrognathia 09/28/2017   Allergic rhinitis due to allergen 09/28/2017   Obesity (BMI 30.0-34.9) 09/28/2017   Chronic pain of left knee 02/09/2017   Other seasonal allergic rhinitis 05/24/2014   Dyslipidemia    HYPOGODADISM MALE    Past Medical History:  Diagnosis Date   Allergy    Zyrtec daily.  Flonase PRN.   BPH (benign prostatic hyperplasia)    Dyslipidemia    Erectile dysfunction    GERD (gastroesophageal reflux disease)    Hyperlipidemia    Hypogonadism male    Obstructive sleep apnea    OSA on CPAP    Sleep apnea    Past Surgical History:  Procedure Laterality Date   CHOLECYSTECTOMY     COLONOSCOPY     deviated septum-cyst     EYE SURGERY     L eye tumor benign.   GALLBLADDER SURGERY     INGUINAL HERNIA REPAIR N/A 01/10/2020   Procedure: HERNIA REPAIR UMBILICAL, ADULT;  Surgeon: Raynelle Bring, MD;  Location: [facility];  Service: Urology;  Laterality: N/A;   LYMPHADENECTOMY Bilateral 01/10/2020   Procedure: LYMPHADENECTOMY, PELVIC;  Surgeon: Raynelle Bring, MD;  Location: [facility];  Service: Urology;  Laterality: Bilateral;   PILONIDAL CYST EXCISION     PROSTATE SURGERY N/A    [employer] 08/26/2020   ROBOT ASSISTED LAPAROSCOPIC RADICAL PROSTATECTOMY N/A 01/10/2020   Procedure: XI ROBOTIC ASSISTED LAPAROSCOPIC RADICAL PROSTATECTOMY LEVEL 2;  Surgeon: Raynelle Bring, MD;  Location: [facility];  Service: Urology;  Laterality: N/A;   VASECTOMY     Allergies  Allergen Reactions   Penicillins Rash    Teen Years   Prior to Admission medications   Medication Sig Start Date End Date Taking? Authorizing Provider  aspirin EC 81 MG tablet Take 81 mg by mouth daily. Swallow whole.   Yes [provider]  cetirizine (ZYRTEC) 10 MG tablet Take 1 tablet (10 mg total) by mouth daily. Patient taking differently: Take 10 mg by mouth at bedtime. 12/10/16  Yes McVey, Gelene Mink, PA-C  fluticasone (FLONASE) 50 MCG/ACT nasal spray Place 2 sprays into both nostrils daily. 12/10/16  Yes McVey, Gelene Mink, PA-C  montelukast (SINGULAIR) 10 MG tablet Take 1 tablet (10 mg total) by mouth daily. 10/01/21  Yes Wendie Agreste, MD  Multiple Vitamin (MULTI-VITAMIN) tablet Take 1 tablet by mouth daily. 01/13/16  Yes [provider]  rosuvastatin (CRESTOR) 10 MG tablet Take 1 tablet (10 mg total) by mouth daily. 12/25/21 12/20/22 Yes Buford Dresser, MD  acetaminophen (TYLENOL) 500 MG tablet Take 500 mg by mouth every 6 (six) hours as needed for mild pain or headache.  Patient not taking: Reported on 10/07/2022  [provider]  ipratropium (ATROVENT) 0.06 % nasal spray Place 1-2 sprays into both nostrils 4 (four) times daily. As needed for nasal congestion Patient not taking: Reported on 10/07/2022 08/17/21   Wendie Agreste, MD   Social History   Socioeconomic History   Marital status: Married    Spouse name: Not on file   Number of children: Not on file   Years of education: Not on file   Highest education level: Not on file  Occupational History   Occupation: minister    Employer: [employer]  Tobacco Use   Smoking status: Former    Types: Cigarettes    Quit date: 2002    Years since quitting: 22.1   Smokeless tobacco: Never  Vaping Use   Vaping Use: Never used  Substance and Sexual Activity   Alcohol use: Yes    Alcohol/week: 14.0 standard drinks of alcohol    Types: 14 Cans of beer per week    Comment: 1-2 beers daily now hes retired   Drug use: No   Sexual activity: Yes    Birth control/protection: Other-see comments    Comment: vasectomy  Other Topics Concern   Not on file  Social History Narrative   Marital status: married x 26 years.      Children: 3 children (23, 21, 16); no grandchildren.      Lives: with wife, 1 child at home       Employment: [employer] x 13 years.       Tobacco: quit smoking 15 years ago.      Alcohol: rarely      Exercise:  3 times per week (weights, cardio).      Seatbelt: 100%      Guns: none   Social Determinants of Health   Financial Resource Strain: Low Risk  (10/28/2021)   Overall Financial Resource Strain (CARDIA)    Difficulty of Paying Living Expenses: Not hard at all  Food Insecurity: No Food Insecurity (10/28/2021)   Hunger Vital Sign    Worried About Running Out of Food in the Last Year: Never true    Ran Out of Food in the Last Year: Never true  Transportation Needs: No Transportation Needs (10/28/2021)   PRAPARE - Hydrologist (Medical): No    Lack of Transportation (Non-Medical): No  Physical Activity: Insufficiently Active (10/28/2021)   Exercise Vital Sign    Days of Exercise per Week: 4 days    Minutes of Exercise per Session: 30 min  Stress: Not on file  Social Connections: Not on file  Intimate Partner Violence: Not on file    Review of Systems  Constitutional:  Negative for fatigue and unexpected weight change.  HENT:  Positive for hearing loss (left).   Eyes:  Negative for visual disturbance.  Respiratory:  Negative for cough, chest tightness and shortness of breath.   Cardiovascular:  Negative for chest pain, palpitations and leg swelling.  Gastrointestinal:  Negative for abdominal pain and blood in stool.  Neurological:  Negative for dizziness, light-headedness and headaches.     Objective:   Vitals:   10/07/22 0845  BP: 128/72  Pulse: 72  Temp: 98 F (36.7 C)  TempSrc: Temporal  SpO2: 98%  Weight: 254 lb 6.4 oz (115.4 kg)  Height: 5' 11.5" (1.816 m)     Physical Exam Vitals reviewed.  Constitutional:      Appearance: He is well-developed.  HENT:  Head: Normocephalic and atraumatic.     Left Ear: There is impacted cerumen (dark brown. no canal edema/erythema.).  Neck:     Vascular: No carotid bruit or JVD.  Cardiovascular:     Rate and Rhythm: Normal  rate and regular rhythm.     Heart sounds: Normal heart sounds. No murmur heard. Pulmonary:     Effort: Pulmonary effort is normal.     Breath sounds: Normal breath sounds. No rales.  Musculoskeletal:     Right lower leg: No edema.     Left lower leg: No edema.  Skin:    General: Skin is warm and dry.  Neurological:     Mental Status: He is alert and oriented to person, place, and time.  Psychiatric:        Mood and Affect: Mood normal.     Risks (including but not limited to bleeding and infection, damage to TM), benefits, and alternatives discussed for cerumen lavage on left.  Verbal consent obtained after any questions were answered.  No complications.  Exam after lavage, resolution of cerumen impaction, canal clear, no tympanic membrane rupture, and improved hearing.   Assessment & Plan:  Jacks Mutti is a 67 y.o. male . Dyslipidemia - Plan: Comprehensive metabolic panel, Lipid panel  -  Stable, tolerating current regimen.  Continue same dose Crestor, labs pending  Class 1 obesity without serious comorbidity with body mass index (BMI) of 34.0 to 34.9 in adult, unspecified obesity type  -Increase activity plan, diet changes with avoiding large portions, late-night meals/snacks.  Check A1c given prior borderline hyperglycemia to screen for diabetes.  Screening for diabetes mellitus - Plan: Hemoglobin A1c  Allergic rhinitis, unspecified seasonality, unspecified trigger - Plan: montelukast (SINGULAIR) 10 MG tablet  -Overall stable, has Atrovent nasal spray if needed for flare, continue routine meds.  RTC precautions.  OSA on CPAP  -Tolerating CPAP, some early wakening as above.  Handout given on insomnia, avoidance of late-night meals, avoidance of afternoon caffeine, follow-up with sleep specialist as planned with RTC precautions.  Other seasonal allergic rhinitis As above  Hearing loss due to cerumen impaction, left - Plan: Ear wax removal  -Lavage as above without  complications, hearing restored.    Hyperglycemia - Plan: Hemoglobin A1c As above  Meds ordered this encounter  Medications   montelukast (SINGULAIR) 10 MG tablet    Sig: Take 1 tablet (10 mg total) by mouth daily.    Dispense:  90 tablet    Refill:  3   Patient Instructions  Keep appointment with sleep specialist next month, but see info on insomnia below. Avoid caffeine after lunch for now.  No med changes at this time. If any lab concerns I will let you know. Take care.    Earwax Buildup, Adult The ears produce a substance called earwax that helps keep bacteria out of the ear and protects the skin in the ear canal. Occasionally, earwax can build up in the ear and cause discomfort or hearing loss. What are the causes? This condition is caused by a buildup of earwax. Ear canals are self-cleaning. Ear wax is made in the outer part of the ear canal and generally falls out in small amounts over time. When the self-cleaning mechanism is not working, earwax builds up and can cause decreased hearing and discomfort. Attempting to clean ears with cotton swabs can push the earwax deep into the ear canal and cause decreased hearing and pain. What increases the risk? This condition is more  likely to develop in people who: Clean their ears often with cotton swabs. Pick at their ears. Use earplugs or in-ear headphones often, or wear hearing aids. The following factors may also make you more likely to develop this condition: Being male. Being of older age. Naturally producing more earwax. Having narrow ear canals. Having earwax that is overly thick or sticky. Having excess hair in the ear canal. Having eczema. Being dehydrated. What are the signs or symptoms? Symptoms of this condition include: Reduced or muffled hearing. A feeling of fullness in the ear or feeling that the ear is plugged. Fluid coming from the ear. Ear pain or an itchy ear. Ringing in the ear. Coughing. Balance  problems. An obvious piece of earwax that can be seen inside the ear canal. How is this diagnosed? This condition may be diagnosed based on: Your symptoms. Your medical history. An ear exam. During the exam, your health care provider will look into your ear with an instrument called an otoscope. You may have tests, including a hearing test. How is this treated? This condition may be treated by: Using ear drops to soften the earwax. Having the earwax removed by a health care provider. The health care provider may: Flush the ear with water. Use an instrument that has a loop on the end (curette). Use a suction device. Having surgery to remove the wax buildup. This may be done in severe cases. Follow these instructions at home:  Take over-the-counter and prescription medicines only as told by your health care provider. Do not put any objects, including cotton swabs, into your ear. You can clean the opening of your ear canal with a washcloth or facial tissue. Follow instructions from your health care provider about cleaning your ears. Do not overclean your ears. Drink enough fluid to keep your urine pale yellow. This will help to thin the earwax. Keep all follow-up visits as told. If earwax builds up in your ears often or if you use hearing aids, consider seeing your health care provider for routine, preventive ear cleanings. Ask your health care provider how often you should schedule your cleanings. If you have hearing aids, clean them according to instructions from the manufacturer and your health care provider. Contact a health care provider if: You have ear pain. You develop a fever. You have pus or other fluid coming from your ear. You have hearing loss. You have ringing in your ears that does not go away. You feel like the room is spinning (vertigo). Your symptoms do not improve with treatment. Get help right away if: You have bleeding from the affected ear. You have severe ear  pain. Summary Earwax can build up in the ear and cause discomfort or hearing loss. The most common symptoms of this condition include reduced or muffled hearing, a feeling of fullness in the ear, or feeling that the ear is plugged. This condition may be diagnosed based on your symptoms, your medical history, and an ear exam. This condition may be treated by using ear drops to soften the earwax or by having the earwax removed by a health care provider. Do not put any objects, including cotton swabs, into your ear. You can clean the opening of your ear canal with a washcloth or facial tissue. This information is not intended to replace advice given to you by your health care provider. Make sure you discuss any questions you have with your health care provider. Document Revised: 11/06/2019 Document Reviewed: 11/06/2019 Elsevier Patient Education  2023  [place].    Insomnia Insomnia is a sleep disorder that makes it difficult to fall asleep or stay asleep. Insomnia can cause fatigue, low energy, difficulty concentrating, mood swings, and poor performance at work or school. There are three different ways to classify insomnia: Difficulty falling asleep. Difficulty staying asleep. Waking up too early in the morning. Any type of insomnia can be long-term (chronic) or short-term (acute). Both are common. Short-term insomnia usually lasts for 3 months or less. Chronic insomnia occurs at least three times a week for longer than 3 months. What are the causes? Insomnia may be caused by another condition, situation, or substance, such as: Having certain mental health conditions, such as anxiety and depression. Using caffeine, alcohol, tobacco, or drugs. Having gastrointestinal conditions, such as gastroesophageal reflux disease (GERD). Having certain medical conditions. These include: Asthma. Alzheimer's disease. Stroke. Chronic pain. An overactive thyroid gland (hyperthyroidism). Other sleep  disorders, such as restless legs syndrome and sleep apnea. Menopause. Sometimes, the cause of insomnia may not be known. What increases the risk? Risk factors for insomnia include: Gender. Females are affected more often than males. Age. Insomnia is more common as people get older. Stress and certain medical and mental health conditions. Lack of exercise. Having an irregular work schedule. This may include working night shifts and traveling between different time zones. What are the signs or symptoms? If you have insomnia, the main symptom is having trouble falling asleep or having trouble staying asleep. This may lead to other symptoms, such as: Feeling tired or having low energy. Feeling nervous about going to sleep. Not feeling rested in the morning. Having trouble concentrating. Feeling irritable, anxious, or depressed. How is this diagnosed? This condition may be diagnosed based on: Your symptoms and medical history. Your health care provider may ask about: Your sleep habits. Any medical conditions you have. Your mental health. A physical exam. How is this treated? Treatment for insomnia depends on the cause. Treatment may focus on treating an underlying condition that is causing the insomnia. Treatment may also include: Medicines to help you sleep. Counseling or therapy. Lifestyle adjustments to help you sleep better. Follow these instructions at home: Eating and drinking  Limit or avoid alcohol, caffeinated beverages, and products that contain nicotine and tobacco, especially close to bedtime. These can disrupt your sleep. Do not eat a large meal or eat spicy foods right before bedtime. This can lead to digestive discomfort that can make it hard for you to sleep. Sleep habits  Keep a sleep diary to help you and your health care provider figure out what could be causing your insomnia. Write down: When you sleep. When you wake up during the night. How well you sleep and  how rested you feel the next day. Any side effects of medicines you are taking. What you eat and drink. Make your bedroom a dark, comfortable place where it is easy to fall asleep. Put up shades or blackout curtains to block light from outside. Use a white noise machine to block noise. Keep the temperature cool. Limit screen use before bedtime. This includes: Not watching TV. Not using your smartphone, tablet, or computer. Stick to a routine that includes going to bed and waking up at the same times every day and night. This can help you fall asleep faster. Consider making a quiet activity, such as reading, part of your nighttime routine. Try to avoid taking naps during the day so that you sleep better at night. Get out of bed  if you are still awake after 15 minutes of trying to sleep. Keep the lights down, but try reading or doing a quiet activity. When you feel sleepy, go back to bed. General instructions Take over-the-counter and prescription medicines only as told by your health care provider. Exercise regularly as told by your health care provider. However, avoid exercising in the hours right before bedtime. Use relaxation techniques to manage stress. Ask your health care provider to suggest some techniques that may work well for you. These may include: Breathing exercises. Routines to release muscle tension. Visualizing peaceful scenes. Make sure that you drive carefully. Do not drive if you feel very sleepy. Keep all follow-up visits. This is important. Contact a health care provider if: You are tired throughout the day. You have trouble in your daily routine due to sleepiness. You continue to have sleep problems, or your sleep problems get worse. Get help right away if: You have thoughts about hurting yourself or someone else. Get help right away if you feel like you may hurt yourself or others, or have thoughts about taking your own life. Go to your nearest emergency room  or: Call 911. Call the [facility] at [phone] or 988. This is open 24 hours a day. Text the Crisis Text Line at 308-189-2314. Summary Insomnia is a sleep disorder that makes it difficult to fall asleep or stay asleep. Insomnia can be long-term (chronic) or short-term (acute). Treatment for insomnia depends on the cause. Treatment may focus on treating an underlying condition that is causing the insomnia. Keep a sleep diary to help you and your health care provider figure out what could be causing your insomnia. This information is not intended to replace advice given to you by your health care provider. Make sure you discuss any questions you have with your health care provider. Document Revised: 06/29/2021 Document Reviewed: 06/29/2021 Elsevier Patient Education  2023 [place],   Merri Ray, MD [place] 10/07/22 12:30 PM

## 2022-10-18 ENCOUNTER — Encounter (HOSPITAL_BASED_OUTPATIENT_CLINIC_OR_DEPARTMENT_OTHER): Payer: Self-pay

## 2022-10-18 MED ORDER — ROSUVASTATIN CALCIUM 10 MG PO TABS: 10.0000 mg | ORAL_TABLET | Freq: Every day | ORAL | 3 refills | Status: DC

## 2022-10-21 ENCOUNTER — Telehealth: Payer: Self-pay | Admitting: Family Medicine

## 2022-10-21 NOTE — Telephone Encounter (Signed)
Contacted Arley [facility] to schedule their annual wellness visit. Appointment made for 10/27/2022.  Thank you,  [place] Direct dial  [phone]

## 2022-10-21 NOTE — Telephone Encounter (Signed)
Called patient to schedule Medicare Annual Wellness Visit (AWV). Left message for patient to call back and schedule Medicare Annual Wellness Visit (AWV).  Last date of AWV: Welcome to Medicare: 10/01/2021   Please schedule an AWVI appointment at any time with [facility].  If any questions, please contact me at [phone].    Thank you,  [place] Direct dial  [phone]

## 2022-10-27 ENCOUNTER — Ambulatory Visit (INDEPENDENT_AMBULATORY_CARE_PROVIDER_SITE_OTHER): Payer: Medicare Other | Admitting: Family Medicine

## 2022-10-27 DIAGNOSIS — Z0000 Encounter for general adult medical examination without abnormal findings: Secondary | ICD-10-CM | POA: Diagnosis not present

## 2022-10-27 NOTE — Patient Instructions (Signed)
George Garner , Thank you for taking time to come for your Medicare Wellness Visit. I appreciate your ongoing commitment to your health goals. Please review the following plan we discussed and let me know if I can assist you in the future.   Screening recommendations/referrals: Colonoscopy: up to date Recommended yearly ophthalmology/optometry visit for glaucoma screening and checkup Recommended yearly dental visit for hygiene and checkup  Vaccinations: Influenza vaccine: up to date Pneumococcal vaccine: up to date Tdap vaccine: up to date Shingles vaccine: up to date    Advanced directives: information provided  Conditions/risks identified:     Preventive Care 68 Years and Older, Male Preventive care refers to lifestyle choices and visits with your health care provider that can promote health and wellness. What does preventive care include? A yearly physical exam. This is also called an annual well check. Dental exams once or twice a year. Routine eye exams. Ask your health care provider how often you should have your eyes checked. Personal lifestyle choices, including: Daily care of your teeth and gums. Regular physical activity. Eating a healthy diet. Avoiding tobacco and drug use. Limiting alcohol use. Practicing safe sex. Taking low doses of aspirin every day. Taking vitamin and mineral supplements as recommended by your health care provider. What happens during an annual well check? The services and screenings done by your health care provider during your annual well check will depend on your age, overall health, lifestyle risk factors, and family history of disease. Counseling  Your health care provider may ask you questions about your: Alcohol use. Tobacco use. Drug use. Emotional well-being. Home and relationship well-being. Sexual activity. Eating habits. History of falls. Memory and ability to understand (cognition). Work and work Statistician. Screening  You may  have the following tests or measurements: Height, weight, and BMI. Blood pressure. Lipid and cholesterol levels. These may be checked every 5 years, or more frequently if you are over 53 years old. Skin check. Lung cancer screening. You may have this screening every year starting at age 58 if you have a 30-pack-year history of smoking and currently smoke or have quit within the past 15 years. Fecal occult blood test (FOBT) of the stool. You may have this test every year starting at age 75. Flexible sigmoidoscopy or colonoscopy. You may have a sigmoidoscopy every 5 years or a colonoscopy every 10 years starting at age 39. Prostate cancer screening. Recommendations will vary depending on your family history and other risks. Hepatitis C blood test. Hepatitis B blood test. Sexually transmitted disease (STD) testing. Diabetes screening. This is done by checking your blood sugar (glucose) after you have not eaten for a while (fasting). You may have this done every 1-3 years. Abdominal aortic aneurysm (AAA) screening. You may need this if you are a current or former smoker. Osteoporosis. You may be screened starting at age 55 if you are at high risk. Talk with your health care provider about your test results, treatment options, and if necessary, the need for more tests. Vaccines  Your health care provider may recommend certain vaccines, such as: Influenza vaccine. This is recommended every year. Tetanus, diphtheria, and acellular pertussis (Tdap, Td) vaccine. You may need a Td booster every 10 years. Zoster vaccine. You may need this after age 29. Pneumococcal 13-valent conjugate (PCV13) vaccine. One dose is recommended after age 89. Pneumococcal polysaccharide (PPSV23) vaccine. One dose is recommended after age 14. Talk to your health care provider about which screenings and vaccines you need and how  often you need them. This information is not intended to replace advice given to you by your  health care provider. Make sure you discuss any questions you have with your health care provider. Document Released: 08/15/2015 Document Revised: 04/07/2016 Document Reviewed: 05/20/2015 Elsevier Interactive Patient Education  2017 [place] Prevention in the Home Falls can cause injuries. They can happen to people of all ages. There are many things you can do to make your home safe and to help prevent falls. What can I do on the outside of my home? Regularly fix the edges of walkways and driveways and fix any cracks. Remove anything that might make you trip as you walk through a door, such as a raised step or threshold. Trim any bushes or trees on the path to your home. Use bright outdoor lighting. Clear any walking paths of anything that might make someone trip, such as rocks or tools. Regularly check to see if handrails are loose or broken. Make sure that both sides of any steps have handrails. Any raised decks and porches should have guardrails on the edges. Have any leaves, snow, or ice cleared regularly. Use sand or salt on walking paths during winter. Clean up any spills in your garage right away. This includes oil or grease spills. What can I do in the bathroom? Use night lights. Install grab bars by the toilet and in the tub and shower. Do not use towel bars as grab bars. Use non-skid mats or decals in the tub or shower. If you need to sit down in the shower, use a plastic, non-slip stool. Keep the floor dry. Clean up any water that spills on the floor as soon as it happens. Remove soap buildup in the tub or shower regularly. Attach bath mats securely with double-sided non-slip rug tape. Do not have throw rugs and other things on the floor that can make you trip. What can I do in the bedroom? Use night lights. Make sure that you have a light by your bed that is easy to reach. Do not use any sheets or blankets that are too big for your bed. They should not hang down  onto the floor. Have a firm chair that has side arms. You can use this for support while you get dressed. Do not have throw rugs and other things on the floor that can make you trip. What can I do in the kitchen? Clean up any spills right away. Avoid walking on wet floors. Keep items that you use a lot in easy-to-reach places. If you need to reach something above you, use a strong step stool that has a grab bar. Keep electrical cords out of the way. Do not use floor polish or wax that makes floors slippery. If you must use wax, use non-skid floor wax. Do not have throw rugs and other things on the floor that can make you trip. What can I do with my stairs? Do not leave any items on the stairs. Make sure that there are handrails on both sides of the stairs and use them. Fix handrails that are broken or loose. Make sure that handrails are as long as the stairways. Check any carpeting to make sure that it is firmly attached to the stairs. Fix any carpet that is loose or worn. Avoid having throw rugs at the top or bottom of the stairs. If you do have throw rugs, attach them to the floor with carpet tape. Make sure that you have a  light switch at the top of the stairs and the bottom of the stairs. If you do not have them, ask someone to add them for you. What else can I do to help prevent falls? Wear shoes that: Do not have high heels. Have rubber bottoms. Are comfortable and fit you well. Are closed at the toe. Do not wear sandals. If you use a stepladder: Make sure that it is fully opened. Do not climb a closed stepladder. Make sure that both sides of the stepladder are locked into place. Ask someone to hold it for you, if possible. Clearly mark and make sure that you can see: Any grab bars or handrails. First and last steps. Where the edge of each step is. Use tools that help you move around (mobility aids) if they are needed. These include: Canes. Walkers. Scooters. Crutches. Turn  on the lights when you go into a dark area. Replace any light bulbs as soon as they burn out. Set up your furniture so you have a clear path. Avoid moving your furniture around. If any of your floors are uneven, fix them. If there are any pets around you, be aware of where they are. Review your medicines with your doctor. Some medicines can make you feel dizzy. This can increase your chance of falling. Ask your doctor what other things that you can do to help prevent falls. This information is not intended to replace advice given to you by your health care provider. Make sure you discuss any questions you have with your health care provider. Document Released: 05/15/2009 Document Revised: 12/25/2015 Document Reviewed: 08/23/2014 Elsevier Interactive Patient Education  2017 [employer].

## 2022-10-27 NOTE — Progress Notes (Signed)
Subjective:   George Garner is a 67 y.o. male who presents for an Initial Medicare Annual Wellness Visit.  I connected with  Arley Phenix on 10/27/22 by a  telephone enabled telemedicine application and verified that I am speaking with the correct person using two identifiers.   I discussed the limitations of evaluation and management by telemedicine. The patient expressed understanding and agreed to proceed.  Patient location: home  Provider location: in home telephone    Review of Systems     Cardiac Risk Factors include: advanced age (>49men, >41 women);male gender     Objective:    Today's Vitals   There is no height or weight on file to calculate BMI.     10/27/2022    8:08 AM 08/29/2020    8:08 AM 01/10/2020    3:00 PM 01/02/2020    8:18 AM 09/01/2015    8:02 AM  Advanced Directives  Does Patient Have a Medical Advance Directive? Yes Yes Yes Yes No  Type of Paramedic of [place];Living will [place];Living will [place];Living will   Does patient want to make changes to medical advance directive?   No - Guardian declined No - Patient declined   Copy of [place] in Chart? No - copy requested No - copy requested     Would patient like information on creating a medical advance directive?   No - Patient declined No - Patient declined Yes - Educational materials given    Current Medications (verified) Outpatient Encounter Medications as of 10/27/2022  Medication Sig   aspirin EC 81 MG tablet Take 81 mg by mouth daily. Swallow whole.   fluticasone (FLONASE) 50 MCG/ACT nasal spray Place 2 sprays into both nostrils daily.   ipratropium (ATROVENT) 0.06 % nasal spray Place 1-2 sprays into both nostrils 4 (four) times daily. As needed for nasal congestion   montelukast (SINGULAIR) 10 MG tablet Take 1 tablet (10 mg total) by mouth daily.   Multiple Vitamin  (MULTI-VITAMIN) tablet Take 1 tablet by mouth daily.   rosuvastatin (CRESTOR) 10 MG tablet Take 1 tablet (10 mg total) by mouth daily.   acetaminophen (TYLENOL) 500 MG tablet Take 500 mg by mouth every 6 (six) hours as needed for mild pain or headache.  (Patient not taking: Reported on 10/07/2022)   cetirizine (ZYRTEC) 10 MG tablet Take 1 tablet (10 mg total) by mouth daily. (Patient taking differently: Take 10 mg by mouth at bedtime.)   No facility-administered encounter medications on file as of 10/27/2022.    Allergies (verified) Penicillins   History: Past Medical History:  Diagnosis Date   Allergy    Zyrtec daily.  Flonase PRN.   BPH (benign prostatic hyperplasia)    Dyslipidemia    Erectile dysfunction    GERD (gastroesophageal reflux disease)    Hyperlipidemia    Hypogonadism male    Obstructive sleep apnea    OSA on CPAP    Sleep apnea    Past Surgical History:  Procedure Laterality Date   CHOLECYSTECTOMY     COLONOSCOPY     deviated septum-cyst     EYE SURGERY     L eye tumor benign.   GALLBLADDER SURGERY     INGUINAL HERNIA REPAIR N/A 01/10/2020   Procedure: HERNIA REPAIR UMBILICAL, ADULT;  Surgeon: Raynelle Bring, MD;  Location: [facility];  Service: Urology;  Laterality: N/A;   LYMPHADENECTOMY Bilateral 01/10/2020  Procedure: LYMPHADENECTOMY, PELVIC;  Surgeon: Raynelle Bring, MD;  Location: [facility];  Service: Urology;  Laterality: Bilateral;   PILONIDAL CYST EXCISION     PROSTATE SURGERY N/A    [employer] 08/26/2020   ROBOT ASSISTED LAPAROSCOPIC RADICAL PROSTATECTOMY N/A 01/10/2020   Procedure: XI ROBOTIC ASSISTED LAPAROSCOPIC RADICAL PROSTATECTOMY LEVEL 2;  Surgeon: Raynelle Bring, MD;  Location: [facility];  Service: Urology;  Laterality: N/A;   VASECTOMY     Family History  Problem Relation Age of Onset   Diabetes Mother    Dementia Mother    Arthritis Brother    Colon cancer Neg Hx    Colon polyps Neg Hx    Esophageal cancer Neg Hx    Stomach cancer Neg Hx     Rectal cancer Neg Hx    Social History   Socioeconomic History   Marital status: Married    Spouse name: Not on file   Number of children: Not on file   Years of education: Not on file   Highest education level: Not on file  Occupational History   Occupation: minister    Employer: [employer]  Tobacco Use   Smoking status: Former    Types: Cigarettes    Quit date: 2002    Years since quitting: 22.2   Smokeless tobacco: Never  Vaping Use   Vaping Use: Never used  Substance and Sexual Activity   Alcohol use: Yes    Alcohol/week: 14.0 standard drinks of alcohol    Types: 14 Cans of beer per week    Comment: 1-2 beers daily now hes retired   Drug use: No   Sexual activity: Yes    Comment: vasectomy  Other Topics Concern   Not on file  Social History Narrative   Marital status: married x 26 years.      Children: 3 children (23, 21, 16); no grandchildren.      Lives: with wife, 1 child at home       Employment: [employer] x 13 years.      Tobacco: quit smoking 15 years ago.      Alcohol: rarely      Exercise:  3 times per week (weights, cardio).      Seatbelt: 100%      Guns: none   Social Determinants of Health   Financial Resource Strain: Low Risk  (10/27/2022)   Overall Financial Resource Strain (CARDIA)    Difficulty of Paying Living Expenses: Not hard at all  Food Insecurity: No Food Insecurity (10/27/2022)   Hunger Vital Sign    Worried About Running Out of Food in the Last Year: Never true    Ran Out of Food in the Last Year: Never true  Transportation Needs: No Transportation Needs (10/27/2022)   PRAPARE - Hydrologist (Medical): No    Lack of Transportation (Non-Medical): No  Physical Activity: Insufficiently Active (10/27/2022)   Exercise Vital Sign    Days of Exercise per Week: 3 days    Minutes of Exercise per Session: 20 min  Stress: No Stress Concern Present (10/27/2022)   [place]    Feeling of Stress : Not at all  Social Connections: [place] (10/27/2022)   Social Connection and Isolation Panel [NHANES]    Frequency of Communication with Friends and Family: Twice a week    Frequency of Social Gatherings with Friends and Family: Twice a week  Attends Religious Services: More than 4 times per year    Active Member of Clubs or Organizations: Yes    Attends Music therapist: More than 4 times per year    Marital Status: Married    Tobacco Counseling Counseling given: Not Answered   Clinical Intake:  Pre-visit preparation completed: Yes  Pain : No/denies pain     Nutritional Risks: None Diabetes: No  How often do you need to have someone help you when you read instructions, pamphlets, or other written materials from your doctor or pharmacy?: 1 - Never  Diabetic? no  Interpreter Needed?: No  Information entered by :: Leroy Kennedy LPN   Activities of Daily Living    10/27/2022    8:09 AM  In your present state of health, do you have any difficulty performing the following activities:  Hearing? 1  Vision? 0  Difficulty concentrating or making decisions? 0  Walking or climbing stairs? 0  Dressing or bathing? 0  Doing errands, shopping? 0  Preparing Food and eating ? N  Using the Toilet? N  In the past six months, have you accidently leaked urine? N  Do you have problems with loss of bowel control? N  Managing your Medications? N  Managing your Finances? N  Housekeeping or managing your Housekeeping? N    Patient Care Team: Wendie Agreste, MD as PCP - General (Family Medicine) Buford Dresser, MD as PCP - Cardiology (Cardiology)  Indicate any recent Medical Services you may have received from other than [facility] providers in the past year (date may be approximate).     Assessment:   This is a routine wellness examination for Alvia.  Hearing/Vision  screen Hearing Screening - Comments:: Bilateral hearing aids Vision Screening - Comments:: Up to date [facility]  Dietary issues and exercise activities discussed: Current Exercise Habits: Home exercise routine, Type of exercise: walking, Time (Minutes): 20, Frequency (Times/Week): 4, Weekly Exercise (Minutes/Week): 80, Intensity: Mild   Goals Addressed             This Visit's Progress    Weight (lb) < 200 lb (90.7 kg)         Depression Screen    10/27/2022    8:06 AM 10/07/2022    8:42 AM 04/21/2022   10:50 AM 01/27/2022    2:42 PM 10/01/2021    8:09 AM 08/17/2021    1:12 PM 04/10/2021   10:32 AM  PHQ 2/9 Scores  PHQ - 2 Score 0 0 0 0 0 0 0  PHQ- 9 Score 2 1 3  3 3 2     Fall Risk    10/27/2022    8:16 AM 10/07/2022    8:41 AM 04/21/2022   10:50 AM 01/27/2022    2:41 PM 10/01/2021    8:08 AM  Fall Risk   Falls in the past year? 0 0 0 0 0  Number falls in past yr: 0 0 0 0 0  Injury with Fall? 0 0 0 0 0  Risk for fall due to :  No Fall Risks No Fall Risks No Fall Risks No Fall Risks  Follow up Falls evaluation completed;Education provided;Falls prevention discussed Falls evaluation completed Falls evaluation completed Falls evaluation completed Falls evaluation completed    FALL RISK PREVENTION PERTAINING TO THE HOME:  Any stairs in or around the home? Yes  If so, are there any without handrails? No  Home free of loose throw rugs in walkways, pet beds, electrical cords,  etc? Yes  Adequate lighting in your home to reduce risk of falls? Yes   ASSISTIVE DEVICES UTILIZED TO PREVENT FALLS:  Life alert? No  Use of a cane, walker or w/c? No  Grab bars in the bathroom? Yes  Shower chair or bench in shower? No  Elevated toilet seat or a handicapped toilet? No   TIMED UP AND GO:  Was the test performed? No .    Cognitive Function:        10/27/2022    8:07 AM 10/01/2021    8:06 AM 08/29/2020    8:05 AM  6CIT Screen  What Year? 0 points 0 points 0 points  What month?  0 points 0 points 0 points  What time? 0 points 0 points 0 points  Count back from 20 0 points 0 points 0 points  Months in reverse 0 points 0 points 0 points  Repeat phrase 0 points 0 points 0 points  Total Score 0 points 0 points 0 points    Immunizations Immunization History  Administered Date(s) Administered   Fluad Quad(high Dose 65+) 04/10/2021   Influenza Whole 04/29/2011   Influenza,inj,Quad PF,6+ Mos 05/13/2014, 06/23/2015, 07/02/2016, 08/12/2017, 05/26/2018, 04/04/2019   Influenza-Unspecified 05/01/2021   PFIZER Comirnaty(Gray Top)Covid-19 Tri-Sucrose Vaccine 09/21/2019, 10/15/2019, 05/09/2020, 02/13/2021   Pneumococcal Conjugate-13 08/29/2020   Pneumococcal Polysaccharide-23 10/01/2021   Tdap 06/08/2006, 07/02/2016   Zoster Recombinat (Shingrix) 02/13/2021, 06/05/2021   Zoster, Live 08/03/2011    TDAP status: Up to date  Flu Vaccine status: Up to date  Pneumococcal vaccine status: Up to date  Covid-19 vaccine status: Information provided on how to obtain vaccines.   Qualifies for Shingles Vaccine? No   Zostavax completed Yes   Shingrix Completed?: Yes  Screening Tests Health Maintenance  Topic Date Due   COVID-19 Vaccine (5 - 2023-24 season) 11/12/2022 (Originally 04/02/2022)   Medicare Annual Wellness (AWV)  10/27/2023   DTaP/Tdap/Td (3 - Td or Tdap) 07/02/2026   COLONOSCOPY (Pts 45-25yrs Insurance coverage will need to be confirmed)  04/14/2028   Pneumonia Vaccine 77+ Years old  Completed   INFLUENZA VACCINE  Completed   Hepatitis C Screening  Completed   Zoster Vaccines- Shingrix  Completed   HPV VACCINES  Aged Out    Health Maintenance  There are no preventive care reminders to display for this patient.   Colorectal cancer screening: Type of screening: Colonoscopy. Completed 2019. Repeat every 10 years  Lung Cancer Screening: (Low Dose CT Chest recommended if Age 78-80 years, 30 pack-year currently smoking OR have quit w/in 15years.) does not  qualify.   Lung Cancer Screening Referral:   Additional Screening:  Hepatitis C Screening: does not qualify; Completed 2019  Vision Screening: Recommended annual ophthalmology exams for early detection of glaucoma and other disorders of the eye. Is the patient up to date with their annual eye exam?  Yes  Who is the provider or what is the name of the office in which the patient attends annual eye exams? [facility]  If pt is not established with a provider, would they like to be referred to a provider to establish care? No .   Dental Screening: Recommended annual dental exams for proper oral hygiene  Community Resource Referral / Chronic Care Management: CRR required this visit?  No   CCM required this visit?  No      Plan:     I have personally reviewed and noted the following in the patient's chart:   Medical and social history  Use of alcohol, tobacco or illicit drugs  Current medications and supplements including opioid prescriptions. Patient is not currently taking opioid prescriptions. Functional ability and status Nutritional status Physical activity Advanced directives List of other physicians Hospitalizations, surgeries, and ER visits in previous 12 months Vitals Screenings to include cognitive, depression, and falls Referrals and appointments  In addition, I have reviewed and discussed with patient certain preventive protocols, quality metrics, and best practice recommendations. A written personalized care plan for preventive services as well as general preventive health recommendations were provided to patient.     Leroy Kennedy, LPN   [id number]   Nurse Notes:

## 2022-11-02 NOTE — Progress Notes (Signed)
Note reviewed, and agree with documentation and plan. I was available in office during the time of this wellness visit, or available by phone or text if needed and available for questions or concerns regarding visit or follow-up issues to be addressed.  Signed,   Merri Ray, MD [place] 11/02/22 8:59 PM

## 2022-11-17 ENCOUNTER — Ambulatory Visit (INDEPENDENT_AMBULATORY_CARE_PROVIDER_SITE_OTHER): Payer: Medicare Other | Admitting: Family Medicine

## 2022-11-17 VITALS — BP 136/74 | HR 84 | Temp 98.6°F | Ht 71.5 in | Wt 257.4 lb

## 2022-11-17 DIAGNOSIS — T162XXA Foreign body in left ear, initial encounter: Secondary | ICD-10-CM

## 2022-11-17 DIAGNOSIS — H9202 Otalgia, left ear: Secondary | ICD-10-CM | POA: Diagnosis not present

## 2022-11-17 DIAGNOSIS — H60502 Unspecified acute noninfective otitis externa, left ear: Secondary | ICD-10-CM

## 2022-11-17 MED ORDER — OFLOXACIN 0.3 % OT SOLN: 10.0000 [drp] | Freq: Every day | OTIC | 0 refills | Status: DC

## 2022-11-17 NOTE — Patient Instructions (Signed)
Start eardrops for possible early swimmer's ear/external ear infection.  Fortunately based on the location of the wax guard, I am concerned that may not be able to safely remove that from our office.  I will refer you to ear nose and throat hopefully in the next few days to have that evaluated and removed.  Return to the clinic or go to the nearest emergency room if any of your symptoms worsen or new symptoms occur.

## 2022-11-17 NOTE — Progress Notes (Signed)
Subjective:  Patient ID: George Garner, male    DOB: [birth year]  Age: 67 y.o. MRN: [id number]  CC:  Chief Complaint  Patient presents with   Ear Pain    Pt lost the tip of his hearing aid in his Lt ear about 2 weeks ago, didn't thin it was in there but exam shows the ear piece close to the opening     HPI George Garner presents for   Foreign body in ear Left ear, lost tip of hearing aid about 2 weeks ago. Wax guard went missing. Temporary discomfort 2 weeks ago, resolved then sore again few days ago. No bleeding or discharge. Used otc [employer] swimmers ear drops (Belladonna, other ingredients) 3 times 2 days ago, once yesterday, none today. No further throbbing.  Wearing hearing aid in ear with new wax guard - no change in hearing.  No fever.  History Patient Active Problem List   Diagnosis Date Noted   Prostate cancer 01/10/2020   Fatigue 05/24/2018   Somnolence, daytime 05/24/2018   Obstructive sleep apnea treated with continuous positive airway pressure (CPAP) 01/31/2018   Snoring 09/28/2017   UARS (upper airway resistance syndrome) 09/28/2017   Retrognathia 09/28/2017   Allergic rhinitis due to allergen 09/28/2017   Obesity (BMI 30.0-34.9) 09/28/2017   Chronic pain of left knee 02/09/2017   Other seasonal allergic rhinitis 05/24/2014   Dyslipidemia    HYPOGODADISM MALE    Past Medical History:  Diagnosis Date   Allergy    Zyrtec daily.  Flonase PRN.   BPH (benign prostatic hyperplasia)    Dyslipidemia    Erectile dysfunction    GERD (gastroesophageal reflux disease)    Hyperlipidemia    Hypogonadism male    Obstructive sleep apnea    OSA on CPAP    Sleep apnea    Past Surgical History:  Procedure Laterality Date   CHOLECYSTECTOMY     COLONOSCOPY     deviated septum-cyst     EYE SURGERY     L eye tumor benign.   GALLBLADDER SURGERY     INGUINAL HERNIA REPAIR N/A 01/10/2020   Procedure: HERNIA REPAIR UMBILICAL, ADULT;  Surgeon: Heloise Purpura, MD;   Location: [facility];  Service: Urology;  Laterality: N/A;   LYMPHADENECTOMY Bilateral 01/10/2020   Procedure: LYMPHADENECTOMY, PELVIC;  Surgeon: Heloise Purpura, MD;  Location: [facility];  Service: Urology;  Laterality: Bilateral;   PILONIDAL CYST EXCISION     PROSTATE SURGERY N/A    [employer] 08/26/2020   ROBOT ASSISTED LAPAROSCOPIC RADICAL PROSTATECTOMY N/A 01/10/2020   Procedure: XI ROBOTIC ASSISTED LAPAROSCOPIC RADICAL PROSTATECTOMY LEVEL 2;  Surgeon: Heloise Purpura, MD;  Location: [facility];  Service: Urology;  Laterality: N/A;   VASECTOMY     Allergies  Allergen Reactions   Penicillins Rash    Teen Years   Prior to Admission medications   Medication Sig Start Date End Date Taking? Authorizing Provider  aspirin EC 81 MG tablet Take 81 mg by mouth daily. Swallow whole.   Yes [provider]  cetirizine (ZYRTEC) 10 MG tablet Take 1 tablet (10 mg total) by mouth daily. Patient taking differently: Take 10 mg by mouth at bedtime. 12/10/16  Yes McVey, Madelaine Bhat, PA-C  fluticasone (FLONASE) 50 MCG/ACT nasal spray Place 2 sprays into both nostrils daily. 12/10/16  Yes McVey, Madelaine Bhat, PA-C  ipratropium (ATROVENT) 0.06 % nasal spray Place 1-2 sprays into both nostrils 4 (four) times daily. As needed for nasal congestion 08/17/21  Yes Neva Seat,  Asencion Partridge, MD  montelukast (SINGULAIR) 10 MG tablet Take 1 tablet (10 mg total) by mouth daily. 10/07/22  Yes Shade Flood, MD  Multiple Vitamin (MULTI-VITAMIN) tablet Take 1 tablet by mouth daily. 01/13/16  Yes [provider]  rosuvastatin (CRESTOR) 10 MG tablet Take 1 tablet (10 mg total) by mouth daily. 10/18/22 10/13/23 Yes Jodelle Red, MD  acetaminophen (TYLENOL) 500 MG tablet Take 500 mg by mouth every 6 (six) hours as needed for mild pain or headache.  Patient not taking: Reported on 10/07/2022    [provider]   Social History   Socioeconomic History   Marital status: Married    Spouse name: Not on file    Number of children: Not on file   Years of education: Not on file   Highest education level: Master's degree (e.g., MA, MS, MEng, MEd, MSW, MBA)  Occupational History   Occupation: Public house manager: [employer]  Tobacco Use   Smoking status: Former    Types: Cigarettes    Quit date: 2002    Years since quitting: 22.3   Smokeless tobacco: Never  Vaping Use   Vaping Use: Never used  Substance and Sexual Activity   Alcohol use: Yes    Alcohol/week: 14.0 standard drinks of alcohol    Types: 14 Cans of beer per week    Comment: 1-2 beers daily now hes retired   Drug use: No   Sexual activity: Yes    Comment: vasectomy  Other Topics Concern   Not on file  Social History Narrative   Marital status: married x 26 years.      Children: 3 children (23, 21, 16); no grandchildren.      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      Tobacco: quit smoking 15 years ago.      Alcohol: rarely      Exercise:  3 times per week (weights, cardio).      Seatbelt: 100%      Guns: none   Social Determinants of Health   Financial Resource Strain: Low Risk  (11/17/2022)   Overall Financial Resource Strain (CARDIA)    Difficulty of Paying Living Expenses: Not hard at all  Food Insecurity: No Food Insecurity (11/17/2022)   Hunger Vital Sign    Worried About Running Out of Food in the Last Year: Never true    Ran Out of Food in the Last Year: Never true  Transportation Needs: No Transportation Needs (11/17/2022)   PRAPARE - Administrator, Civil Service (Medical): No    Lack of Transportation (Non-Medical): No  Physical Activity: Insufficiently Active (11/17/2022)   Exercise Vital Sign    Days of Exercise per Week: 4 days    Minutes of Exercise per Session: 30 min  Stress: No Stress Concern Present (11/17/2022)   [employer] of Occupational Health - Occupational Stress Questionnaire    Feeling of Stress :  Not at all  Social Connections: Socially Integrated (11/17/2022)   Social Connection and Isolation Panel [NHANES]    Frequency of Communication with Friends and Family: Twice a week    Frequency of Social Gatherings with Friends and Family: Twice a week    Attends Religious Services: More than 4 times per year    Active Member of [employer] or Organizations: Yes    Attends Banker Meetings: More than 4  times per year    Marital Status: Married  Catering manager Violence: Not At Risk (10/27/2022)   Humiliation, Afraid, Rape, and Kick questionnaire    Fear of Current or Ex-Partner: No    Emotionally Abused: No    Physically Abused: No    Sexually Abused: No    Review of Systems   Objective:   Vitals:   11/17/22 1401  BP: 136/74  Pulse: 84  Temp: 98.6 F (37 C)  TempSrc: Temporal  SpO2: 97%  Weight: 257 lb 6.4 oz (116.8 kg)  Height: 5' 11.5" (1.816 m)     Physical Exam Constitutional:      General: He is not in acute distress.    Appearance: Normal appearance. He is well-developed.  HENT:     Head: Normocephalic and atraumatic.     Right Ear: Tympanic membrane, ear canal and external ear normal.     Left Ear: External ear normal.     Ears:     Comments: Left canal, black foreign body in center of canal, but deep and surrounded by white to yellow appearing exudate, unable to visualize TM.  There is of slight swelling of canal.  No bleeding appreciated. Cardiovascular:     Rate and Rhythm: Normal rate.  Pulmonary:     Effort: Pulmonary effort is normal.  Neurological:     Mental Status: He is alert and oriented to person, place, and time.  Psychiatric:        Mood and Affect: Mood normal.        Assessment & Plan:  Miron Marxen is a 67 y.o. male . Left ear pain - Plan: Ambulatory referral to ENT, ofloxacin (FLOXIN) 0.3 % OTIC solution  Foreign body of left ear, initial encounter - Plan: Ambulatory referral to ENT, ofloxacin (FLOXIN) 0.3 % OTIC  solution  Acute otitis externa of left ear, unspecified type - Plan: Ambulatory referral to ENT, ofloxacin (FLOXIN) 0.3 % OTIC solution  Foreign body of left ear canal, wax guard from his hearing aid.  Unfortunately based on location, surrounding canal edema, exudate I am concerned with trying to remove that in office with alligator forceps for fear of possibly pushing it deeper, with unknown proximity to tympanic membrane.  Technically may be difficult to grab with alligator forceps and concomitant adequate visualization.  Surrounding exudate and discomfort likely otitis externa.   -Urgent referral to ENT, start Floxin otic drops for now with RTC/ER  precautions given.  Meds ordered this encounter  Medications   ofloxacin (FLOXIN) 0.3 % OTIC solution    Sig: Place 10 drops into the left ear daily.    Dispense:  5 mL    Refill:  0   Patient Instructions  Start eardrops for possible early swimmer's ear/external ear infection.  Fortunately based on the location of the wax guard, I am concerned that may not be able to safely remove that from our office.  I will refer you to ear nose and throat hopefully in the next few days to have that evaluated and removed.  Return to the clinic or go to the nearest emergency room if any of your symptoms worsen or new symptoms occur.     Signed,   Meredith Staggers, MD [facility], [facility] 11/17/22 2:50 PM

## 2022-11-23 ENCOUNTER — Ambulatory Visit (INDEPENDENT_AMBULATORY_CARE_PROVIDER_SITE_OTHER): Payer: Medicare Other | Admitting: Neurology

## 2022-11-23 ENCOUNTER — Encounter: Payer: Self-pay | Admitting: Neurology

## 2022-11-23 VITALS — BP 142/69 | HR 58 | Ht 72.0 in | Wt 256.0 lb

## 2022-11-23 DIAGNOSIS — G4733 Obstructive sleep apnea (adult) (pediatric): Secondary | ICD-10-CM | POA: Diagnosis not present

## 2022-11-23 DIAGNOSIS — E6609 Other obesity due to excess calories: Secondary | ICD-10-CM

## 2022-11-23 DIAGNOSIS — R400 Somnolence: Secondary | ICD-10-CM | POA: Diagnosis not present

## 2022-11-23 DIAGNOSIS — G478 Other sleep disorders: Secondary | ICD-10-CM

## 2022-11-23 NOTE — Progress Notes (Signed)
SLEEP MEDICINE CLINIC     Provider:  Melvyn Novas, MD  Primary Care Physician:  George Flood, MD [address] [place] [address]     Referring Provider: Shade Flood, Md [address] [place],  [place] [address]          Chief Complaint according to patient   Patient presents with:     New Patient (Initial Visit)           HISTORY OF PRESENT ILLNESS:  George Garner is a 67 y.o. male patient who is here for revisit 11/23/2022 for  a new CPAP machine , his current machine is set at 7 cm water and was issued in April 2019. Chief concern according to patient :  Patient feels well, is doing well on CPAP, he is very hard of hearing and recently lost a hearing aid tip in his left ear ( the wax guard). His machine was issued in 2019 and he will need a replacement.   George Garner's sleep study had taken place on 27 October 2017 was over 5 years ago.  At the time he had been using a CPAP machine that was about a decade old.  He had lost some weight in the interval he was excessively daytime sleepy and endorsed 17 points on the Epworth Sleepiness Scale at the time.  BMI was 34 and still is in the same range.  Baseline AHI 25.6/h.  Supine sleep AHI 80/h.  26 minutes of oxygen desaturation with a nadir at 78%.   He was titrated to 7 cm water CPAP with an E son nasal mask and did well.  His current compliance is 100% for days and hours with an average of 6 hours 6 minutes and a set pressure of 7 cm water.  His residual AHI now is 3.7 and most of these are hypopneas spells.  There is a moderate high air leak at the 95th percentile of 20.3 L a minute.  However it seems not to influence the overall apnea count very much.  So I will order today a home sleep test just to have a baseline for 1 night and then we will order an AutoSet likely auto titration device for this patient.     George Garner is a 67 y.o. male , seen here as in a referral from George Garner for a CPAP follow  up.  He was not seen here for over 3 years, is de-facto anew patient.has used CPAP compliantly, wife is happy and he feels rested in AM. Never had trouble to initiate sleep but still has difficulties o stay asleep. He retired January 1st 2023.  The patient also reports that he averages about 6 hours of sleep but when he wakes up very early during his night he is often not able to return to sleep, he believes the bed and mother do something productive.  She obviously looks at the clock.  He does report that he has a tendency to fall asleep when physically inactive and not mentally stimulated.  That may be sitting and reading or watching TV or even in a church or waiting room.   His sleep study was a performed here as a split-night titration on 3-28 2019 that the patient was at that time as well referred by George Garner.  He already on a CPAP machine that was about a decade old.  He had lost 35 pounds  when he felt that he did no longer needed.  He would like to resume CPAP due to excessive daytime sleepiness and snoring again after he regained some weight.  At the time he endorsed the Epworth Sleepiness Scale at 17 points FSS at 26 point GDS at 2 out of 15 points.  BMI was 34.  His baseline study confirmed an AHI of 25.6/h remarkably not REM sleep dependent apnea in rem sleep there were only 2.3 AHI per hour he avoided supine sleep and only slept about 32 minutes in that position but supine sleep was associated with an AHI of 80/h.  He had 26 minutes of oxygen desaturation with a nadir at 78%.  The study was split and CPAP initiated at 5 and titrated to 7 cm water on an E son nasal mask and medium size.  Sleep efficiency became much higher on the CPAP AHI was 0.3.  And he had a follow-up visit with nurse practitioner Latrelle Dodrill in October 2019.  He has been using the same machine since and average user time is 6 hours 1 minutes and usually his compliance is between 93 and 97%.  So he is a highly  compliant CPAP user there is even a print out provided for whole year and very few days that you use the machine for less than 4 hours.  The average AHI residual is 5.5 events per hour the 95th percentile air leak is 22.6 L/min and over the last 3 weeks he had higher residual AHI than he had before which may be partially due to supply.    Review of Systems: Out of a complete 14 system review, the patient complains of only the following symptoms, and all other reviewed systems are negative.:  fragmented sleep.    How likely are you to doze in the following situations: 0 = not likely, 1 = slight chance, 2 = moderate chance, 3 = high chance   Sitting and Reading? Watching Television? Sitting inactive in a public place (theater or meeting)? As a passenger in a car for an hour without a break? Lying down in the afternoon when circumstances permit? Sitting and talking to someone? Sitting quietly after lunch without alcohol? In a car, while stopped for a few minutes in traffic?   Total = 12/ 24 points  on CPAP .   FSS endorsed at 15/ 63 points.   Social History   Socioeconomic History   Marital status: Married    Spouse name: Not on file   Number of children: Not on file   Years of education: Not on file   Highest education level: Master's degree (e.g., MA, MS, MEng, MEd, MSW, MBA)  Occupational History   Occupation: Public house manager: [employer]  Tobacco Use   Smoking status: Former    Types: Cigarettes    Quit date: 2002    Years since quitting: 22.3   Smokeless tobacco: Never  Vaping Use   Vaping Use: Never used  Substance and Sexual Activity   Alcohol use: Yes    Alcohol/week: 14.0 standard drinks of alcohol    Types: 14 Cans of beer per week    Comment: 1-2 beers daily now hes retired   Drug use: No   Sexual activity: Yes    Comment: vasectomy  Other Topics Concern   Not on file  Social History Narrative   Marital status: married x 26 years.       Children: 3 children (23, 21,  16); no grandchildren.      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      Tobacco: quit smoking 15 years ago.      Alcohol: rarely      Exercise:  3 times per week (weights, cardio).      Seatbelt: 100%      Guns: none   Social Determinants of Health   Financial Resource Strain: Low Risk  (11/17/2022)   Overall Financial Resource Strain (CARDIA)    Difficulty of Paying Living Expenses: Not hard at all  Food Insecurity: No Food Insecurity (11/17/2022)   Hunger Vital Sign    Worried About Running Out of Food in the Last Year: Never true    Ran Out of Food in the Last Year: Never true  Transportation Needs: No Transportation Needs (11/17/2022)   PRAPARE - Administrator, Civil Service (Medical): No    Lack of Transportation (Non-Medical): No  Physical Activity: Insufficiently Active (11/17/2022)   Exercise Vital Sign    Days of Exercise per Week: 4 days    Minutes of Exercise per Session: 30 min  Stress: No Stress Concern Present (11/17/2022)   [employer] of Occupational Health - Occupational Stress Questionnaire    Feeling of Stress : Not at all  Social Connections: Socially Integrated (11/17/2022)   Social Connection and Isolation Panel [NHANES]    Frequency of Communication with Friends and Family: Twice a week    Frequency of Social Gatherings with Friends and Family: Twice a week    Attends Religious Services: More than 4 times per year    Active Member of [employer] or Organizations: Yes    Attends Engineer, structural: More than 4 times per year    Marital Status: Married    Family History  Problem Relation Age of Onset   Diabetes Mother    Dementia Mother    Arthritis Brother    Colon cancer Neg Hx    Colon polyps Neg Hx    Esophageal cancer Neg Hx    Stomach cancer Neg Hx    Rectal cancer Neg Hx     Past Medical History:  Diagnosis Date   Allergy    Zyrtec  daily.  Flonase PRN.   BPH (benign prostatic hyperplasia)    Dyslipidemia    Erectile dysfunction    GERD (gastroesophageal reflux disease)    Hyperlipidemia    Hypogonadism male    Obstructive sleep apnea    OSA on CPAP    Sleep apnea     Past Surgical History:  Procedure Laterality Date   CHOLECYSTECTOMY     COLONOSCOPY     deviated septum-cyst     EYE SURGERY     L eye tumor benign.   GALLBLADDER SURGERY     INGUINAL HERNIA REPAIR N/A 01/10/2020   Procedure: HERNIA REPAIR UMBILICAL, ADULT;  Surgeon: Heloise Purpura, MD;  Location: [facility];  Service: Urology;  Laterality: N/A;   LYMPHADENECTOMY Bilateral 01/10/2020   Procedure: LYMPHADENECTOMY, PELVIC;  Surgeon: Heloise Purpura, MD;  Location: [facility];  Service: Urology;  Laterality: Bilateral;   PILONIDAL CYST EXCISION     PROSTATE SURGERY N/A    [employer] 08/26/2020   ROBOT ASSISTED LAPAROSCOPIC RADICAL PROSTATECTOMY N/A 01/10/2020   Procedure: XI ROBOTIC ASSISTED LAPAROSCOPIC RADICAL PROSTATECTOMY LEVEL 2;  Surgeon: Heloise Purpura, MD;  Location: [facility];  Service: Urology;  Laterality: N/A;  VASECTOMY       Current Outpatient Medications on File Prior to Visit  Medication Sig Dispense Refill   acetaminophen (TYLENOL) 500 MG tablet Take 500 mg by mouth every 6 (six) hours as needed for mild pain or headache.     aspirin EC 81 MG tablet Take 81 mg by mouth daily. Swallow whole.     cetirizine (ZYRTEC) 10 MG tablet Take 1 tablet (10 mg total) by mouth daily. (Patient taking differently: Take 10 mg by mouth at bedtime.) 30 tablet 1   fluticasone (FLONASE) 50 MCG/ACT nasal spray Place 2 sprays into both nostrils daily. 16 g 1   ipratropium (ATROVENT) 0.06 % nasal spray Place 1-2 sprays into both nostrils 4 (four) times daily. As needed for nasal congestion 15 mL 5   montelukast (SINGULAIR) 10 MG tablet Take 1 tablet (10 mg total) by mouth daily. 90 tablet 3   Multiple Vitamin (MULTI-VITAMIN) tablet Take 1 tablet by mouth daily.      ofloxacin (FLOXIN) 0.3 % OTIC solution Place 10 drops into the left ear daily. 5 mL 0   rosuvastatin (CRESTOR) 10 MG tablet Take 1 tablet (10 mg total) by mouth daily. 90 tablet 3   No current facility-administered medications on file prior to visit.    Allergies  Allergen Reactions   Penicillins Rash    Teen Years     DIAGNOSTIC DATA (LABS, IMAGING, TESTING) - I reviewed patient records, labs, notes, testing and imaging myself where available.  Lab Results  Component Value Date   WBC 5.2 08/29/2020   HGB 14.8 08/29/2020   HCT 44.7 08/29/2020   MCV 91 08/29/2020   PLT 182 08/29/2020      Component Value Date/Time   NA 141 10/07/2022 1005   NA 141 08/29/2020 1159   K 4.6 10/07/2022 1005   CL 105 10/07/2022 1005   CO2 30 10/07/2022 1005   GLUCOSE 80 10/07/2022 1005   BUN 16 10/07/2022 1005   BUN 26 08/29/2020 1159   CREATININE 1.07 10/07/2022 1005   CREATININE 1.13 07/02/2016 1650   CALCIUM 9.7 10/07/2022 1005   PROT 7.1 10/07/2022 1005   PROT 6.8 03/01/2022 1022   ALBUMIN 4.1 10/07/2022 1005   ALBUMIN 4.4 03/01/2022 1022   AST 33 10/07/2022 1005   ALT 41 10/07/2022 1005   ALKPHOS 53 10/07/2022 1005   BILITOT 0.7 10/07/2022 1005   BILITOT 0.5 03/01/2022 1022   GFRNONAA 66 08/29/2020 1159   GFRNONAA 70 07/02/2016 1650   GFRAA 76 08/29/2020 1159   GFRAA 81 07/02/2016 1650   Lab Results  Component Value Date   CHOL 137 10/07/2022   HDL 38.20 (L) 10/07/2022   LDLCALC 76 10/07/2022   TRIG 115.0 10/07/2022   CHOLHDL 4 10/07/2022   Lab Results  Component Value Date   HGBA1C 5.7 10/07/2022   No results found for: "VITAMINB12" Lab Results  Component Value Date   TSH 2.540 01/08/2019    PHYSICAL EXAM:  Today's Vitals   11/23/22 1030 11/23/22 1037  BP: (!) 148/67 (!) 142/69  Pulse: (!) 57 (!) 58  Weight: 256 lb (116.1 kg)   Height: 6' (1.829 m)    Body mass index is 34.72 kg/m.   Wt Readings from Last 3 Encounters:  11/23/22 256 lb (116.1 kg)   11/17/22 257 lb 6.4 oz (116.8 kg)  10/07/22 254 lb 6.4 oz (115.4 kg)     Ht Readings from Last 3 Encounters:  11/23/22 6' (1.829 m)  11/17/22  5' 11.5" (1.816 m)  10/07/22 5' 11.5" (1.816 m)      General: The patient is awake, alert and appears not in acute distress.  Facial hair.  Head: Normocephalic, atraumatic. Neck is supple.  Mallampati: 1-2  neck circumference:19.5 inches .  Nasal airflow patent , Prognathia is seen.  Cardiovascular:  Regular rate and rhythm , without  murmurs or carotid bruit, and without distended neck veins. Respiratory: Lungs are clear to auscultation. Skin:  Without evidence of edema, or rash Trunk: BMI is 34.18.  The patient's posture is erect    Neurologic exam : The patient is awake and alert, oriented to place and time.   Attention span & concentration ability appears normal.  Speech is fluent, without dysarthria, dysphonia or aphasia.  Mood and affect are appropriate. Cranial nerves: Pupils are equal and briskly reactive to light.  Funduscopic exam without evidence of pallor or edema.  Extraocular movements in vertical and horizontal planes intact and without nystagmus.  Visual fields by finger perimetry are intact. Hearing to finger rub intact.   Facial sensation intact to fine touch. Facial motor strength is symmetric and tongue and uvula move midline. Shoulder shrug was symmetrical.  Motor exam:  Normal tone, muscle bulk and symmetric strength in all extremities. Sensory:  Fine touch, pinprick and vibration were tested in all extremities. Proprioception tested in the upper extremities was normal. Coordination: Rapid alternating movements and finger-to-nose maneuver without evidence of ataxia, dysmetria or tremor. Gait and station: Patient walks without assistive device. Deep tendon reflexes: in the  upper and lower extremities are symmetric and intact.      ASSESSMENT AND PLAN 67 y.o. year old male  here with:    1) OSA on CPAP,  highly compliant. His baseline OSA study showed Hypoxia and supine dominant apnea.  100% and with moderate air leakage as expected in a patient with facial hair.   Repeat HST for new baseline by watch pat, follow up with autotitration device order.   2) risk factors for OSA are ongoing. BMI 34.96from snoring that his wife has observed at night when not using CPAP .   He is snoring, he does have crescendo snoring which is strongly associated with OSA. He has retrognathia.    3)He does have at times elevated blood pressure,    He has been doing well with Flonase keeping the nasal patency up and he is taking a multivitamin as well as a baby aspirin daily.    4) still reports shorter than desired sleep time, average of 6 hours at night, and hypersomnia in daytime.  Power naps don't work for him, he can't go to sleep- he falls asleep when he is not trying to go to sleep  HST ordered. I will provide a script for new machine and supplies for him  after HST results are in-DME  [facility]-    I plan to follow up either personally or through our NP within 3-5 months.   I would like to thank George Flood, MD for allowing me to meet with and to take care of this pleasant patient.   CC: I will share my notes with PCP.  After spending a total time of  25  minutes face to face and additional time for physical and neurologic examination, review of laboratory studies,  personal review of imaging studies, reports and results of other testing and review of referral information / records as far as provided in visit,   Electronically signed by: Porfirio Mylar  Roye Gustafson, MD 11/23/2022 10:48 AM  [facility] and [employer] certified by [employer] of Sleep Medicine and Diplomate of the [employer] of Sleep Medicine. Board certified In Neurology through the ABPN, Fellow of the [employer] of Neurology. Medical Director of [facility].

## 2022-12-07 ENCOUNTER — Encounter: Payer: Self-pay | Admitting: Neurology

## 2022-12-08 NOTE — Progress Notes (Signed)
High compliance with PAP and excellent apnea resolution. Continue to use PAP without changes to settings.

## 2022-12-22 ENCOUNTER — Ambulatory Visit (INDEPENDENT_AMBULATORY_CARE_PROVIDER_SITE_OTHER): Payer: Medicare Other | Admitting: Neurology

## 2022-12-22 DIAGNOSIS — Z6834 Body mass index (BMI) 34.0-34.9, adult: Secondary | ICD-10-CM

## 2022-12-22 DIAGNOSIS — G4733 Obstructive sleep apnea (adult) (pediatric): Secondary | ICD-10-CM | POA: Diagnosis not present

## 2022-12-22 DIAGNOSIS — R400 Somnolence: Secondary | ICD-10-CM

## 2022-12-22 DIAGNOSIS — E6609 Other obesity due to excess calories: Secondary | ICD-10-CM

## 2022-12-22 DIAGNOSIS — R5382 Chronic fatigue, unspecified: Secondary | ICD-10-CM

## 2022-12-23 NOTE — Progress Notes (Signed)
[facility] "George Garner" 67 year old male 1955/12/20   HOME SLEEP TEST REPORT ( by Watch PAT)   STUDY DATE:  12-23-2022   ORDERING CLINICIAN: Melvyn Novas, MD  REFERRING CLINICIAN: Dr Neva Seat, MD   CLINICAL INFORMATION/HISTORY: George Garner is a 67 y.o. male patient who is a current CPAP user and will need a new CPAP machine soon.  here for revisit 11/23/2022. His current machine is set at 7 cm water and was issued in April 2019.   George Garner's sleep study had taken place on 27 October 2017 which was over 5 years ago.  At the time he had been using a CPAP machine that was about a decade old.   He had lost some weight in the interval he was excessively daytime sleepy and endorsed 17 points on the Epworth Sleepiness Scale at the time.  BMI was 34 and still is in the same range.  Baseline AHI 25.6/h.  Supine sleep AHI 80/h.  26 minutes of oxygen desaturation with a nadir at 78%.   He was titrated to 7 cm water CPAP with an E son nasal mask and did well.   His current compliance is 100% for days and hours with an average of 6 hours 6 minutes and a set pressure of 7 cm water.  His residual AHI now is 3.7 and most of these are hypopneas spells.  There is a moderate high air leak at the 95th percentile of 20.3 L a minute.  However it seems not to influence the overall apnea count very much.  So I will order today a home sleep test just to have a baseline for 1 night and then we will order an AutoSet likely auto titration device for this patient.     Epworth sleepiness score: on CPAP 12 /24. FSS at 15/ 63 points.   No GDS    BMI: 34 kg/m   Neck Circumference: 19.5   FINDINGS:   Sleep Summary:   Total Recording Time (hours, min):     6 hours 58 minutes   Total Sleep Time (hours, min):    Total sleep time 5 hours 34 minutes             Percent REM (%): 22.1%                                      Respiratory Indices by AASM criteria:   Calculated pAHI (per  hour): 27.1/h     .  Following CMS or Medicare criteria the AHI is 16.2/h                      REM pAHI: 22.7/h, and by CMS criteria 16.2/h.                                                NREM pAHI: 28.3/h, and by CMS criteria 16.1/h.                             Positional AHI : The patient slept nearly all night in supine position associated with an AASM related AHI of 29.3/h and a CMS related AHI of  16.4/h.   Oxygen Saturation Statistics:    O2 Saturation Range (%):   Between a nadir at 83% of the maximal saturation of 98% with a mean saturation of 94%                                    O2 Saturation (minutes) <89%: 3.5 minutes         Pulse Rate Statistics:   Pulse Mean (bpm):   61 bpm              Pulse Range:    Between 53 and 84 bpm             IMPRESSION:  This HST confirms the presence of moderately severe sleep apnea following AASM criteria and moderate sleep apnea when following CMS criteria.  In any case, there is enough sleep apnea present to justify renewing his CPAP machine.   RECOMMENDATION: Auto titration capable ResMed CPAP machine with a setting between 4 and 15 cmH2O pressure 3 cm EPR, humidifier and interface of patient's choice and comfort.  The patient will follow-up with one of our nurse practitioners in the next visit which will be between day 30 and day 80 of CPAP use.    INTERPRETING PHYSICIAN:   Melvyn Novas, MD / Medical Director

## 2023-01-12 NOTE — Procedures (Signed)
[facility] "[name]" 67 year old male Jun 26, 1956   HOME SLEEP TEST REPORT ( by Watch PAT)   STUDY DATE:  12-23-2022   ORDERING CLINICIAN: Melvyn Novas, MD  REFERRING CLINICIAN: Dr Neva Seat, MD   CLINICAL INFORMATION/HISTORY: George Garner is a 67 y.o. male patient who is a current CPAP user and will need a new CPAP machine soon.  here for revisit 11/23/2022. His current machine is set at 7 cm water and was issued in April 2019.   George Garner's sleep study had taken place on 27 October 2017 which was over 5 years ago.  At the time he had been using a CPAP machine that was about a decade old.   He had lost some weight in the interval he was excessively daytime sleepy and endorsed 17 points on the Epworth Sleepiness Scale at the time.  BMI was 34 and still is in the same range.  Baseline AHI 25.6/h.  Supine sleep AHI 80/h.  26 minutes of oxygen desaturation with a nadir at 78%.   He was titrated to 7 cm water CPAP with an E son nasal mask and did well.   His current compliance is 100% for days and hours with an average of 6 hours 6 minutes and a set pressure of 7 cm water.  His residual AHI now is 3.7 and most of these are hypopneas spells.  There is a moderate high air leak at the 95th percentile of 20.3 L a minute.  However it seems not to influence the overall apnea count very much.  So I will order today a home sleep test just to have a baseline for 1 night and then we will order an AutoSet likely auto titration device for this patient.     Epworth sleepiness score: on CPAP 12 /24. FSS at 15/ 63 points.   No GDS    BMI: 34 kg/m   Neck Circumference: 19.5   FINDINGS:   Sleep Summary:   Total Recording Time (hours, min):     6 hours 58 minutes   Total Sleep Time (hours, min):    Total sleep time 5 hours 34 minutes             Percent REM (%): 22.1%                                      Respiratory Indices by AASM criteria:   Calculated pAHI (per hour): 27.1/h      .  Following CMS or Medicare criteria the AHI is 16.2/h                      REM pAHI: 22.7/h, and by CMS criteria 16.2/h.                                                NREM pAHI: 28.3/h, and by CMS criteria 16.1/h.                             Positional AHI : The patient slept nearly all night in supine position associated with an AASM related AHI of 29.3/h and a CMS related AHI of 16.4/h.   Oxygen Saturation Statistics:  O2 Saturation Range (%):   Between a nadir at 83% of the maximal saturation of 98% with a mean saturation of 94%                                    O2 Saturation (minutes) <89%: 3.5 minutes         Pulse Rate Statistics:   Pulse Mean (bpm):   61 bpm              Pulse Range:    Between 53 and 84 bpm             IMPRESSION:  This HST confirms the presence of moderately severe sleep apnea following AASM criteria and moderate sleep apnea when following CMS criteria.  In any case, there is enough sleep apnea present to justify renewing his CPAP machine.   RECOMMENDATION: Auto titration capable ResMed CPAP machine with a setting between 4 and 15 cmH2O pressure 3 cm EPR, humidifier and interface of patient's choice and comfort.  The patient will follow-up with one of our nurse practitioners in the next visit which will be between day 30 and day 80 of CPAP use.    INTERPRETING PHYSICIAN:   Melvyn Novas, MD / Medical Director

## 2023-02-08 ENCOUNTER — Other Ambulatory Visit: Payer: Self-pay | Admitting: Neurology

## 2023-02-08 DIAGNOSIS — G4733 Obstructive sleep apnea (adult) (pediatric): Secondary | ICD-10-CM

## 2023-03-17 ENCOUNTER — Encounter (INDEPENDENT_AMBULATORY_CARE_PROVIDER_SITE_OTHER): Payer: Self-pay

## 2023-04-12 ENCOUNTER — Encounter: Payer: Self-pay | Admitting: Family Medicine

## 2023-04-13 ENCOUNTER — Ambulatory Visit (INDEPENDENT_AMBULATORY_CARE_PROVIDER_SITE_OTHER): Payer: Medicare Other | Admitting: Family Medicine

## 2023-04-13 ENCOUNTER — Encounter: Payer: Self-pay | Admitting: Family Medicine

## 2023-04-13 VITALS — BP 108/60 | HR 57 | Temp 97.7°F | Wt 238.0 lb

## 2023-04-13 DIAGNOSIS — Z6832 Body mass index (BMI) 32.0-32.9, adult: Secondary | ICD-10-CM

## 2023-04-13 DIAGNOSIS — E785 Hyperlipidemia, unspecified: Secondary | ICD-10-CM | POA: Diagnosis not present

## 2023-04-13 DIAGNOSIS — G4733 Obstructive sleep apnea (adult) (pediatric): Secondary | ICD-10-CM

## 2023-04-13 DIAGNOSIS — R7309 Other abnormal glucose: Secondary | ICD-10-CM | POA: Diagnosis not present

## 2023-04-13 DIAGNOSIS — Z23 Encounter for immunization: Secondary | ICD-10-CM

## 2023-04-13 DIAGNOSIS — M7712 Lateral epicondylitis, left elbow: Secondary | ICD-10-CM

## 2023-04-13 DIAGNOSIS — E669 Obesity, unspecified: Secondary | ICD-10-CM

## 2023-04-13 LAB — COMPREHENSIVE METABOLIC PANEL
ALT: 28 U/L (ref 0–53)
AST: 23 U/L (ref 0–37)
Albumin: 4.2 g/dL (ref 3.5–5.2)
Alkaline Phosphatase: 55 U/L (ref 39–117)
BUN: 19 mg/dL (ref 6–23)
CO2: 30 meq/L (ref 19–32)
Calcium: 9.8 mg/dL (ref 8.4–10.5)
Chloride: 103 meq/L (ref 96–112)
Creatinine, Ser: 1.09 mg/dL (ref 0.40–1.50)
GFR: 69.93 mL/min (ref >60.00)
Glucose, Bld: 96 mg/dL (ref 70–99)
Potassium: 4.2 meq/L (ref 3.5–5.1)
Sodium: 138 meq/L (ref 135–145)
Total Bilirubin: 0.7 mg/dL (ref 0.2–1.2)
Total Protein: 7.1 g/dL (ref 6.0–8.3)

## 2023-04-13 LAB — LIPID PANEL
Cholesterol: 124 mg/dL (ref 0–200)
HDL: 43.8 mg/dL (ref >39.00)
LDL Cholesterol: 62 mg/dL (ref 0–99)
NonHDL: 79.99
Total CHOL/HDL Ratio: 3
Triglycerides: 90 mg/dL (ref 0.0–149.0)
VLDL: 18 mg/dL (ref 0.0–40.0)

## 2023-04-13 LAB — HEMOGLOBIN A1C: Hgb A1c MFr Bld: 5.7 % (ref 4.6–6.5)

## 2023-04-13 NOTE — Progress Notes (Signed)
Subjective:  Patient ID: George Garner, male    DOB: [birth year]  Age: 67 y.o. MRN: [id number]  CC:  Chief Complaint  Patient presents with   Medical Management of Chronic Issues    HPI [name] presents for   Left elbow pain: Past month and a half. NKI, no new activities. R hand dominant. Similar issue in past - resolved with stretches, no medical eval. Plays guitar. No change in amount, but plays a lot.  Tx: stretches, icy hot - no relief,  Hyperlipidemia: Crestor 10mg  every day. Prior borderline LFT, normalized on most recent testing. No new side effects.  Fasting today.  Lab Results  Component Value Date   CHOL 137 10/07/2022   HDL 38.20 (L) 10/07/2022   LDLCALC 76 10/07/2022   TRIG 115.0 10/07/2022   CHOLHDL 4 10/07/2022   Lab Results  Component Value Date   ALT 41 10/07/2022   AST 33 10/07/2022   ALKPHOS 53 10/07/2022   BILITOT 0.7 10/07/2022   OSA on CPAP Upper airway resistance syndrome, retrognathia.  CPAP nightly.  Followed by sleep specialist. Sleeping better. Prior poor sleep 4-5 hours per night, now about 7 hrs per night with low dose THC gummy. 1/2 dose works well. Still using CPAP, waking up rested, recent new machine - adjusting to that machine.   Allergic rhinitis Zyrtec, Singulair daily. Flonase as needed during allergy season, Atrovent nasal spray as needed.  Obesity With borderline prediabetes, A1c 5.7 in March.  Weight has significantly improved since April visit. Some Keto diet, trying to avoid sugars/starches, 2 mile walks per day.   Wt Readings from Last 3 Encounters:  04/13/23 238 lb (108 kg)  11/23/22 256 lb (116.1 kg)  11/17/22 257 lb 6.4 oz (116.8 kg)   Body mass index is 32.28 kg/m. Lab Results  Component Value Date   HGBA1C 5.7 10/07/2022   Immunization History  Administered Date(s) Administered   Fluad Quad(high Dose 65+) 04/10/2021   Influenza Whole 04/29/2011   Influenza,inj,Quad PF,6+ Mos 05/13/2014, 06/23/2015,  07/02/2016, 08/12/2017, 05/26/2018, 04/04/2019   Influenza-Unspecified 05/01/2021   PFIZER Comirnaty(Gray Top)Covid-19 Tri-Sucrose Vaccine 09/21/2019, 10/15/2019, 05/09/2020, 02/13/2021   Pneumococcal Conjugate-13 08/29/2020   Pneumococcal Polysaccharide-23 10/01/2021   Tdap 06/08/2006, 07/02/2016   Zoster Recombinant(Shingrix) 02/13/2021, 06/05/2021   Zoster, Live 08/03/2011    History Patient Active Problem List   Diagnosis Date Noted   Class 1 obesity due to excess calories without serious comorbidity with body mass index (BMI) of 34.0 to 34.9 in adult 11/23/2022   Prostate cancer (HCC) 01/10/2020   Fatigue 05/24/2018   Somnolence, daytime 05/24/2018   Obstructive sleep apnea treated with continuous positive airway pressure (CPAP) 01/31/2018   Snoring 09/28/2017   UARS (upper airway resistance syndrome) 09/28/2017   Retrognathia 09/28/2017   Allergic rhinitis due to allergen 09/28/2017   Obesity (BMI 30.0-34.9) 09/28/2017   Chronic pain of left knee 02/09/2017   Other seasonal allergic rhinitis 05/24/2014   Dyslipidemia    HYPOGODADISM MALE    Past Medical History:  Diagnosis Date   Allergy    Zyrtec daily.  Flonase PRN.   BPH (benign prostatic hyperplasia)    Dyslipidemia    Erectile dysfunction    GERD (gastroesophageal reflux disease)    Hyperlipidemia    Hypogonadism male    Obstructive sleep apnea    OSA on CPAP    Sleep apnea    Past Surgical History:  Procedure Laterality Date   CHOLECYSTECTOMY     COLONOSCOPY  deviated septum-cyst     EYE SURGERY     L eye tumor benign.   GALLBLADDER SURGERY     INGUINAL HERNIA REPAIR N/A 01/10/2020   Procedure: HERNIA REPAIR UMBILICAL, ADULT;  Surgeon: Heloise Purpura, MD;  Location: [facility];  Service: Urology;  Laterality: N/A;   LYMPHADENECTOMY Bilateral 01/10/2020   Procedure: LYMPHADENECTOMY, PELVIC;  Surgeon: Heloise Purpura, MD;  Location: [facility];  Service: Urology;  Laterality: Bilateral;   PILONIDAL CYST  EXCISION     PROSTATE SURGERY N/A    [employer]rees[employer] 08/26/2020   ROBOT ASSISTED LAPAROSCOPIC RADICAL PROSTATECTOMY N/A 01/10/2020   Procedure: XI ROBOTIC ASSISTED LAPAROSCOPIC RADICAL PROSTATECTOMY LEVEL 2;  Surgeon: Heloise Purpura, MD;  Location: [facility];  Service: Urology;  Laterality: N/A;   VASECTOMY     Allergies  Allergen Reactions   Penicillins Rash    Teen Years   Prior to Admission medications   Medication Sig Start Date End Date Taking? Authorizing Provider  acetaminophen (TYLENOL) 500 MG tablet Take 500 mg by mouth every 6 (six) hours as needed for mild pain or headache.   Yes [provider]  aspirin EC 81 MG tablet Take 81 mg by mouth daily. Swallow whole.   Yes [provider]  cetirizine (ZYRTEC) 10 MG tablet Take 1 tablet (10 mg total) by mouth daily. Patient taking differently: Take 10 mg by mouth at bedtime. 12/10/16  Yes McVey, Madelaine Bhat, PA-C  fluticasone (FLONASE) 50 MCG/ACT nasal spray Place 2 sprays into both nostrils daily. 12/10/16  Yes McVey, Madelaine Bhat, PA-C  ipratropium (ATROVENT) 0.06 % nasal spray Place 1-2 sprays into both nostrils 4 (four) times daily. As needed for nasal congestion 08/17/21  Yes Shade Flood, MD  montelukast (SINGULAIR) 10 MG tablet Take 1 tablet (10 mg total) by mouth daily. 10/07/22  Yes Shade Flood, MD  Multiple Vitamin (MULTI-VITAMIN) tablet Take 1 tablet by mouth daily. 01/13/16  Yes [provider]  rosuvastatin (CRESTOR) 10 MG tablet Take 1 tablet (10 mg total) by mouth daily. 10/18/22 10/13/23 Yes Jodelle Red, MD  ofloxacin (FLOXIN) 0.3 % OTIC solution Place 10 drops into the left ear daily. 11/17/22   Shade Flood, MD   Social History   Socioeconomic History   Marital status: Married    Spouse name: Not on file   Number of children: Not on file   Years of education: Not on file   Highest education level: Master's degree (e.g., MA, MS, MEng, MEd, MSW, MBA)  Occupational  History   Occupation: Public house manager: [employer]  Tobacco Use   Smoking status: Former    Current packs/day: 0.00    Types: Cigarettes    Quit date: 2002    Years since quitting: 22.7   Smokeless tobacco: Never  Vaping Use   Vaping status: Never Used  Substance and Sexual Activity   Alcohol use: Yes    Alcohol/week: 14.0 standard drinks of alcohol    Types: 14 Cans of beer per week    Comment: 1-2 beers daily now hes retired   Drug use: No   Sexual activity: Yes    Comment: vasectomy  Other Topics Concern   Not on file  Social History Narrative   Marital status: married x 26 years.      Children: 3 children (23, 21, 16); no grandchildren.      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      Tobacco: quit smoking 15 years ago.      Alcohol: rarely      Exercise:  3 times per week (weights, cardio).      Seatbelt: 100%      Guns: none   Social Determinants of Health   Financial Resource Strain: Low Risk  (11/17/2022)   Overall Financial Resource Strain (CARDIA)    Difficulty of Paying Living Expenses: Not hard at all  Food Insecurity: No Food Insecurity (11/17/2022)   Hunger Vital Sign    Worried About Running Out of Food in the Last Year: Never true    Ran Out of Food in the Last Year: Never true  Transportation Needs: No Transportation Needs (11/17/2022)   PRAPARE - Administrator, Civil Service (Medical): No    Lack of Transportation (Non-Medical): No  Physical Activity: Insufficiently Active (11/17/2022)   Exercise Vital Sign    Days of Exercise per Week: 4 days    Minutes of Exercise per Session: 30 min  Stress: No Stress Concern Present (11/17/2022)   [employer] of Occupational Health - Occupational Stress Questionnaire    Feeling of Stress : Not at all  Social Connections: Socially Integrated (11/17/2022)   Social Connection and Isolation Panel [NHANES]    Frequency of  Communication with Friends and Family: Twice a week    Frequency of Social Gatherings with Friends and Family: Twice a week    Attends Religious Services: More than 4 times per year    Active Member of [employer] or Organizations: Yes    Attends Engineer, structural: More than 4 times per year    Marital Status: Married  Catering manager Violence: Not At Risk (10/27/2022)   Humiliation, Afraid, Rape, and Kick questionnaire    Fear of Current or Ex-Partner: No    Emotionally Abused: No    Physically Abused: No    Sexually Abused: No    Review of Systems Per HPI   Objective:   Vitals:   04/13/23 0808  BP: 108/60  Pulse: (!) 57  Temp: 97.7 F (36.5 C)  TempSrc: Oral  SpO2: 97%  Weight: 238 lb (108 kg)     Physical Exam Vitals reviewed.  Constitutional:      Appearance: He is well-developed.  HENT:     Head: Normocephalic and atraumatic.  Neck:     Vascular: No carotid bruit or JVD.  Cardiovascular:     Rate and Rhythm: Normal rate and regular rhythm.     Heart sounds: Normal heart sounds. No murmur heard. Pulmonary:     Effort: Pulmonary effort is normal.     Breath sounds: Normal breath sounds. No rales.  Musculoskeletal:     Right lower leg: No edema.     Left lower leg: No edema.     Comments: Left elbow, full range of motion, no soft tissue swelling or erythema noted.  Skin intact.  Tender to palpation over the lateral epicondyle, reproducing pain along with discomfort on resisted wrist extension.  Skin:    General: Skin is warm and dry.  Neurological:     Mental Status: He is alert and oriented to person, place, and time.  Psychiatric:        Mood and Affect: Mood normal.     Assessment & Plan:  Hinckley Morena is a 67 y.o. male . Dyslipidemia - Plan: Comprehensive metabolic panel, Lipid panel  - commended on weight  loss, anticipate improved readings, continue Crestor same dose for now.  Need for influenza vaccination - Plan: Flu Vaccine Trivalent  High Dose (Fluad)  Class 1 obesity with body mass index (BMI) of 32.0 to 32.9 in adult, unspecified obesity type, unspecified whether serious comorbidity present  -As above, commended weight loss.  OSA on CPAP  -Overall stable, improved sleep as above.  No changes for now, adjusting to new device.  Left lateral epicondylitis  -Counterforce brace, activity modification discussed, handout given, RTC precautions.  Elevated hemoglobin A1c - Plan: Hemoglobin A1c  -Borderline previously, repeat A1c.  No orders of the defined types were placed in this encounter.  Patient Instructions  Next recommended today.  No med changes for now.  I suspect your labs will look better with the weight loss, great work!  Elbow pain appears to be tennis elbow or lateral epicondylitis.  See information below.  Try to avoid downward grasp carrying as we discussed, and repetitive activity of the forearm, wrist.  Okay to use tennis elbow splint/counterforce brace over-the-counter if needed.  If that does not improve in the next few weeks, schedule a visit and we can discuss other treatments.  Tennis Elbow  Tennis elbow (lateral epicondylitis) is inflammation of tendons in your outer forearm, near your elbow. Tendons are tissues that connect muscle to bone. When you have tennis elbow, inflammation affects the tendons that you use to bend your wrist and move your hand up. Inflammation occurs in the lower part of the upper arm bone (humerus), where the tendons connect to the bone (lateral epicondyle). Tennis elbow often affects people who play tennis, but anyone may get the condition from repeatedly extending the wrist or turning the forearm. What are the causes? This condition is usually caused by repeatedly extending the wrist, turning the forearm, and using the hands. It can result from sports or work that requires repetitive forearm movements. In some cases, it may be caused by a sudden injury. What increases the  risk? You are more likely to develop tennis elbow if you play tennis or another racket sport. You also have a higher risk if you frequently use your hands for work. Besides people who play tennis, others at greater risk include: People who use computers. Holiday representative workers. People who work in [employer]. Musicians. Cooks. Cashiers. What are the signs or symptoms? Symptoms of this condition include: Pain and tenderness in the forearm and the outer part of the elbow. Pain may be felt only when using the arm, or it may be there all the time. A burning feeling that starts in the elbow and spreads down the forearm. A weak grip in the hand. How is this diagnosed? This condition is diagnosed based on your symptoms, your medical history, and a physical exam. You may also have X-rays or an MRI to: Confirm the diagnosis. Look for other issues. Check for tears in the ligaments, muscles, or tendons. How is this treated? Resting and icing your arm is often the first treatment. Your health care provider may also recommend: Medicines to reduce pain and inflammation. These may be in the form of a pill, topical gels, or shots of a steroid medicine (cortisone). An elbow strap to reduce stress on the area. Physical therapy. This may include massage or exercises or both. An elbow brace to restrict the movements that cause symptoms. If these treatments do not help relieve your symptoms, your health care provider may recommend surgery to remove damaged muscle and reattach healthy muscle to  bone. Follow these instructions at home: If you have a brace or strap: Wear the brace or strap as told by your health care provider. Remove it only as told by your health care provider. Check the skin around the brace or strap every day. Tell your health care provider about any concerns. Loosen the brace if your fingers tingle, become numb, or turn cold and blue. Keep the brace clean. If the brace or strap is not  waterproof: Do not let it get wet. Cover it with a watertight covering when you take a bath or a shower. Managing pain, stiffness, and swelling  If directed, put ice on the injured area. To do this: If you have a removable brace or strap, remove it as told by your health care provider. Put ice in a plastic bag. Place a towel between your skin and the bag. Leave the ice on for 20 minutes, 2-3 times a day. Remove the ice if your skin turns bright red. This is very important. If you cannot feel pain, heat, or cold, you have a greater risk of damage to the area. Move your fingers often to reduce stiffness and swelling. Activity Rest your elbow and wrist and avoid activities that cause symptoms as told by your health care provider. Do physical therapy exercises as told by your health care provider. If you lift an object, lift it with your palm facing up. This reduces stress on your elbow. Lifestyle If your tennis elbow is caused by sports, check your equipment and make sure that: You use it correctly. It is good match for you. If your tennis elbow is caused by work or computer use, take frequent breaks to stretch your arm. Talk with your employer about ways to manage your condition at work. General instructions Take over-the-counter and prescription medicines only as told by your health care provider. Do not use any products that contain nicotine or tobacco. These products include cigarettes, chewing tobacco, and vaping devices, such as e-cigarettes. If you need help quitting, ask your health care provider. Keep all follow-up visits. This is important. How is this prevented? Before and after activity: Warm up and stretch before being active. Cool down and stretch after being active. Give your body time to rest between periods of activity. During activity: Make sure to use equipment that fits you. If you play tennis, put power in your stroke with your lower body. Avoid using your arm  only. Maintain physical fitness, including: Strength. Flexibility. Endurance. Do exercises to strengthen the forearm muscles. Contact a health care provider if: You have pain that gets worse or does not get better with treatment. You have numbness or weakness in your forearm, hand, or fingers. Get help right away if: Your pain is severe. You cannot move your wrist. Summary Tennis elbow (lateral epicondylitis) is inflammation of tendons in your outer forearm, near your elbow. Common symptoms include pain and tenderness in your forearm and the outer part of your elbow. This condition is usually caused by repeatedly extending your wrist, turning your forearm, and using your hands. The first treatment is often resting and icing your arm to relieve symptoms. Further treatment may include taking medicine, getting physical therapy, wearing a brace or strap, or having surgery. This information is not intended to replace advice given to you by your health care provider. Make sure you discuss any questions you have with your health care provider. Document Revised: 01/29/2020 Document Reviewed: 01/29/2020 Elsevier Patient Education  2024 [employer].  Signed,   Meredith Staggers, MD [facility], [facility] 04/13/23 8:44 AM

## 2023-04-13 NOTE — Patient Instructions (Addendum)
Next recommended today.  No med changes for now.  I suspect your labs will look better with the weight loss, great work!  Elbow pain appears to be tennis elbow or lateral epicondylitis.  See information below.  Try to avoid downward grasp carrying as we discussed, and repetitive activity of the forearm, wrist.  Okay to use tennis elbow splint/counterforce brace over-the-counter if needed.  If that does not improve in the next few weeks, schedule a visit and we can discuss other treatments.  Tennis Elbow  Tennis elbow (lateral epicondylitis) is inflammation of tendons in your outer forearm, near your elbow. Tendons are tissues that connect muscle to bone. When you have tennis elbow, inflammation affects the tendons that you use to bend your wrist and move your hand up. Inflammation occurs in the lower part of the upper arm bone (humerus), where the tendons connect to the bone (lateral epicondyle). Tennis elbow often affects people who play tennis, but anyone may get the condition from repeatedly extending the wrist or turning the forearm. What are the causes? This condition is usually caused by repeatedly extending the wrist, turning the forearm, and using the hands. It can result from sports or work that requires repetitive forearm movements. In some cases, it may be caused by a sudden injury. What increases the risk? You are more likely to develop tennis elbow if you play tennis or another racket sport. You also have a higher risk if you frequently use your hands for work. Besides people who play tennis, others at greater risk include: People who use computers. Holiday representative workers. People who work in [employer]. Musicians. Cooks. Cashiers. What are the signs or symptoms? Symptoms of this condition include: Pain and tenderness in the forearm and the outer part of the elbow. Pain may be felt only when using the arm, or it may be there all the time. A burning feeling that starts in the elbow and  spreads down the forearm. A weak grip in the hand. How is this diagnosed? This condition is diagnosed based on your symptoms, your medical history, and a physical exam. You may also have X-rays or an MRI to: Confirm the diagnosis. Look for other issues. Check for tears in the ligaments, muscles, or tendons. How is this treated? Resting and icing your arm is often the first treatment. Your health care provider may also recommend: Medicines to reduce pain and inflammation. These may be in the form of a pill, topical gels, or shots of a steroid medicine (cortisone). An elbow strap to reduce stress on the area. Physical therapy. This may include massage or exercises or both. An elbow brace to restrict the movements that cause symptoms. If these treatments do not help relieve your symptoms, your health care provider may recommend surgery to remove damaged muscle and reattach healthy muscle to bone. Follow these instructions at home: If you have a brace or strap: Wear the brace or strap as told by your health care provider. Remove it only as told by your health care provider. Check the skin around the brace or strap every day. Tell your health care provider about any concerns. Loosen the brace if your fingers tingle, become numb, or turn cold and blue. Keep the brace clean. If the brace or strap is not waterproof: Do not let it get wet. Cover it with a watertight covering when you take a bath or a shower. Managing pain, stiffness, and swelling  If directed, put ice on the injured area. To do this:  If you have a removable brace or strap, remove it as told by your health care provider. Put ice in a plastic bag. Place a towel between your skin and the bag. Leave the ice on for 20 minutes, 2-3 times a day. Remove the ice if your skin turns bright red. This is very important. If you cannot feel pain, heat, or cold, you have a greater risk of damage to the area. Move your fingers often to reduce  stiffness and swelling. Activity Rest your elbow and wrist and avoid activities that cause symptoms as told by your health care provider. Do physical therapy exercises as told by your health care provider. If you lift an object, lift it with your palm facing up. This reduces stress on your elbow. Lifestyle If your tennis elbow is caused by sports, check your equipment and make sure that: You use it correctly. It is good match for you. If your tennis elbow is caused by work or computer use, take frequent breaks to stretch your arm. Talk with your employer about ways to manage your condition at work. General instructions Take over-the-counter and prescription medicines only as told by your health care provider. Do not use any products that contain nicotine or tobacco. These products include cigarettes, chewing tobacco, and vaping devices, such as e-cigarettes. If you need help quitting, ask your health care provider. Keep all follow-up visits. This is important. How is this prevented? Before and after activity: Warm up and stretch before being active. Cool down and stretch after being active. Give your body time to rest between periods of activity. During activity: Make sure to use equipment that fits you. If you play tennis, put power in your stroke with your lower body. Avoid using your arm only. Maintain physical fitness, including: Strength. Flexibility. Endurance. Do exercises to strengthen the forearm muscles. Contact a health care provider if: You have pain that gets worse or does not get better with treatment. You have numbness or weakness in your forearm, hand, or fingers. Get help right away if: Your pain is severe. You cannot move your wrist. Summary Tennis elbow (lateral epicondylitis) is inflammation of tendons in your outer forearm, near your elbow. Common symptoms include pain and tenderness in your forearm and the outer part of your elbow. This condition is usually  caused by repeatedly extending your wrist, turning your forearm, and using your hands. The first treatment is often resting and icing your arm to relieve symptoms. Further treatment may include taking medicine, getting physical therapy, wearing a brace or strap, or having surgery. This information is not intended to replace advice given to you by your health care provider. Make sure you discuss any questions you have with your health care provider. Document Revised: 01/29/2020 Document Reviewed: 01/29/2020 Elsevier Patient Education  2024 [employer].

## 2023-04-19 ENCOUNTER — Telehealth: Payer: Self-pay | Admitting: Neurology

## 2023-04-19 NOTE — Telephone Encounter (Signed)
Pt was scheduled for his initial CPAP on (06/01/23) Pt was informed to bring machine and power cord to the appointment.   DME and between dates are in pt's SnapShot.

## 2023-05-24 ENCOUNTER — Encounter: Payer: Self-pay | Admitting: Family Medicine

## 2023-05-24 DIAGNOSIS — M7712 Lateral epicondylitis, left elbow: Secondary | ICD-10-CM

## 2023-05-27 ENCOUNTER — Other Ambulatory Visit: Payer: Self-pay | Admitting: Medical Genetics

## 2023-05-27 DIAGNOSIS — Z006 Encounter for examination for normal comparison and control in clinical research program: Secondary | ICD-10-CM

## 2023-05-31 ENCOUNTER — Ambulatory Visit (INDEPENDENT_AMBULATORY_CARE_PROVIDER_SITE_OTHER): Payer: Medicare Other | Admitting: Orthopaedic Surgery

## 2023-05-31 ENCOUNTER — Other Ambulatory Visit (INDEPENDENT_AMBULATORY_CARE_PROVIDER_SITE_OTHER): Payer: Self-pay

## 2023-05-31 DIAGNOSIS — M25522 Pain in left elbow: Secondary | ICD-10-CM

## 2023-05-31 MED ORDER — BUPIVACAINE HCL 0.5 % IJ SOLN
1.0000 mL | INTRAMUSCULAR | Status: AC | PRN
  Administered 2023-05-31: 1 mL via INTRA_ARTICULAR

## 2023-05-31 MED ORDER — LIDOCAINE HCL 1 % IJ SOLN
1.0000 mL | INTRAMUSCULAR | Status: AC | PRN
  Administered 2023-05-31: 1 mL

## 2023-05-31 MED ORDER — METHYLPREDNISOLONE ACETATE 40 MG/ML IJ SUSP
40.0000 mg | INTRAMUSCULAR | Status: AC | PRN
  Administered 2023-05-31: 40 mg via INTRA_ARTICULAR

## 2023-05-31 NOTE — Progress Notes (Signed)
Office Visit Note   Patient: George Garner           Date of Birth: 1955-09-26           MRN: [id number] Visit Date: 05/31/2023              Requested by: Shade Flood, MD [address] [place],  [place] [address] PCP: Shade Flood, MD   Assessment & Plan: Visit Diagnoses:  1. 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  He had a good response to a cortisone injection last year he would like to repeat this today.  He will continue to wear the counterforce brace during activity.  He is currently not interested in physical therapy.  Will see him back as needed.  Follow-Up Instructions: No follow-ups on file.   Orders:  Orders Placed This Encounter  Procedures   Medium Joint Inj   XR Elbow Complete Left (3+View)   No orders of the defined types were placed in this encounter.     Procedures: Medium Joint Inj: L lateral epicondyle on 05/31/2023 8:43 AM Indications: pain Details: 25 G needle Medications: 1 mL lidocaine 1 %; 1 mL bupivacaine 0.5 %; 40 mg methylPREDNISolone acetate 40 MG/ML Outcome: tolerated well, no immediate complications Patient was prepped and draped in the usual sterile fashion.       Clinical Data: No additional findings.   Subjective: Chief Complaint  Patient presents with   Left Elbow - Pain    HPI George Garner is a 67 year old gentleman here for a lateral left elbow pain.  He has had this last year which he saw Dr. Pamalee Leyden for.  Did a cortisone injection which really helped.  He has been playing a lot of guitar.  Denies any numbness and tingling. Review of Systems  Constitutional: Negative.   HENT: Negative.    Eyes: Negative.   Respiratory: Negative.    Cardiovascular: Negative.   Gastrointestinal: Negative.   Endocrine: Negative.   Genitourinary: Negative.   Skin: Negative.   Allergic/Immunologic: Negative.   Neurological: Negative.   Hematological:  Negative.   Psychiatric/Behavioral: Negative.    All other systems reviewed and are negative.    Objective: Vital Signs: There were no vitals taken for this visit.  Physical Exam Vitals and nursing note reviewed.  Constitutional:      Appearance: He is well-developed.  Pulmonary:     Effort: Pulmonary effort is normal.  Abdominal:     Palpations: Abdomen is soft.  Skin:    General: Skin is warm.  Neurological:     Mental Status: He is alert and oriented to person, place, and time.  Psychiatric:        Behavior: Behavior normal.        Thought Content: Thought content normal.        Judgment: Judgment normal.     Ortho Exam Exam of the left elbow shows full range of motion.  He has point tenderness to the lateral epicondyle.  Slight pain with resistance to ECRB extension. Specialty Comments:  No specialty comments available.  Imaging: XR Elbow Complete Left (3+View)  Result Date: 05/31/2023 X-rays of the left elbow demonstrate no acute or structural abnormalities    PMFS History: Patient Active Problem List   Diagnosis Date Noted   Class 1 obesity due to excess calories without serious comorbidity with body mass index (BMI) of  34.0 to 34.9 in adult 11/23/2022   Prostate cancer (HCC) 01/10/2020   Fatigue 05/24/2018   Somnolence, daytime 05/24/2018   Obstructive sleep apnea treated with continuous positive airway pressure (CPAP) 01/31/2018   Snoring 09/28/2017   UARS (upper airway resistance syndrome) 09/28/2017   Retrognathia 09/28/2017   Allergic rhinitis due to allergen 09/28/2017   Obesity (BMI 30.0-34.9) 09/28/2017   Chronic pain of left knee 02/09/2017   Other seasonal allergic rhinitis 05/24/2014   Dyslipidemia    HYPOGODADISM MALE    Past Medical History:  Diagnosis Date   Allergy    Zyrtec daily.  Flonase PRN.   BPH (benign prostatic hyperplasia)    Dyslipidemia    Erectile dysfunction    GERD (gastroesophageal reflux disease)    Hyperlipidemia     Hypogonadism male    Obstructive sleep apnea    OSA on CPAP    Sleep apnea     Family History  Problem Relation Age of Onset   Diabetes Mother    Dementia Mother    Arthritis Brother    Colon cancer Neg Hx    Colon polyps Neg Hx    Esophageal cancer Neg Hx    Stomach cancer Neg Hx    Rectal cancer Neg Hx     Past Surgical History:  Procedure Laterality Date   CHOLECYSTECTOMY     COLONOSCOPY     deviated septum-cyst     EYE SURGERY     L eye tumor benign.   GALLBLADDER SURGERY     INGUINAL HERNIA REPAIR N/A 01/10/2020   Procedure: HERNIA REPAIR UMBILICAL, ADULT;  Surgeon: Heloise Purpura, MD;  Location: [facility];  Service: Urology;  Laterality: N/A;   LYMPHADENECTOMY Bilateral 01/10/2020   Procedure: LYMPHADENECTOMY, PELVIC;  Surgeon: Heloise Purpura, MD;  Location: [facility];  Service: Urology;  Laterality: Bilateral;   PILONIDAL CYST EXCISION     PROSTATE SURGERY N/A    Phreesia 08/26/2020   ROBOT ASSISTED LAPAROSCOPIC RADICAL PROSTATECTOMY N/A 01/10/2020   Procedure: XI ROBOTIC ASSISTED LAPAROSCOPIC RADICAL PROSTATECTOMY LEVEL 2;  Surgeon: Heloise Purpura, MD;  Location: [facility];  Service: Urology;  Laterality: N/A;   VASECTOMY     Social History   Occupational History   Occupation: Public house manager: [employer]  Tobacco Use   Smoking status: Former    Current packs/day: 0.00    Types: Cigarettes    Quit date: 2002    Years since quitting: 22.8   Smokeless tobacco: Never  Vaping Use   Vaping status: Never Used  Substance and Sexual Activity   Alcohol use: Yes    Alcohol/week: 14.0 standard drinks of alcohol    Types: 14 Cans of beer per week    Comment: 1-2 beers daily now hes retired   Drug use: No   Sexual activity: Yes    Comment: vasectomy

## 2023-06-01 ENCOUNTER — Telehealth: Payer: Self-pay

## 2023-06-01 ENCOUNTER — Encounter: Payer: Self-pay | Admitting: Adult Health

## 2023-06-01 ENCOUNTER — Ambulatory Visit (INDEPENDENT_AMBULATORY_CARE_PROVIDER_SITE_OTHER): Payer: Medicare Other | Admitting: Adult Health

## 2023-06-01 ENCOUNTER — Other Ambulatory Visit (HOSPITAL_COMMUNITY)
Admission: RE | Admit: 2023-06-01 | Discharge: 2023-06-01 | Disposition: A | Discharge status: Home / Self Care | Payer: Medicare Other | Source: Ambulatory Visit | Attending: Oncology | Admitting: Oncology

## 2023-06-01 VITALS — BP 143/79 | HR 62 | Ht 72.0 in | Wt 237.0 lb

## 2023-06-01 DIAGNOSIS — G4733 Obstructive sleep apnea (adult) (pediatric): Secondary | ICD-10-CM

## 2023-06-01 DIAGNOSIS — Z006 Encounter for examination for normal comparison and control in clinical research program: Secondary | ICD-10-CM | POA: Insufficient documentation

## 2023-06-01 NOTE — Patient Instructions (Signed)
Your Plan:  Continue nightly use of CPAP for sleep apnea management  Continue to follow with [facility] for any needed supplies or CPAP related concerns     Follow up in 1 year or call earlier if needed     Thank you for coming to see [place] at [facility]. I hope we have been able to provide you high quality care today.  You may receive a patient satisfaction survey over the next few weeks. We would appreciate your feedback and comments so that we may continue to improve ourselves and the health of our patients.

## 2023-06-01 NOTE — Progress Notes (Signed)
[facility] [address] [place]. [place] [address] [phone]       OFFICE FOLLOW UP NOTE  Mr. Esrom Soy Date of Birth:  Jan 18, 1956 Medical Record Number:  [id number]    Primary neurologist: Dr. Vickey Huger Reason for visit: Initial CPAP follow-up    SUBJECTIVE:   CHIEF COMPLAINT:  Chief Complaint  Patient presents with   Obstructive Sleep Apnea    Rm 3 alone Pt is well and stable, no OSA/CPAP concerns    Follow-up visit:  Prior visit: 11/23/2022 with Dr. Vickey Huger  Brief HPI:   Julion Rigoli is a 67 y.o. male who was initially evaluated by Dr. Vickey Huger in 09/2017 for CPAP management.  Prior sleep study about 10 years ago and placed on CPAP therapy, eventually stopped using it after losing 35 pounds but symptoms returned including daytime fatigue and snoring.  ESS 17/24.  Repeat sleep study 09/2017 confirmed sleep apnea and received new CPAP machine.  Compliance noted throughout routine follow-ups.  At prior visit, patient due for new machine.  HST 12/23/2022 confirmed presence of moderately to severe sleep apnea and received new CPAP machine on 04/05/2023.     Interval history:  Reports tolerating the CPAP well.  He is currently using CBD Gummies for sleep since June and has been able to sleep for longer duration without nighttime awakenings. ESS 5/24. FSS 9/63.  Followed by DME [facility]. No questions or concerns at this time.          ROS:   14 system review of systems performed and negative with exception of those listed in HPI  PMH:  Past Medical History:  Diagnosis Date   Allergy    Zyrtec daily.  Flonase PRN.   BPH (benign prostatic hyperplasia)    Dyslipidemia    Erectile dysfunction    GERD (gastroesophageal reflux disease)    Hyperlipidemia    Hypogonadism male    Obstructive sleep apnea    OSA on CPAP    Sleep apnea     PSH:  Past Surgical History:  Procedure Laterality Date   CHOLECYSTECTOMY     COLONOSCOPY      deviated septum-cyst     EYE SURGERY     L eye tumor benign.   GALLBLADDER SURGERY     INGUINAL HERNIA REPAIR N/A 01/10/2020   Procedure: HERNIA REPAIR UMBILICAL, ADULT;  Surgeon: Heloise Purpura, MD;  Location: [facility];  Service: Urology;  Laterality: N/A;   LYMPHADENECTOMY Bilateral 01/10/2020   Procedure: LYMPHADENECTOMY, PELVIC;  Surgeon: Heloise Purpura, MD;  Location: [facility];  Service: Urology;  Laterality: Bilateral;   PILONIDAL CYST EXCISION     PROSTATE SURGERY N/A    Phreesia 08/26/2020   ROBOT ASSISTED LAPAROSCOPIC RADICAL PROSTATECTOMY N/A 01/10/2020   Procedure: XI ROBOTIC ASSISTED LAPAROSCOPIC RADICAL PROSTATECTOMY LEVEL 2;  Surgeon: Heloise Purpura, MD;  Location: [facility];  Service: Urology;  Laterality: N/A;   VASECTOMY      Social History:  Social History   Socioeconomic History   Marital status: Married    Spouse name: Not on file   Number of children: Not on file   Years of education: Not on file   Highest education level: Master's degree (e.g., MA, MS, MEng, MEd, MSW, MBA)  Occupational History   Occupation: Public house manager: [employer]  Tobacco Use   Smoking status: Former    Current packs/day: 0.00    Types: Cigarettes    Quit date: 2002  Years since quitting: 22.8   Smokeless tobacco: Never  Vaping Use   Vaping status: Never Used  Substance and Sexual Activity   Alcohol use: Yes    Alcohol/week: 14.0 standard drinks of alcohol    Types: 14 Cans of beer per week    Comment: 1-2 beers daily now hes retired   Drug use: No   Sexual activity: Yes    Comment: vasectomy  Other Topics Concern   Not on file  Social History Narrative   Marital status: married x 26 years.      Children: 3 children (23, 21, 16); no grandchildren.      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      Tobacco: quit smoking 15 years ago.      Alcohol: rarely      Exercise:  3 times per week (weights,  cardio).      Seatbelt: 100%      Guns: none   Social Determinants of Health   Financial Resource Strain: Low Risk  (11/17/2022)   Overall Financial Resource Strain (CARDIA)    Difficulty of Paying Living Expenses: Not hard at all  Food Insecurity: No Food Insecurity (11/17/2022)   Hunger Vital Sign    Worried About Running Out of Food in the Last Year: Never true    Ran Out of Food in the Last Year: Never true  Transportation Needs: No Transportation Needs (11/17/2022)   PRAPARE - Administrator, Civil Service (Medical): No    Lack of Transportation (Non-Medical): No  Physical Activity: Insufficiently Active (11/17/2022)   Exercise Vital Sign    Days of Exercise per Week: 4 days    Minutes of Exercise per Session: 30 min  Stress: No Stress Concern Present (11/17/2022)   [employer] of Occupational Health - Occupational Stress Questionnaire    Feeling of Stress : Not at all  Social Connections: Socially Integrated (11/17/2022)   Social Connection and Isolation Panel [NHANES]    Frequency of Communication with Friends and Family: Twice a week    Frequency of Social Gatherings with Friends and Family: Twice a week    Attends Religious Services: More than 4 times per year    Active Member of [employer] or Organizations: Yes    Attends Engineer, structural: More than 4 times per year    Marital Status: Married  Catering manager Violence: Not At Risk (10/27/2022)   Humiliation, Afraid, Rape, and Kick questionnaire    Fear of Current or Ex-Partner: No    Emotionally Abused: No    Physically Abused: No    Sexually Abused: No    Family History:  Family History  Problem Relation Age of Onset   Diabetes Mother    Dementia Mother    Arthritis Brother    Colon cancer Neg Hx    Colon polyps Neg Hx    Esophageal cancer Neg Hx    Stomach cancer Neg Hx    Rectal cancer Neg Hx     Medications:   Current Outpatient Medications on File Prior to Visit  Medication Sig  Dispense Refill   acetaminophen (TYLENOL) 500 MG tablet Take 500 mg by mouth every 6 (six) hours as needed for mild pain or headache.     aspirin EC 81 MG tablet Take 81 mg by mouth daily. Swallow whole.     cetirizine (ZYRTEC) 10 MG tablet Take 1  tablet (10 mg total) by mouth daily. (Patient taking differently: Take 10 mg by mouth at bedtime.) 30 tablet 1   fluticasone (FLONASE) 50 MCG/ACT nasal spray Place 2 sprays into both nostrils daily. 16 g 1   ipratropium (ATROVENT) 0.06 % nasal spray Place 1-2 sprays into both nostrils 4 (four) times daily. As needed for nasal congestion 15 mL 5   montelukast (SINGULAIR) 10 MG tablet Take 1 tablet (10 mg total) by mouth daily. 90 tablet 3   Multiple Vitamin (MULTI-VITAMIN) tablet Take 1 tablet by mouth daily.     rosuvastatin (CRESTOR) 10 MG tablet Take 1 tablet (10 mg total) by mouth daily. 90 tablet 3   No current facility-administered medications on file prior to visit.    Allergies:   Allergies  Allergen Reactions   Penicillins Rash    Teen Years      OBJECTIVE:  Physical Exam  Vitals:   06/01/23 1330 06/01/23 1334  BP: (!) 152/78 (!) 143/79  Pulse: 62 62  Weight: 237 lb (107.5 kg)   Height: 6' (1.829 m)    Body mass index is 32.14 kg/m. No results found.   General: well developed, well nourished, very pleasant Caucasian male, seated, in no evident distress Head: head normocephalic and atraumatic.   Neck: supple with no carotid or supraclavicular bruits Cardiovascular: regular rate and rhythm, no murmurs  Neurologic Exam Mental Status: Awake and fully alert. Oriented to place and time. Recent and remote memory intact. Attention span, concentration and fund of knowledge appropriate. Mood and affect appropriate.  Cranial Nerves: Pupils equal, briskly reactive to light. Extraocular movements full without nystagmus. Visual fields full to confrontation. Hearing intact. Facial sensation intact. Face, tongue, palate moves normally  and symmetrically.  Motor: Normal bulk and tone. Normal strength in all tested extremity muscles Gait and Station: Arises from chair without difficulty. Stance is normal. Gait demonstrates normal stride length and balance without use of AD. Tandem walk and heel toe without difficulty.  Reflexes: 1+ and symmetric. Toes downgoing.         ASSESSMENT/PLAN: Harry Toler is a 67 y.o. year old male with longstanding history of sleep apnea on CPAP therapy.  Received new CPAP machine 04/2023.    OSA on CPAP : Compliance report shows satisfactory usage with optimal residual AHI.  Continue current pressure settings.  Discussed continued nightly usage with ensuring greater than 4 hours nightly for optimal benefit and per insurance purposes.  Continue to follow with DME company for any needed supplies or CPAP related concerns     Follow up in 1 year via MyChart video visit or call earlier if needed   CC:  PCP: Shade Flood, MD    I spent 25 minutes of face-to-face and non-face-to-face time with patient.  This included previsit chart review, lab review, study review, order entry, electronic health record documentation, patient education and discussion regarding above diagnoses and treatment plan and answered all other questions to patient's satisfaction  Ihor Austin, [facility]  [facility] [address] [place], [place] [address]  Phone [phone] Fax [phone] Note: This document was prepared with digital dictation and possible smart phrase technology. Any transcriptional errors that result from this process are unintentional.

## 2023-06-07 LAB — GENECONNECT MOLECULAR SCREEN

## 2023-06-07 LAB — HELIX MOLECULAR SCREEN: Genetic Analysis Overall Interpretation: NEGATIVE

## 2023-08-03 ENCOUNTER — Encounter: Payer: Self-pay | Admitting: Family Medicine

## 2023-08-03 DIAGNOSIS — J309 Allergic rhinitis, unspecified: Secondary | ICD-10-CM

## 2023-08-04 MED ORDER — MONTELUKAST SODIUM 10 MG PO TABS: 10.0000 mg | ORAL_TABLET | Freq: Every day | ORAL | 3 refills | Status: DC

## 2023-08-05 ENCOUNTER — Encounter (HOSPITAL_BASED_OUTPATIENT_CLINIC_OR_DEPARTMENT_OTHER): Payer: Self-pay

## 2023-08-09 MED ORDER — ROSUVASTATIN CALCIUM 10 MG PO TABS
10.0000 mg | ORAL_TABLET | Freq: Every day | ORAL | 3 refills | Status: DC
  Filled 2023-11-15 – 2024-02-02 (×2): qty 90, 90d supply, fill #0
  Filled 2024-05-01: qty 90, 90d supply, fill #1

## 2023-08-09 NOTE — Addendum Note (Signed)
 Addended by: Meredith Staggers R on: 08/09/2023 09:28 AM   Modules accepted: Orders

## 2023-09-15 ENCOUNTER — Other Ambulatory Visit (HOSPITAL_BASED_OUTPATIENT_CLINIC_OR_DEPARTMENT_OTHER): Payer: Self-pay

## 2023-09-15 MED ORDER — THYROID 60 MG PO TABS
60.0000 mg | ORAL_TABLET | Freq: Every day | ORAL | 1 refills | Status: DC
  Filled 2023-09-15: qty 30, 30d supply, fill #0
  Filled 2023-10-12 (×2): qty 30, 30d supply, fill #1

## 2023-09-16 ENCOUNTER — Other Ambulatory Visit (HOSPITAL_BASED_OUTPATIENT_CLINIC_OR_DEPARTMENT_OTHER): Payer: Self-pay

## 2023-10-12 ENCOUNTER — Ambulatory Visit (INDEPENDENT_AMBULATORY_CARE_PROVIDER_SITE_OTHER): Payer: Medicare Other | Admitting: Family Medicine

## 2023-10-12 ENCOUNTER — Other Ambulatory Visit: Payer: Self-pay

## 2023-10-12 ENCOUNTER — Other Ambulatory Visit (HOSPITAL_BASED_OUTPATIENT_CLINIC_OR_DEPARTMENT_OTHER): Payer: Self-pay

## 2023-10-12 ENCOUNTER — Encounter: Payer: Self-pay | Admitting: Family Medicine

## 2023-10-12 VITALS — BP 128/60 | HR 70 | Temp 98.9°F | Ht 72.0 in | Wt 241.0 lb

## 2023-10-12 DIAGNOSIS — G4733 Obstructive sleep apnea (adult) (pediatric): Secondary | ICD-10-CM | POA: Diagnosis not present

## 2023-10-12 DIAGNOSIS — R7303 Prediabetes: Secondary | ICD-10-CM

## 2023-10-12 DIAGNOSIS — E785 Hyperlipidemia, unspecified: Secondary | ICD-10-CM

## 2023-10-12 DIAGNOSIS — R351 Nocturia: Secondary | ICD-10-CM | POA: Diagnosis not present

## 2023-10-12 DIAGNOSIS — Z8639 Personal history of other endocrine, nutritional and metabolic disease: Secondary | ICD-10-CM | POA: Diagnosis not present

## 2023-10-12 LAB — COMPREHENSIVE METABOLIC PANEL
ALT: 29 U/L (ref 0–53)
AST: 31 U/L (ref 0–37)
Albumin: 4.6 g/dL (ref 3.5–5.2)
Alkaline Phosphatase: 51 U/L (ref 39–117)
BUN: 19 mg/dL (ref 6–23)
CO2: 30 meq/L (ref 19–32)
Calcium: 9.7 mg/dL (ref 8.4–10.5)
Chloride: 104 meq/L (ref 96–112)
Creatinine, Ser: 1.11 mg/dL (ref 0.40–1.50)
GFR: 68.18 mL/min (ref >60.00)
Glucose, Bld: 100 mg/dL — ABNORMAL HIGH (ref 70–99)
Potassium: 5.1 meq/L (ref 3.5–5.1)
Sodium: 138 meq/L (ref 135–145)
Total Bilirubin: 0.4 mg/dL (ref 0.2–1.2)
Total Protein: 7.2 g/dL (ref 6.0–8.3)

## 2023-10-12 LAB — LIPID PANEL
Cholesterol: 114 mg/dL (ref 0–200)
HDL: 37 mg/dL — ABNORMAL LOW (ref >39.00)
LDL Cholesterol: 63 mg/dL (ref 0–99)
NonHDL: 77.1
Total CHOL/HDL Ratio: 3
Triglycerides: 70 mg/dL (ref 0.0–149.0)
VLDL: 14 mg/dL (ref 0.0–40.0)

## 2023-10-12 LAB — HEMOGLOBIN A1C: Hgb A1c MFr Bld: 5.6 % (ref 4.6–6.5)

## 2023-10-12 MED ORDER — TAMSULOSIN HCL 0.4 MG PO CAPS
0.4000 mg | ORAL_CAPSULE | Freq: Every day | ORAL | 3 refills | Status: DC
Start: 2023-10-12 — End: 2023-12-01
  Filled 2023-10-12 (×2): qty 30, 30d supply, fill #0
  Filled 2023-11-08: qty 30, 30d supply, fill #1

## 2023-10-12 NOTE — Patient Instructions (Addendum)
 Sorry to hear about your persistent sleep issues. I would continue to have discussions with sleep specialist, but would also consider med like rozarem to allow longer sleep. May be worth discussing with your sleep specialist. We can try flomax again at bedtime to see if that helps with urinary symptoms. If no change or side effects, then stop that med. Discuss with urology next visit.   Recheck in 6 months, sooner if needed.

## 2023-10-12 NOTE — Progress Notes (Signed)
 Subjective:  Patient ID: George Garner, male    DOB: 1956/01/30  Age: 68 y.o. MRN: [id number]  CC:  Chief Complaint  Patient presents with   Medical Management of Chronic Issues    Pt is well, no concerns from patient    Health Maintenance    Patient has AWV scheduled, has not had a COVID -19 vaccine since September last year     HPI George Garner presents for   Hyperlipidemia: Crestor 10 mg daily.  Previous borderline LFTs normalized on repeat testing.  No new side effects/myalgias. Lab Results  Component Value Date   CHOL 124 04/13/2023   HDL 43.80 04/13/2023   LDLCALC 62 04/13/2023   TRIG 90.0 04/13/2023   CHOLHDL 3 04/13/2023   Lab Results  Component Value Date   ALT 28 04/13/2023   AST 23 04/13/2023   ALKPHOS 55 04/13/2023   BILITOT 0.7 04/13/2023   OSA on CPAP Upper airway resistance syndrome with retrognathia, evaluated by sleep specialist previously, CPAP nightly.  Has used THC gummy low-dose to help with sleep onset.  tolerating CPAP. Effective with sleep onset - 1030 or 11 pm, typically wakes at 430 or 5am. Going to the bathroom - has to urinate once per night. Has d/w sleep specialist. No changes recommended. Sleeps about 6 hours. Longstanding issue for years.  Prostatectomy in 2021. Regular urology follow up. Fluid avoidance before bedtime.    Occasional arthralgias in knees, no mechanical or debility symptoms, not limiting activity.   Allergic rhinitis Zyrtec, singular daily with Flonase during allergy season.  Also has Atrovent nasal spray if needed. Overall stable.   Prediabetes: Borderline with obesity.  Weight has increased slightly since testing in September, but has significantly improved from April to September.  Diet had improved with avoidance of starches and sugars and 2 mile walks per day - now 3 miles per day 5 days per week, weight machines 3 days per week.  Lab Results  Component Value Date   HGBA1C 5.7 04/13/2023   Wt Readings from Last 3  Encounters:  10/12/23 241 lb (109.3 kg)  06/01/23 237 lb (107.5 kg)  04/13/23 238 lb (108 kg)    History Patient Active Problem List   Diagnosis Date Noted   Class 1 obesity due to excess calories without serious comorbidity with body mass index (BMI) of 34.0 to 34.9 in adult 11/23/2022   Prostate cancer (HCC) 01/10/2020   Fatigue 05/24/2018   Somnolence, daytime 05/24/2018   Obstructive sleep apnea treated with continuous positive airway pressure (CPAP) 01/31/2018   Snoring 09/28/2017   UARS (upper airway resistance syndrome) 09/28/2017   Retrognathia 09/28/2017   Allergic rhinitis due to allergen 09/28/2017   Obesity (BMI 30.0-34.9) 09/28/2017   Other seasonal allergic rhinitis 05/24/2014   Dyslipidemia    HYPOGODADISM MALE    Past Medical History:  Diagnosis Date   Allergy    Zyrtec daily.  Flonase PRN.   BPH (benign prostatic hyperplasia)    Dyslipidemia    Erectile dysfunction    GERD (gastroesophageal reflux disease)    Hyperlipidemia    Hypogonadism male    Obstructive sleep apnea    OSA on CPAP    Sleep apnea    Past Surgical History:  Procedure Laterality Date   CHOLECYSTECTOMY     COLONOSCOPY     deviated septum-cyst     EYE SURGERY     L eye tumor benign.   GALLBLADDER SURGERY     INGUINAL HERNIA  REPAIR N/A 01/10/2020   Procedure: HERNIA REPAIR UMBILICAL, ADULT;  Surgeon: Heloise Purpura, MD;  Location: [facility];  Service: Urology;  Laterality: N/A;   LYMPHADENECTOMY Bilateral 01/10/2020   Procedure: LYMPHADENECTOMY, PELVIC;  Surgeon: Heloise Purpura, MD;  Location: [facility];  Service: Urology;  Laterality: Bilateral;   PILONIDAL CYST EXCISION     PROSTATE SURGERY N/A    [employer]rees[employer] 08/26/2020   ROBOT ASSISTED LAPAROSCOPIC RADICAL PROSTATECTOMY N/A 01/10/2020   Procedure: XI ROBOTIC ASSISTED LAPAROSCOPIC RADICAL PROSTATECTOMY LEVEL 2;  Surgeon: Heloise Purpura, MD;  Location: [facility];  Service: Urology;  Laterality: N/A;   VASECTOMY     Allergies  Allergen  Reactions   Penicillins Rash    Teen Years   Prior to Admission medications   Medication Sig Start Date End Date Taking? Authorizing Provider  acetaminophen (TYLENOL) 500 MG tablet Take 500 mg by mouth every 6 (six) hours as needed for mild pain or headache.   Yes [provider]  aspirin EC 81 MG tablet Take 81 mg by mouth daily. Swallow whole.   Yes [provider]  cetirizine (ZYRTEC) 10 MG tablet Take 1 tablet (10 mg total) by mouth daily. Patient taking differently: Take 10 mg by mouth at bedtime. 12/10/16  Yes McVey, Madelaine Bhat, PA-C  CHELATED IRON PO Take 30 mg by mouth.   Yes [provider]  Ferrous Gluconate (IRON 27 PO) Take 30 mg by mouth.   Yes [provider]  fluticasone (FLONASE) 50 MCG/ACT nasal spray Place 2 sprays into both nostrils daily. 12/10/16  Yes McVey, Madelaine Bhat, PA-C  ipratropium (ATROVENT) 0.06 % nasal spray Place 1-2 sprays into both nostrils 4 (four) times daily. As needed for nasal congestion 08/17/21  Yes Shade Flood, MD  montelukast (SINGULAIR) 10 MG tablet Take 1 tablet (10 mg total) by mouth daily. 08/04/23  Yes Shade Flood, MD  Multiple Vitamin (MULTI-VITAMIN) tablet Take 1 tablet by mouth daily. 01/13/16  Yes [provider]  rosuvastatin (CRESTOR) 10 MG tablet Take 1 tablet (10 mg total) by mouth daily. 08/09/23 08/03/24 Yes Shade Flood, MD  thyroid (NP THYROID) 60 MG tablet Take 1 tablet (60 mg total) by mouth daily on an empty stomach. 09/15/23  Yes    Social History   Socioeconomic History   Marital status: Married    Spouse name: Not on file   Number of children: Not on file   Years of education: Not on file   Highest education level: Master's degree (e.g., MA, MS, MEng, MEd, MSW, MBA)  Occupational History   Occupation: Public house manager: [employer]  Tobacco Use   Smoking status: Former    Current packs/day: 0.00    Types: Cigarettes    Quit date: 2002     Years since quitting: 23.2   Smokeless tobacco: Never  Vaping Use   Vaping status: Never Used  Substance and Sexual Activity   Alcohol use: Yes    Alcohol/week: 14.0 standard drinks of alcohol    Types: 14 Cans of beer per week    Comment: 1-2 beers daily now hes retired   Drug use: No   Sexual activity: Yes    Comment: vasectomy  Other Topics Concern   Not on file  Social History Narrative   Marital status: married x 26 years.      Children: 3 children (23, 21, 16); no grandchildren.      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      Tobacco: quit smoking 15 years ago.      Alcohol: rarely      Exercise:  3 times per week (weights, cardio).      Seatbelt: 100%      Guns: none   Social Drivers of Corporate investment banker Strain: Low Risk  (11/17/2022)   Overall Financial Resource Strain (CARDIA)    Difficulty of Paying Living Expenses: Not hard at all  Food Insecurity: No Food Insecurity (11/17/2022)   Hunger Vital Sign    Worried About Running Out of Food in the Last Year: Never true    Ran Out of Food in the Last Year: Never true  Transportation Needs: No Transportation Needs (11/17/2022)   PRAPARE - Administrator, Civil Service (Medical): No    Lack of Transportation (Non-Medical): No  Physical Activity: Insufficiently Active (11/17/2022)   Exercise Vital Sign    Days of Exercise per Week: 4 days    Minutes of Exercise per Session: 30 min  Stress: No Stress Concern Present (11/17/2022)   [employer] of Occupational Health - Occupational Stress Questionnaire    Feeling of Stress : Not at all  Social Connections: Socially Integrated (11/17/2022)   Social Connection and Isolation Panel [NHANES]    Frequency of Communication with Friends and Family: Twice a week    Frequency of Social Gatherings with Friends and Family: Twice a week    Attends Religious Services: More than 4 times per year     Active Member of [employer] or Organizations: Yes    Attends Engineer, structural: More than 4 times per year    Marital Status: Married  Catering manager Violence: Not At Risk (10/27/2022)   Humiliation, Afraid, Rape, and Kick questionnaire    Fear of Current or Ex-Partner: No    Emotionally Abused: No    Physically Abused: No    Sexually Abused: No    Review of Systems  Constitutional:  Negative for fatigue and unexpected weight change.  Eyes:  Negative for visual disturbance.  Respiratory:  Negative for cough, chest tightness and shortness of breath.   Cardiovascular:  Negative for chest pain, palpitations and leg swelling.  Gastrointestinal:  Negative for abdominal pain and blood in stool.  Neurological:  Negative for dizziness, light-headedness and headaches.     Objective:   Vitals:   10/12/23 0843  BP: 128/60  Pulse: 70  Temp: 98.9 F (37.2 C)  TempSrc: Temporal  SpO2: 98%  Weight: 241 lb (109.3 kg)  Height: 6' (1.829 m)     Physical Exam Vitals reviewed.  Constitutional:      Appearance: He is well-developed.  HENT:     Head: Normocephalic and atraumatic.  Neck:     Vascular: No carotid bruit or JVD.  Cardiovascular:     Rate and Rhythm: Normal rate and regular rhythm.     Heart sounds: Normal heart sounds. No murmur heard. Pulmonary:     Effort: Pulmonary effort is normal.     Breath sounds: Normal breath sounds. No rales.  Abdominal:     General: There is no distension.     Tenderness: There is no abdominal tenderness. There is no right CVA tenderness or left CVA tenderness.  Musculoskeletal:     Right lower leg: No edema.     Left lower leg: No edema.  Skin:    General: Skin is warm and dry.  Neurological:     Mental Status: He is alert and oriented to person, place, and time.  Psychiatric:        Mood and Affect: Mood normal.        Assessment & Plan:  George Garner is a 68 y.o. male . Dyslipidemia - Plan: Comprehensive metabolic  panel, Lipid panel  -Tolerating current dose statin, continue same, check labs and adjust plan accordingly  History of hyperglycemia - Plan: Comprehensive metabolic panel, Hemoglobin A1c Prediabetes  -Borderline prediabetes with A1c 5.7 previously.  Minimal weight change, but exercising regularly with weight lifting as well, resistance exercise.  Check labs and adjust plan accordingly, no new meds for now  OSA on CPAP Nocturia - Plan: tamsulosin (FLOMAX) 0.4 MG CAPS capsule  -Nightly CPAP, follow-up with sleep specialist without recent changes in regimen.  Still having early awakening.  Nocturia once per night.  Previous prostatectomy.  Question urinary contributor to his nighttime wakening.  Will try Flomax, has taken previously.  If no change in symptoms recommend discussion with sleep specialist and urology.  If any side effects of Flomax, stop med and let me know.  Episodic knee arthralgias discussed at end of visit.  Not debilitating, no mechanical symptoms.  Mentions as an FYI only at this time.  RTC precautions given if any persistent or worsening pain, for further evaluation.  Understanding expressed.  Meds ordered this encounter  Medications   tamsulosin (FLOMAX) 0.4 MG CAPS capsule    Sig: Take 1 capsule (0.4 mg total) by mouth daily.    Dispense:  30 capsule    Refill:  3   Patient Instructions  Sorry to hear about your persistent sleep issues. I would continue to have discussions with sleep specialist, but would also consider med like rozarem to allow longer sleep. May be worth discussing with your sleep specialist. We can try flomax again at bedtime to see if that helps with urinary symptoms. If no change or side effects, then stop that med. Discuss with urology next visit.   Recheck in 6 months, sooner if needed.     Signed,   Meredith Staggers, MD [facility], [facility] 10/12/23 9:24 AM

## 2023-10-15 ENCOUNTER — Encounter: Payer: Self-pay | Admitting: Family Medicine

## 2023-11-08 ENCOUNTER — Ambulatory Visit (INDEPENDENT_AMBULATORY_CARE_PROVIDER_SITE_OTHER): Admitting: *Deleted

## 2023-11-08 ENCOUNTER — Other Ambulatory Visit: Payer: Self-pay

## 2023-11-08 ENCOUNTER — Other Ambulatory Visit (HOSPITAL_BASED_OUTPATIENT_CLINIC_OR_DEPARTMENT_OTHER): Payer: Self-pay

## 2023-11-08 DIAGNOSIS — Z0000 Encounter for general adult medical examination without abnormal findings: Secondary | ICD-10-CM | POA: Diagnosis not present

## 2023-11-08 NOTE — Patient Instructions (Signed)
 [name] , Thank you for taking time to come for your Medicare Wellness Visit. I appreciate your ongoing commitment to your health goals. Please review the following plan we discussed and let me know if I can assist you in the future.   Screening recommendations/referrals: Colonoscopy: up to date Recommended yearly ophthalmology/optometry visit for glaucoma screening and checkup Recommended yearly dental visit for hygiene and checkup  Vaccinations: Influenza vaccine: up to date Pneumococcal vaccine: up to date Tdap vaccine: up to date Shingles vaccine: up to date      Preventive Care 65 Years and Older, Male Preventive care refers to lifestyle choices and visits with your health care provider that can promote health and wellness. What does preventive care include? A yearly physical exam. This is also called an annual well check. Dental exams once or twice a year. Routine eye exams. Ask your health care provider how often you should have your eyes checked. Personal lifestyle choices, including: Daily care of your teeth and gums. Regular physical activity. Eating a healthy diet. Avoiding tobacco and drug use. Limiting alcohol use. Practicing safe sex. Taking low doses of aspirin every day. Taking vitamin and mineral supplements as recommended by your health care provider. What happens during an annual well check? The services and screenings done by your health care provider during your annual well check will depend on your age, overall health, lifestyle risk factors, and family history of disease. Counseling  Your health care provider may ask you questions about your: Alcohol use. Tobacco use. Drug use. Emotional well-being. Home and relationship well-being. Sexual activity. Eating habits. History of falls. Memory and ability to understand (cognition). Work and work Astronomer. Screening  You may have the following tests or measurements: Height, weight, and BMI. Blood  pressure. Lipid and cholesterol levels. These may be checked every 5 years, or more frequently if you are over 46 years old. Skin check. Lung cancer screening. You may have this screening every year starting at age 69 if you have a 30-pack-year history of smoking and currently smoke or have quit within the past 15 years. Fecal occult blood test (FOBT) of the stool. You may have this test every year starting at age 69. Flexible sigmoidoscopy or colonoscopy. You may have a sigmoidoscopy every 5 years or a colonoscopy every 10 years starting at age 74. Prostate cancer screening. Recommendations will vary depending on your family history and other risks. Hepatitis C blood test. Hepatitis B blood test. Sexually transmitted disease (STD) testing. Diabetes screening. This is done by checking your blood sugar (glucose) after you have not eaten for a while (fasting). You may have this done every 1-3 years. Abdominal aortic aneurysm (AAA) screening. You may need this if you are a current or former smoker. Osteoporosis. You may be screened starting at age 69 if you are at high risk. Talk with your health care provider about your test results, treatment options, and if necessary, the need for more tests. Vaccines  Your health care provider may recommend certain vaccines, such as: Influenza vaccine. This is recommended every year. Tetanus, diphtheria, and acellular pertussis (Tdap, Td) vaccine. You may need a Td booster every 10 years. Zoster vaccine. You may need this after age 61. Pneumococcal 13-valent conjugate (PCV13) vaccine. One dose is recommended after age 20. Pneumococcal polysaccharide (PPSV23) vaccine. One dose is recommended after age 51. Talk to your health care provider about which screenings and vaccines you need and how often you need them. This information is not intended  to replace advice given to you by your health care provider. Make sure you discuss any questions you have with your  health care provider. Document Released: 08/15/2015 Document Revised: 04/07/2016 Document Reviewed: 05/20/2015 Elsevier Interactive Patient Education  2017 [employer].  Fall Prevention in the Home Falls can cause injuries. They can happen to people of all ages. There are many things you can do to make your home safe and to help prevent falls. What can I do on the outside of my home? Regularly fix the edges of walkways and driveways and fix any cracks. Remove anything that might make you trip as you walk through a door, such as a raised step or threshold. Trim any bushes or trees on the path to your home. Use bright outdoor lighting. Clear any walking paths of anything that might make someone trip, such as rocks or tools. Regularly check to see if handrails are loose or broken. Make sure that both sides of any steps have handrails. Any raised decks and porches should have guardrails on the edges. Have any leaves, snow, or ice cleared regularly. Use sand or salt on walking paths during winter. Clean up any spills in your garage right away. This includes oil or grease spills. What can I do in the bathroom? Use night lights. Install grab bars by the toilet and in the tub and shower. Do not use towel bars as grab bars. Use non-skid mats or decals in the tub or shower. If you need to sit down in the shower, use a plastic, non-slip stool. Keep the floor dry. Clean up any water that spills on the floor as soon as it happens. Remove soap buildup in the tub or shower regularly. Attach bath mats securely with double-sided non-slip rug tape. Do not have throw rugs and other things on the floor that can make you trip. What can I do in the bedroom? Use night lights. Make sure that you have a light by your bed that is easy to reach. Do not use any sheets or blankets that are too big for your bed. They should not hang down onto the floor. Have a firm chair that has side arms. You can use this for  support while you get dressed. Do not have throw rugs and other things on the floor that can make you trip. What can I do in the kitchen? Clean up any spills right away. Avoid walking on wet floors. Keep items that you use a lot in easy-to-reach places. If you need to reach something above you, use a strong step stool that has a grab bar. Keep electrical cords out of the way. Do not use floor polish or wax that makes floors slippery. If you must use wax, use non-skid floor wax. Do not have throw rugs and other things on the floor that can make you trip. What can I do with my stairs? Do not leave any items on the stairs. Make sure that there are handrails on both sides of the stairs and use them. Fix handrails that are broken or loose. Make sure that handrails are as long as the stairways. Check any carpeting to make sure that it is firmly attached to the stairs. Fix any carpet that is loose or worn. Avoid having throw rugs at the top or bottom of the stairs. If you do have throw rugs, attach them to the floor with carpet tape. Make sure that you have a light switch at the top of the stairs and  the bottom of the stairs. If you do not have them, ask someone to add them for you. What else can I do to help prevent falls? Wear shoes that: Do not have high heels. Have rubber bottoms. Are comfortable and fit you well. Are closed at the toe. Do not wear sandals. If you use a stepladder: Make sure that it is fully opened. Do not climb a closed stepladder. Make sure that both sides of the stepladder are locked into place. Ask someone to hold it for you, if possible. Clearly mark and make sure that you can see: Any grab bars or handrails. First and last steps. Where the edge of each step is. Use tools that help you move around (mobility aids) if they are needed. These include: Canes. Walkers. Scooters. Crutches. Turn on the lights when you go into a dark area. Replace any light bulbs as soon  as they burn out. Set up your furniture so you have a clear path. Avoid moving your furniture around. If any of your floors are uneven, fix them. If there are any pets around you, be aware of where they are. Review your medicines with your doctor. Some medicines can make you feel dizzy. This can increase your chance of falling. Ask your doctor what other things that you can do to help prevent falls. This information is not intended to replace advice given to you by your health care provider. Make sure you discuss any questions you have with your health care provider. Document Released: 05/15/2009 Document Revised: 12/25/2015 Document Reviewed: 08/23/2014 Elsevier Interactive Patient Education  2017 [employer].

## 2023-11-08 NOTE — Progress Notes (Signed)
 Subjective:   George Garner is a 68 y.o. male who presents for Medicare Annual/Subsequent preventive examination.  Visit Complete: Virtual I connected with  Jolayne Haines on 11/08/23 by a audio enabled telemedicine application and verified that I am speaking with the correct person using two identifiers.  Patient Location: Home  Provider Location: Home Office  I discussed the limitations of evaluation and management by telemedicine. The patient expressed understanding and agreed to proceed.  Vital Signs: Because this visit was a virtual/telehealth visit, some criteria may be missing or patient reported. Any vitals not documented were not able to be obtained and vitals that have been documented are patient reported.  Cardiac Risk Factors include: advanced age (>86men, >8 women);male gender     Objective:    There were no vitals filed for this visit. There is no height or weight on file to calculate BMI.     11/08/2023    8:17 AM 10/27/2022    8:08 AM 08/29/2020    8:08 AM 01/10/2020    3:00 PM 01/02/2020    8:18 AM 09/01/2015    8:02 AM  Advanced Directives  Does Patient Have a Medical Advance Directive? No Yes Yes Yes Yes No  Type of Special educational needs teacher of [employer] Power of [place];Living will Healthcare Power of [place];Living will Healthcare Power of [place];Living will   Does patient want to make changes to medical advance directive?    No - Guardian declined No - Patient declined   Copy of Healthcare Power of Attorney in Chart?  No - copy requested No - copy requested     Would patient like information on creating a medical advance directive? No - Patient declined   No - Patient declined No - Patient declined Yes - Educational materials given    Current Medications (verified) Outpatient Encounter Medications as of 11/08/2023  Medication Sig   acetaminophen (TYLENOL) 500 MG tablet Take 500 mg by mouth every 6 (six) hours as needed for mild pain or  headache.   aspirin EC 81 MG tablet Take 81 mg by mouth daily. Swallow whole.   cetirizine (ZYRTEC) 10 MG tablet Take 1 tablet (10 mg total) by mouth daily. (Patient taking differently: Take 10 mg by mouth at bedtime.)   CHELATED IRON PO Take 30 mg by mouth.   Ferrous Gluconate (IRON 27 PO) Take 30 mg by mouth.   fluticasone (FLONASE) 50 MCG/ACT nasal spray Place 2 sprays into both nostrils daily.   ipratropium (ATROVENT) 0.06 % nasal spray Place 1-2 sprays into both nostrils 4 (four) times daily. As needed for nasal congestion   montelukast (SINGULAIR) 10 MG tablet Take 1 tablet (10 mg total) by mouth daily.   Multiple Vitamin (MULTI-VITAMIN) tablet Take 1 tablet by mouth daily.   rosuvastatin (CRESTOR) 10 MG tablet Take 1 tablet (10 mg total) by mouth daily.   tamsulosin (FLOMAX) 0.4 MG CAPS capsule Take 1 capsule (0.4 mg total) by mouth daily.   thyroid (NP THYROID) 60 MG tablet Take 1 tablet (60 mg total) by mouth daily on an empty stomach.   No facility-administered encounter medications on file as of 11/08/2023.    Allergies (verified) Penicillins   History: Past Medical History:  Diagnosis Date   Allergy    Zyrtec daily.  Flonase PRN.   BPH (benign prostatic hyperplasia)    Dyslipidemia    Erectile dysfunction    GERD (gastroesophageal reflux disease)    Hyperlipidemia    Hypogonadism  male    Obstructive sleep apnea    OSA on CPAP    Sleep apnea    Past Surgical History:  Procedure Laterality Date   CHOLECYSTECTOMY     COLONOSCOPY     deviated septum-cyst     EYE SURGERY     L eye tumor benign.   GALLBLADDER SURGERY     INGUINAL HERNIA REPAIR N/A 01/10/2020   Procedure: HERNIA REPAIR UMBILICAL, ADULT;  Surgeon: Heloise Purpura, MD;  Location: [facility];  Service: Urology;  Laterality: N/A;   LYMPHADENECTOMY Bilateral 01/10/2020   Procedure: LYMPHADENECTOMY, PELVIC;  Surgeon: Heloise Purpura, MD;  Location: [facility];  Service: Urology;  Laterality: Bilateral;   PILONIDAL CYST  EXCISION     PROSTATE SURGERY N/A    [employer] 08/26/2020   ROBOT ASSISTED LAPAROSCOPIC RADICAL PROSTATECTOMY N/A 01/10/2020   Procedure: XI ROBOTIC ASSISTED LAPAROSCOPIC RADICAL PROSTATECTOMY LEVEL 2;  Surgeon: Heloise Purpura, MD;  Location: [facility];  Service: Urology;  Laterality: N/A;   VASECTOMY     Family History  Problem Relation Age of Onset   Diabetes Mother    Dementia Mother    Arthritis Brother    Colon cancer Neg Hx    Colon polyps Neg Hx    Esophageal cancer Neg Hx    Stomach cancer Neg Hx    Rectal cancer Neg Hx    Social History   Socioeconomic History   Marital status: Married    Spouse name: Not on file   Number of children: Not on file   Years of education: Not on file   Highest education level: Master's degree (e.g., MA, MS, MEng, MEd, MSW, MBA)  Occupational History   Occupation: Public house manager: [employer]  Tobacco Use   Smoking status: Former    Current packs/day: 0.00    Types: Cigarettes    Quit date: 2002    Years since quitting: 23.2   Smokeless tobacco: Never  Vaping Use   Vaping status: Never Used  Substance and Sexual Activity   Alcohol use: Yes    Alcohol/week: 14.0 standard drinks of alcohol    Types: 14 Cans of beer per week    Comment: 1-2 beers daily now hes retired   Drug use: No   Sexual activity: Yes    Comment: vasectomy  Other Topics Concern   Not on file  Social History Narrative   Marital status: married x 26 years.      Children: 3 children (23, 21, 16); no grandchildren.      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      Tobacco: quit smoking 15 years ago.      Alcohol: rarely      Exercise:  3 times per week (weights, cardio).      Seatbelt: 100%      Guns: none   Social Drivers of Corporate investment banker Strain: Low Risk  (11/08/2023)   Overall Financial Resource Strain (CARDIA)    Difficulty of Paying Living Expenses: Not hard at all   Food Insecurity: No Food Insecurity (11/08/2023)   Hunger Vital Sign    Worried About Running Out of Food in the Last Year: Never true    Ran Out of Food in the Last Year: Never true  Transportation Needs: No Transportation Needs (11/08/2023)   PRAPARE - Administrator, Civil Service (Medical): No  Lack of Transportation (Non-Medical): No  Physical Activity: Sufficiently Active (11/08/2023)   Exercise Vital Sign    Days of Exercise per Week: 5 days    Minutes of Exercise per Session: 60 min  Stress: No Stress Concern Present (11/08/2023)   [employer] of Occupational Health - Occupational Stress Questionnaire    Feeling of Stress : Not at all  Social Connections: Socially Integrated (11/08/2023)   Social Connection and Isolation Panel [NHANES]    Frequency of Communication with Friends and Family: More than three times a week    Frequency of Social Gatherings with Friends and Family: More than three times a week    Attends Religious Services: More than 4 times per year    Active Member of [employer] or Organizations: Yes    Attends Engineer, structural: More than 4 times per year    Marital Status: Married    Tobacco Counseling Counseling given: Not Answered   Clinical Intake:  Pre-visit preparation completed: Yes  Pain : No/denies pain        How often do you need to have someone help you when you read instructions, pamphlets, or other written materials from your doctor or pharmacy?: 1 - Never  Interpreter Needed?: No  Information entered by :: Remi Haggard LPN   Activities of Daily Living    11/08/2023    8:16 AM  In your present state of health, do you have any difficulty performing the following activities:  Hearing? 0  Vision? 0  Difficulty concentrating or making decisions? 0  Walking or climbing stairs? 0  Dressing or bathing? 0  Doing errands, shopping? 0  Preparing Food and eating ? N  Using the Toilet? N  In the past six months, have  you accidently leaked urine? N  Do you have problems with loss of bowel control? N  Managing your Medications? N  Managing your Finances? N  Housekeeping or managing your Housekeeping? N    Patient Care Team: Shade Flood, MD as PCP - General (Family Medicine) Jodelle Red, MD as PCP - Cardiology (Cardiology)  Indicate any recent Medical Services you may have received from other than [facility] providers in the past year (date may be approximate).     Assessment:   This is a routine wellness examination for Michio.  Hearing/Vision screen Hearing Screening - Comments:: Bilateral hearing aids  Vision Screening - Comments:: Up to  date [facility]   Goals Addressed             This Visit's Progress    Weight (lb) < 200 lb (90.7 kg)         Depression Screen    11/08/2023    8:19 AM 10/12/2023    8:46 AM 04/13/2023    8:13 AM 10/27/2022    8:06 AM 10/07/2022    8:42 AM 04/21/2022   10:50 AM 01/27/2022    2:42 PM  PHQ 2/9 Scores  PHQ - 2 Score 0 0 0 0 0 0 0  PHQ- 9 Score 6 0 0 2 1 3      Fall Risk    11/08/2023    8:13 AM 10/12/2023    8:46 AM 04/13/2023    8:13 AM 10/27/2022    8:16 AM 10/07/2022    8:41 AM  Fall Risk   Falls in the past year? 1 0 0 0 0  Number falls in past yr: 0 0 0 0 0  Injury with Fall? 0 0  0 0 0  Risk for fall due to :  No Fall Risks No Fall Risks  No Fall Risks  Follow up Falls evaluation completed;Education provided;Falls prevention discussed Falls evaluation completed Falls evaluation completed Falls evaluation completed;Education provided;Falls prevention discussed Falls evaluation completed    MEDICARE RISK AT HOME: Medicare Risk at Home Any stairs in or around the home?: Yes If so, are there any without handrails?: No Home free of loose throw rugs in walkways, pet beds, electrical cords, etc?: Yes Adequate lighting in your home to reduce risk of falls?: Yes Life alert?: No Use of a cane, walker or w/c?: No Grab  bars in the bathroom?: No Shower chair or bench in shower?: No Elevated toilet seat or a handicapped toilet?: No  TIMED UP AND GO:  Was the test performed?  No    Cognitive Function:        11/08/2023    8:16 AM 10/27/2022    8:07 AM 10/01/2021    8:06 AM 08/29/2020    8:05 AM  6CIT Screen  What Year? 0 points 0 points 0 points 0 points  What month? 0 points 0 points 0 points 0 points  What time? 0 points 0 points 0 points 0 points  Count back from 20 0 points 0 points 0 points 0 points  Months in reverse 0 points 0 points 0 points 0 points  Repeat phrase 0 points 0 points 0 points 0 points  Total Score 0 points 0 points 0 points 0 points    Immunizations Immunization History  Administered Date(s) Administered   Fluad Quad(high Dose 65+) 04/10/2021, 05/27/2022   Fluad Trivalent(High Dose 65+) 04/13/2023   Influenza Inj Mdck Quad Pf 08/12/2017   Influenza Whole 04/29/2011   Influenza,inj,Quad PF,6+ Mos 05/13/2014, 06/23/2015, 07/02/2016, 08/12/2017, 05/26/2018, 04/04/2019   Influenza-Unspecified 05/01/2021   PFIZER Comirnaty(Gray Top)Covid-19 Tri-Sucrose Vaccine 09/21/2019, 10/15/2019, 05/09/2020, 02/13/2021   Pneumococcal Conjugate-13 08/29/2020   Pneumococcal Polysaccharide-23 10/01/2021   Tdap 06/08/2006, 07/02/2016, 01/22/2022   Unspecified SARS-COV-2 Vaccination 05/27/2022   Zoster Recombinant(Shingrix) 02/13/2021, 06/05/2021   Zoster, Live 08/03/2011    TDAP status: Up to date  Flu Vaccine status: Up to date  Pneumococcal vaccine status: Up to date  Covid-19 vaccine status: Information provided on how to obtain vaccines.   Qualifies for Shingles Vaccine? No   Zostavax completed Yes   Shingrix Completed?: Yes  Screening Tests Health Maintenance  Topic Date Due   COVID-19 Vaccine (6 - 2024-25 season) 04/03/2023   INFLUENZA VACCINE  03/02/2024   Medicare Annual Wellness (AWV)  11/07/2024   Colonoscopy  04/14/2028   DTaP/Tdap/Td (4 - Td or Tdap) 01/23/2032    Pneumonia Vaccine 68+ Years old  Completed   Hepatitis C Screening  Completed   Zoster Vaccines- Shingrix  Completed   HPV VACCINES  Aged Out    Health Maintenance  Health Maintenance Due  Topic Date Due   COVID-19 Vaccine (6 - 2024-25 season) 04/03/2023    Colorectal cancer screening: Type of screening: Colonoscopy. Completed 2019. Repeat every 10 years  Lung Cancer Screening: (Low Dose CT Chest recommended if Age 15-80 years, 20 pack-year currently smoking OR have quit w/in 15years.) does not qualify.   Lung Cancer Screening Referral:   Additional Screening:  Hepatitis C Screening: does not qualify; Completed 2019  Vision Screening: Recommended annual ophthalmology exams for early detection of glaucoma and other disorders of the eye. Is the patient up to date with their annual eye exam?  Yes  Who  is the provider or what is the name of the office in which the patient attends annual eye exams? [facility] If pt is not established with a provider, would they like to be referred to a provider to establish care? No .   Dental Screening: Recommended annual dental exams for proper oral hygiene   Community Resource Referral / Chronic Care Management: CRR required this visit?  No   CCM required this visit?  No     Plan:     I have personally reviewed and noted the following in the patient's chart:   Medical and social history Use of alcohol, tobacco or illicit drugs  Current medications and supplements including opioid prescriptions. Patient is not currently taking opioid prescriptions. Functional ability and status Nutritional status Physical activity Advanced directives List of other physicians Hospitalizations, surgeries, and ER visits in previous 12 months Vitals Screenings to include cognitive, depression, and falls Referrals and appointments  In addition, I have reviewed and discussed with patient certain preventive protocols, quality metrics, and best  practice recommendations. A written personalized care plan for preventive services as well as general preventive health recommendations were provided to patient.     Remi Haggard, LPN   01/31/5365   After Visit Summary: (MyChart) Due to this being a telephonic visit, the after visit summary with patients personalized plan was offered to patient via MyChart   Nurse Notes:

## 2023-11-14 ENCOUNTER — Other Ambulatory Visit (HOSPITAL_BASED_OUTPATIENT_CLINIC_OR_DEPARTMENT_OTHER): Payer: Self-pay

## 2023-11-14 ENCOUNTER — Other Ambulatory Visit: Payer: Self-pay

## 2023-11-14 MED ORDER — THYROID 30 MG PO TABS
30.0000 mg | ORAL_TABLET | Freq: Every day | ORAL | 1 refills | Status: DC
  Filled 2023-11-14: qty 30, 30d supply, fill #0

## 2023-11-14 MED ORDER — MONTELUKAST SODIUM 10 MG PO TABS
10.0000 mg | ORAL_TABLET | Freq: Every day | ORAL | 3 refills | Status: DC
  Filled 2023-11-15: qty 90, 90d supply, fill #0

## 2023-11-15 ENCOUNTER — Other Ambulatory Visit (HOSPITAL_BASED_OUTPATIENT_CLINIC_OR_DEPARTMENT_OTHER): Payer: Self-pay

## 2023-11-21 ENCOUNTER — Other Ambulatory Visit (HOSPITAL_BASED_OUTPATIENT_CLINIC_OR_DEPARTMENT_OTHER): Payer: Self-pay

## 2023-11-21 MED ORDER — THYROID 60 MG PO TABS
60.0000 mg | ORAL_TABLET | Freq: Every day | ORAL | 1 refills | Status: DC
  Filled 2023-11-21: qty 30, 30d supply, fill #0

## 2023-11-22 ENCOUNTER — Other Ambulatory Visit (HOSPITAL_BASED_OUTPATIENT_CLINIC_OR_DEPARTMENT_OTHER): Payer: Self-pay

## 2023-11-30 ENCOUNTER — Encounter: Payer: Self-pay | Admitting: Neurology

## 2023-11-30 NOTE — Telephone Encounter (Signed)
 Spoke w/Pt regarding sleep concerns. Gave Pt option for appt for tomorrow with MD. Pt accepted appt. Reminded Pt to be here 30 min prior to appt time. Pt stated understanding.

## 2023-11-30 NOTE — Progress Notes (Signed)
 George Garner

## 2023-12-01 ENCOUNTER — Ambulatory Visit: Admitting: Neurology

## 2023-12-01 ENCOUNTER — Encounter: Payer: Self-pay | Admitting: Neurology

## 2023-12-01 VITALS — BP 120/60 | HR 66 | Ht 72.0 in | Wt 242.8 lb

## 2023-12-01 DIAGNOSIS — G478 Other sleep disorders: Secondary | ICD-10-CM | POA: Diagnosis not present

## 2023-12-01 DIAGNOSIS — G4733 Obstructive sleep apnea (adult) (pediatric): Secondary | ICD-10-CM | POA: Diagnosis not present

## 2023-12-01 DIAGNOSIS — E66811 Obesity, class 1: Secondary | ICD-10-CM | POA: Diagnosis not present

## 2023-12-01 NOTE — Progress Notes (Signed)
 Provider:  Neomia Banner, MD  Primary Care Physician:  Benjiman Bras, MD [address] [place] [address]     Referring Provider: Benjiman Bras, Md [address] [place],  [place] [address]          Chief Complaint according to patient   Patient presents with:                HISTORY OF PRESENT ILLNESS:  George Garner is a 68 y.o. male patient who is here for revisit 12/01/2023 for OSA on CPAP,  well controlled and highly compliant.   He wakes up to urinate at about 5.30-6.30  hours into sleep, and that's the end of sleep for him.  He falls asleep easily at night in front of the TV, sleep onset is 10.30. he can't  take scheduled naps- but does take inadvertently naps,  which tells me he is not that sleepy.   He gets a enough sleep to function.  Naps make him groggy, even power naps.  He restricted his fluid intake before bedtime.   He walks  4 miles a day,  He eats sensibly , 2000 kcal, high protein.  And yet he can't seem to lose weight.  He had prostate cancer, and had a prostatectomy 2022.    Chief concern according to patient : " I think my lack of deep sleep is hindering my weight loss "     11/23/2022 with Dr. Albertina Hugger   Brief HPI:  [facility], NP    George Garner is a 68 y.o. male who was initially evaluated by Dr. Albertina Hugger in 09/2017 for CPAP management.  Prior sleep study about 10 years ago and placed on CPAP therapy, eventually stopped using it after losing 35 pounds but symptoms returned including daytime fatigue and snoring.  ESS 17/24.  Repeat sleep study 09/2017 confirmed sleep apnea and received new CPAP machine.  Compliance noted throughout routine follow-ups.   At prior visit, patient due for new machine.  HST 12/23/2022 confirmed presence of moderately to severe sleep apnea and received new CPAP machine on 04/05/2023.     Review of Systems: Out of a complete 14 system review, the patient complains of only the following symptoms, and  all other reviewed systems are negative.:   How likely are you to doze in the following situations: 0 = not likely, 1 = slight chance, 2 = moderate chance, 3 = high chance  Sitting and Reading? 3 Watching Television? 3 Sitting inactive in a public place (theater or meeting)?3 Lying down in the afternoon when circumstances permit?0  Sitting and talking to someone? 0 Sitting quietly after lunch without alcohol?1 In a car, while stopped for a few minutes in traffic? 0 As a passenger in a car for an hour without a break?0   Total =  10/ 24   FSS ?  GDS : 2/ 15   Social History   Socioeconomic History   Marital status: Married    Spouse name: Not on file   Number of children: Not on file   Years of education: Not on file   Highest education level: Master's degree (e.g., MA, MS, MEng, MEd, MSW, MBA)  Occupational History   Occupation: Public house manager: [employer]  Tobacco Use   Smoking status: Former    Current packs/day: 0.00    Types: Cigarettes    Quit  date: 2002    Years since quitting: 23.3   Smokeless tobacco: Never  Vaping Use   Vaping status: Never Used  Substance and Sexual Activity   Alcohol use: Yes    Alcohol/week: 14.0 standard drinks of alcohol    Types: 14 Cans of beer per week    Comment: 1-2 beers daily now hes retired   Drug use: Yes    Types: Other-see comments    Comment: THC gummies   Sexual activity: Yes    Comment: vasectomy  Other Topics Concern   Not on file  Social History Narrative   Marital status: married x 26 years.      Children: 3 children (23, 21, 16); no grandchildren.      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      Tobacco: quit smoking 15 years ago.      Alcohol: rarely      Exercise:  3 times per week (weights, cardio).      Seatbelt: 100%      Guns: none   Social Drivers of Corporate investment banker Strain: Low Risk  (11/08/2023)   Overall  Financial Resource Strain (CARDIA)    Difficulty of Paying Living Expenses: Not hard at all  Food Insecurity: No Food Insecurity (11/08/2023)   Hunger Vital Sign    Worried About Running Out of Food in the Last Year: Never true    Ran Out of Food in the Last Year: Never true  Transportation Needs: No Transportation Needs (11/08/2023)   PRAPARE - Administrator, Civil Service (Medical): No    Lack of Transportation (Non-Medical): No  Physical Activity: Sufficiently Active (11/08/2023)   Exercise Vital Sign    Days of Exercise per Week: 5 days    Minutes of Exercise per Session: 60 min  Stress: No Stress Concern Present (11/08/2023)   [employer] of Occupational Health - Occupational Stress Questionnaire    Feeling of Stress : Not at all  Social Connections: Socially Integrated (11/08/2023)   Social Connection and Isolation Panel [NHANES]    Frequency of Communication with Friends and Family: More than three times a week    Frequency of Social Gatherings with Friends and Family: More than three times a week    Attends Religious Services: More than 4 times per year    Active Member of [employer] or Organizations: Yes    Attends Engineer, structural: More than 4 times per year    Marital Status: Married    Family History  Problem Relation Age of Onset   Diabetes Mother    Dementia Mother    Arthritis Brother    Colon cancer Neg Hx    Colon polyps Neg Hx    Esophageal cancer Neg Hx    Stomach cancer Neg Hx    Rectal cancer Neg Hx     Past Medical History:  Diagnosis Date   Allergy    Zyrtec  daily.  Flonase  PRN.   BPH (benign prostatic hyperplasia)    Dyslipidemia    Erectile dysfunction    GERD (gastroesophageal reflux disease)    Hyperlipidemia    Hypogonadism male    Obstructive sleep apnea    OSA on CPAP    Sleep apnea     Past Surgical History:  Procedure Laterality Date   CHOLECYSTECTOMY     COLONOSCOPY     deviated septum-cyst  EYE SURGERY      L eye tumor benign.   GALLBLADDER SURGERY     INGUINAL HERNIA REPAIR N/A 01/10/2020   Procedure: HERNIA REPAIR UMBILICAL, ADULT;  Surgeon: Florencio Hunting, MD;  Location: [facility];  Service: Urology;  Laterality: N/A;   LYMPHADENECTOMY Bilateral 01/10/2020   Procedure: LYMPHADENECTOMY, PELVIC;  Surgeon: Florencio Hunting, MD;  Location: [facility];  Service: Urology;  Laterality: Bilateral;   PILONIDAL CYST EXCISION     PROSTATE SURGERY N/A    Phreesia 08/26/2020   ROBOT ASSISTED LAPAROSCOPIC RADICAL PROSTATECTOMY N/A 01/10/2020   Procedure: XI ROBOTIC ASSISTED LAPAROSCOPIC RADICAL PROSTATECTOMY LEVEL 2;  Surgeon: Florencio Hunting, MD;  Location: [facility];  Service: Urology;  Laterality: N/A;   VASECTOMY       Current Outpatient Medications on File Prior to Visit  Medication Sig Dispense Refill   diphenhydrAMINE  (BENADRYL ) 25 mg capsule Take 25 mg by mouth at bedtime as needed.     thyroid  (ARMOUR THYROID ) 60 MG tablet Take 1 tablet (60 mg total) by mouth daily on a empty stomach 30 tablet 1   acetaminophen  (TYLENOL ) 500 MG tablet Take 500 mg by mouth every 6 (six) hours as needed for mild pain or headache.     aspirin EC 81 MG tablet Take 81 mg by mouth daily. Swallow whole.     cetirizine  (ZYRTEC ) 10 MG tablet Take 1 tablet (10 mg total) by mouth daily. (Patient taking differently: Take 10 mg by mouth at bedtime.) 30 tablet 1   CHELATED IRON PO Take 30 mg by mouth.     Ferrous Gluconate (IRON 27 PO) Take 30 mg by mouth.     fluticasone  (FLONASE ) 50 MCG/ACT nasal spray Place 2 sprays into both nostrils daily. 16 g 1   ipratropium (ATROVENT ) 0.06 % nasal spray Place 1-2 sprays into both nostrils 4 (four) times daily. As needed for nasal congestion 15 mL 5   montelukast  (SINGULAIR ) 10 MG tablet Take 1 tablet (10 mg total) by mouth daily. 90 tablet 3   montelukast  (SINGULAIR ) 10 MG tablet Take 1 tablet (10 mg total) by mouth daily. 90 tablet 3   Multiple Vitamin (MULTI-VITAMIN) tablet Take 1 tablet by  mouth daily.     rosuvastatin  (CRESTOR ) 10 MG tablet Take 1 tablet (10 mg total) by mouth daily. 90 tablet 3   No current facility-administered medications on file prior to visit.    Allergies  Allergen Reactions   Penicillins Rash    Teen Years     DIAGNOSTIC DATA (LABS, IMAGING, TESTING) - I reviewed patient records, labs, notes, testing and imaging myself where available.  Lab Results  Component Value Date   WBC 5.2 08/29/2020   HGB 14.8 08/29/2020   HCT 44.7 08/29/2020   MCV 91 08/29/2020   PLT 182 08/29/2020      Component Value Date/Time   NA 138 10/12/2023 0925   NA 141 08/29/2020 1159   K 5.1 10/12/2023 0925   CL 104 10/12/2023 0925   CO2 30 10/12/2023 0925   GLUCOSE 100 (H) 10/12/2023 0925   BUN 19 10/12/2023 0925   BUN 26 08/29/2020 1159   CREATININE 1.11 10/12/2023 0925   CREATININE 1.13 07/02/2016 1650   CALCIUM  9.7 10/12/2023 0925   PROT 7.2 10/12/2023 0925   PROT 6.8 03/01/2022 1022   ALBUMIN 4.6 10/12/2023 0925   ALBUMIN 4.4 03/01/2022 1022   AST 31 10/12/2023 0925   ALT 29 10/12/2023 0925   ALKPHOS 51 10/12/2023 0925  BILITOT 0.4 10/12/2023 0925   BILITOT 0.5 03/01/2022 1022   GFRNONAA 66 08/29/2020 1159   GFRNONAA 70 07/02/2016 1650   GFRAA 76 08/29/2020 1159   GFRAA 81 07/02/2016 1650   Lab Results  Component Value Date   CHOL 114 10/12/2023   HDL 37.00 (L) 10/12/2023   LDLCALC 63 10/12/2023   TRIG 70.0 10/12/2023   CHOLHDL 3 10/12/2023   Lab Results  Component Value Date   HGBA1C 5.6 10/12/2023   No results found for: "VITAMINB12" Lab Results  Component Value Date   TSH 2.540 01/08/2019    PHYSICAL EXAM:  Today's Vitals   12/01/23 1128  BP: 120/60  Pulse: 66  Weight: 242 lb 12.8 oz (110.1 kg)  Height: 6' (1.829 m)   Body mass index is 32.93 kg/m.   Wt Readings from Last 3 Encounters:  12/01/23 242 lb 12.8 oz (110.1 kg)  10/12/23 241 lb (109.3 kg)  06/01/23 237 lb (107.5 kg)     Ht Readings from Last 3  Encounters:  12/01/23 6' (1.829 m)  10/12/23 6' (1.829 m)  06/01/23 6' (1.829 m)      General: The patient is awake, alert and appears not in acute distress. The patient is well groomed. Head: Normocephalic, atraumatic. Neck is supple. Mallampati: 1-2  neck circumference:19.5 inches .  Nasal airflow patent , Prognathia is seen.  Cardiovascular:  Regular rate and rhythm , without  murmurs or carotid bruit, and without distended neck veins. Respiratory: Lungs are clear to auscultation. Skin:  Without evidence of edema, or rash Trunk: BMI is 34.18.  The patient's posture is erect    Neurologic exam : The patient is awake and alert, oriented to place and time.   Attention span & concentration ability appears normal.  Speech is fluent, without dysarthria, dysphonia or aphasia.  Mood and affect are appropriate. Cranial nerves: Pupils are equal and briskly reactive to light.  Funduscopic exam without evidence of pallor or edema.  Extraocular movements in vertical and horizontal planes intact and without nystagmus.  Visual fields by finger perimetry are intact. Hearing to finger rub intact.   Facial sensation intact to fine touch. Facial motor strength is symmetric and tongue and uvula move midline. Shoulder shrug was symmetrical.  Motor exam:  Normal tone, muscle bulk and symmetric strength in all extremities. Sensory:  Fine touch, pinprick and vibration were tested in all extremities. Proprioception tested in the upper extremities was normal. Coordination: Rapid alternating movements and finger-to-nose maneuver without evidence of ataxia, dysmetria or tremor. Gait and station: Patient walks without assistive device. Deep tendon reflexes: in the  upper and lower extremities are symmetric and intact.      ASSESSMENT AND PLAN 68 y.o. year old male  here with:    1) weight loss struggles, he has been on testosterone  supplement at [facility] and thyroid  medication.  OSA on CPAP with  excellent compliance, Prostate cancer survivor.  Short sleeper in his subjective opinion.   2) BMI 35 or just below.  Discussed intermittent fasting, he has done it already.   3)  Can [facility] or Dr Ester Helms ; I encouraged a metabolic test either at [facility] or at [facility].     I plan to follow up either personally or through our NP within 12 months.   I would like to thank Benjiman Bras, MD for allowing me to meet with and to take care of this pleasant patient.    After spending a total  time of  35  minutes face to face and additional time for physical and neurologic examination, review of laboratory studies,  personal review of imaging studies, reports and results of other testing and review of referral information / records as far as provided in visit,   Electronically signed by: Neomia Banner, MD 12/01/2023 11:52 AM  [facility] Board certified by The [employer] of Sleep Medicine and Diplomate of the [employer] of Sleep Medicine. Board certified In Neurology through the ABPN, Fellow of the [employer] of Neurology.

## 2023-12-01 NOTE — Patient Instructions (Signed)

## 2024-01-23 ENCOUNTER — Encounter: Payer: Self-pay | Admitting: Family Medicine

## 2024-01-25 ENCOUNTER — Encounter: Payer: Self-pay | Admitting: Family Medicine

## 2024-01-25 ENCOUNTER — Ambulatory Visit (INDEPENDENT_AMBULATORY_CARE_PROVIDER_SITE_OTHER): Admitting: Family Medicine

## 2024-01-25 ENCOUNTER — Other Ambulatory Visit (HOSPITAL_BASED_OUTPATIENT_CLINIC_OR_DEPARTMENT_OTHER): Payer: Self-pay

## 2024-01-25 VITALS — BP 126/74 | HR 69 | Temp 98.1°F | Resp 17 | Ht 72.0 in | Wt 235.5 lb

## 2024-01-25 DIAGNOSIS — M65321 Trigger finger, right index finger: Secondary | ICD-10-CM | POA: Diagnosis not present

## 2024-01-25 MED ORDER — MELOXICAM 7.5 MG PO TABS
7.5000 mg | ORAL_TABLET | Freq: Every day | ORAL | 0 refills | Status: DC
  Filled 2024-01-25: qty 30, 30d supply, fill #0

## 2024-01-25 NOTE — Patient Instructions (Addendum)
 See info below about trigger finger. Try to avoid pinching or grasping of that finger.  Buddy taping to the third finger is an option to lessen movement, or using the padded splint that I provided today over the first knuckle of the finger to keep it from bending is an option as well. Temporary finger splint with activity or during daytime and NSAIDs may help since we have caught this early, but I will refer you to hand specialist as well to decide on injection.  Take care.   Trigger Finger  Trigger finger, also called stenosing tenosynovitis, is a condition that causes a finger or thumb to get stuck in a bent position. Each finger has tough, cord-like tissue (tendon) that connects muscle to bone, and each tendon passes through a tunnel of tissue (tendon sheath). The tendon sheaths are held close to the bone by a pulley. There is a pulley called the A1 pulley that is involved in the triggering of a finger or thumb. To move your finger, your tendon needs to glide freely through the sheath. Trigger finger happens when the tendon or the sheath thickens, making it difficult to bend or straighten your finger as the thickened tendon gets stuck in the A1 pulley. Trigger finger can affect any of the fingers or the thumbs. Mild cases may clear up with rest and medicine. Severe cases require more treatment. What are the causes? Trigger finger or thumb is caused by a thickened finger tendon or tendon sheath. The cause of this thickening is not known. What increases the risk? The following factors may make you more likely to develop this condition: Doing the same movements many times (repetitive activity) that require a strong grip. Having certain health conditions. These include rheumatoid arthritis, gout, carpal tunnel syndrome, or diabetes. Being 1-80 years old. Being male. What are the signs or symptoms? Symptoms of this condition include: Pain when bending or straightening your finger. Tenderness,  swelling, or a lump in the palm of your hand just below the finger joint. Hearing a noise like a pop or a snap when you try to straighten your finger. Feeling a catch or locked feeling when you try to straighten your finger. Being unable to straighten your finger without help from your other hand. How is this diagnosed? This condition is diagnosed based on your symptoms and a physical exam. How is this treated? This condition may be treated by: Resting your finger and avoiding activities that make symptoms worse. Wearing a finger splint to keep your finger extended. Taking NSAIDs, such as ibuprofen, to relieve pain and swelling around the tendon. Doing gentle exercises to stretch the finger as told by your health care provider. Having medicine that reduces swelling and inflammation (steroids) injected into the tendon sheath. Injections may need to be repeated. Trigger finger release. This surgery is done to open the pulley. This may be done if other treatments do not work and you cannot straighten or bend your finger. You may need hand therapy after surgery. Follow these instructions at home: If you have a removable splint: Wear the splint as told by your provider. Remove it only as told by your provider. Check the skin around the splint every day. Tell your provider about any concerns. Loosen the splint if your fingers tingle, become numb, or turn cold and blue. Keep the splint clean and dry. If the splint is not waterproof: Do not let it get wet. Cover it with a watertight covering when you take a bath or  shower. Managing pain, stiffness, and swelling     If told, put ice on the painful area. If you have a removable splint, remove it as told by your health care provider. Put ice in a plastic bag. Place a towel between your skin and the bag or between your splint and the bag. Leave the ice on for 20 minutes, 2-3 times a day. If told, apply heat to the affected area as often as told  by your provider. Use the heat source that your provider recommends, such as a moist heat pack or a heating pad. Place a towel between your skin and the heat source. Leave the heat on for 20-30 minutes. If your skin turns bright red, remove the ice or heat right away to prevent skin damage. The risk of damage is higher if you cannot feel pain, heat, or cold. Activity Rest your finger as told by your provider. Avoid activities that make the pain worse. Return to your normal activities as told by your provider. Ask your provider what activities are safe for you. Do exercises as told by your provider. Ask your provider when it is safe to drive if you have a splint on your hand. General instructions Take over-the-counter and prescription medicines only as told by your provider. Keep all follow-up visits. These are needed to see how you are progressing. Contact a health care provider if: Your symptoms are not improving with home care. This information is not intended to replace advice given to you by your health care provider. Make sure you discuss any questions you have with your health care provider. Document Revised: 03/05/2022 Document Reviewed: 03/05/2022 Elsevier Patient Education  2024 [employer].

## 2024-01-25 NOTE — Progress Notes (Signed)
 Subjective:  Patient ID: George Garner, male    DOB: 06/09/1956  Age: 69 y.o. MRN: [id number]  CC:  Chief Complaint  Patient presents with   Hand Pain    Pt notes has had trigger finger in Lt hand now concerned about Rt hand with similar sxs.      HPI George Garner presents for   Possible R hand trigger finger: Started last week - index finger. Catching sensation, worse in am. No pain. Has to force open. Plays guitar, but does not use that finger.  Saw hand specialist (Dr. Murrell) in 2022 with same sx's in left finger. Improved with single injection.  Tx: none       History Patient Active Problem List   Diagnosis Date Noted   OSA on CPAP 12/01/2023   Class 1 obesity due to excess calories without serious comorbidity with body mass index (BMI) of 34.0 to 34.9 in adult 11/23/2022   Prostate cancer (HCC) 01/10/2020   Fatigue 05/24/2018   Somnolence, daytime 05/24/2018   Obstructive sleep apnea treated with continuous positive airway pressure (CPAP) 01/31/2018   Snoring 09/28/2017   UARS (upper airway resistance syndrome) 09/28/2017   Retrognathia 09/28/2017   Allergic rhinitis due to allergen 09/28/2017   Obesity (BMI 30.0-34.9) 09/28/2017   Other seasonal allergic rhinitis 05/24/2014   Dyslipidemia    HYPOGODADISM MALE    Past Medical History:  Diagnosis Date   Allergy    Zyrtec  daily.  Flonase  PRN.   BPH (benign prostatic hyperplasia)    Dyslipidemia    Erectile dysfunction    GERD (gastroesophageal reflux disease)    Hyperlipidemia    Hypogonadism male    Obstructive sleep apnea    OSA on CPAP    Sleep apnea    Past Surgical History:  Procedure Laterality Date   CHOLECYSTECTOMY     COLONOSCOPY     deviated septum-cyst     EYE SURGERY     L eye tumor benign.   GALLBLADDER SURGERY     INGUINAL HERNIA REPAIR N/A 01/10/2020   Procedure: HERNIA REPAIR UMBILICAL, ADULT;  Surgeon: Renda Glance, MD;  Location: [facility];  Service: Urology;  Laterality: N/A;    LYMPHADENECTOMY Bilateral 01/10/2020   Procedure: LYMPHADENECTOMY, PELVIC;  Surgeon: Renda Glance, MD;  Location: [facility];  Service: Urology;  Laterality: Bilateral;   PILONIDAL CYST EXCISION     PROSTATE SURGERY N/A    Phreesia 08/26/2020   ROBOT ASSISTED LAPAROSCOPIC RADICAL PROSTATECTOMY N/A 01/10/2020   Procedure: XI ROBOTIC ASSISTED LAPAROSCOPIC RADICAL PROSTATECTOMY LEVEL 2;  Surgeon: Renda Glance, MD;  Location: [facility];  Service: Urology;  Laterality: N/A;   VASECTOMY     Allergies  Allergen Reactions   Penicillins Rash    Teen Years   Prior to Admission medications   Medication Sig Start Date End Date Taking? Authorizing Provider  acetaminophen  (TYLENOL ) 500 MG tablet Take 500 mg by mouth every 6 (six) hours as needed for mild pain or headache.   Yes [provider]  aspirin EC 81 MG tablet Take 81 mg by mouth daily. Swallow whole.   Yes [provider]  cetirizine  (ZYRTEC ) 10 MG tablet Take 1 tablet (10 mg total) by mouth daily. Patient taking differently: Take 10 mg by mouth at bedtime. 12/10/16  Yes McVey, Almarie Folks, PA-C  CHELATED IRON PO Take 30 mg by mouth.   Yes [provider]  diphenhydrAMINE  (BENADRYL ) 25 mg capsule Take 25 mg by mouth at bedtime  as needed.   Yes [provider]  Ferrous Gluconate (IRON 27 PO) Take 30 mg by mouth.   Yes [provider]  fluticasone  (FLONASE ) 50 MCG/ACT nasal spray Place 2 sprays into both nostrils daily. 12/10/16  Yes McVey, Almarie Folks, PA-C  ipratropium (ATROVENT ) 0.06 % nasal spray Place 1-2 sprays into both nostrils 4 (four) times daily. As needed for nasal congestion 08/17/21  Yes Levora Reyes SAUNDERS, MD  montelukast  (SINGULAIR ) 10 MG tablet Take 1 tablet (10 mg total) by mouth daily. 08/04/23  Yes Levora Reyes SAUNDERS, MD  Multiple Vitamin (MULTI-VITAMIN) tablet Take 1 tablet by mouth daily. 01/13/16  Yes [provider]  rosuvastatin  (CRESTOR ) 10 MG tablet Take 1 tablet (10  mg total) by mouth daily. 08/09/23 08/03/24 Yes Levora Reyes SAUNDERS, MD  thyroid  (ARMOUR THYROID ) 60 MG tablet Take 1 tablet (60 mg total) by mouth daily on a empty stomach 11/21/23  Yes    Social History   Socioeconomic History   Marital status: Married    Spouse name: Not on file   Number of children: Not on file   Years of education: Not on file   Highest education level: Master's degree (e.g., MA, MS, MEng, MEd, MSW, MBA)  Occupational History   Occupation: Public house manager: [employer]  Tobacco Use   Smoking status: Former    Current packs/day: 0.00    Types: Cigarettes    Quit date: 2002    Years since quitting: 23.4   Smokeless tobacco: Never  Vaping Use   Vaping status: Never Used  Substance and Sexual Activity   Alcohol use: Yes    Alcohol/week: 14.0 standard drinks of alcohol    Types: 14 Cans of beer per week    Comment: 1-2 beers daily now hes retired   Drug use: Yes    Types: Other-see comments    Comment: THC gummies   Sexual activity: Yes    Comment: vasectomy  Other Topics Concern   Not on file  Social History Narrative   Marital status: married x 26 years.      Children: 3 children (23, 21, 16); no grandchildren.      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      Tobacco: quit smoking 15 years ago.      Alcohol: rarely      Exercise:  3 times per week (weights, cardio).      Seatbelt: 100%      Guns: none   Social Drivers of Corporate investment banker Strain: Low Risk  (11/08/2023)   Overall Financial Resource Strain (CARDIA)    Difficulty of Paying Living Expenses: Not hard at all  Food Insecurity: No Food Insecurity (11/08/2023)   Hunger Vital Sign    Worried About Running Out of Food in the Last Year: Never true    Ran Out of Food in the Last Year: Never true  Transportation Needs: No Transportation Needs (11/08/2023)   PRAPARE - Administrator, Civil Service (Medical):  No    Lack of Transportation (Non-Medical): No  Physical Activity: Sufficiently Active (11/08/2023)   Exercise Vital Sign    Days of Exercise per Week: 5 days    Minutes of Exercise per Session: 60 min  Stress: No Stress Concern Present (11/08/2023)   [employer] of Occupational Health - Occupational Stress Questionnaire    Feeling  of Stress : Not at all  Social Connections: Socially Integrated (11/08/2023)   Social Connection and Isolation Panel    Frequency of Communication with Friends and Family: More than three times a week    Frequency of Social Gatherings with Friends and Family: More than three times a week    Attends Religious Services: More than 4 times per year    Active Member of [employer] or Organizations: Yes    Attends Engineer, structural: More than 4 times per year    Marital Status: Married  Catering manager Violence: Not At Risk (11/08/2023)   Humiliation, Afraid, Rape, and Kick questionnaire    Fear of Current or Ex-Partner: No    Emotionally Abused: No    Physically Abused: No    Sexually Abused: No    Review of Systems   Objective:   Vitals:   01/25/24 1404  BP: 126/74  Pulse: 69  Resp: 17  Temp: 98.1 F (36.7 C)  TempSrc: Temporal  SpO2: 96%  Weight: 235 lb 8 oz (106.8 kg)  Height: 6' (1.829 m)     Physical Exam Vitals reviewed.  Constitutional:      General: He is not in acute distress.    Appearance: Normal appearance. He is well-developed.  HENT:     Head: Normocephalic and atraumatic.   Cardiovascular:     Rate and Rhythm: Normal rate.  Pulmonary:     Effort: Pulmonary effort is normal.   Musculoskeletal:     Comments: Right hand, index finger with triggering noted, he feels at PIP, but palpable nodule or area appreciated with flexion to extension at the volar MCP.   Neurological:     Mental Status: He is alert and oriented to person, place, and time.   Psychiatric:        Mood and Affect: Mood normal.     Neurovascular  intact distally of hand, no erythema or wounds appreciated, no soft tissue swelling.   Assessment & Plan:  Jacob Cicero is a 68 y.o. male . Trigger index finger of right hand - Plan: Ambulatory referral to Hand Surgery, meloxicam  (MOBIC ) 7.5 MG tablet Index finger, trigger finger.  Similar symptoms as contralateral hand.  Early symptoms.  Activity modification discussed, and intermittent/temporary use of finger brace recommended and meloxicam  for now.  Will refer to hand specialist, Ortho to decide on injection, handout given.  Meds ordered this encounter  Medications   meloxicam  (MOBIC ) 7.5 MG tablet    Sig: Take 1 tablet (7.5 mg total) by mouth daily.    Dispense:  30 tablet    Refill:  0   Patient Instructions  See info below about trigger finger. Try to avoid pinching or grasping of that finger.  Buddy taping to the third finger is an option to lessen movement, or using the padded splint that I provided today over the first knuckle of the finger to keep it from bending is an option as well. Temporary finger splint with activity or during daytime and NSAIDs may help since we have caught this early, but I will refer you to hand specialist as well to decide on injection.  Take care.   Trigger Finger  Trigger finger, also called stenosing tenosynovitis, is a condition that causes a finger or thumb to get stuck in a bent position. Each finger has tough, cord-like tissue (tendon) that connects muscle to bone, and each tendon passes through a tunnel of tissue (tendon sheath). The tendon sheaths are held  close to the bone by a pulley. There is a pulley called the A1 pulley that is involved in the triggering of a finger or thumb. To move your finger, your tendon needs to glide freely through the sheath. Trigger finger happens when the tendon or the sheath thickens, making it difficult to bend or straighten your finger as the thickened tendon gets stuck in the A1 pulley. Trigger finger can affect  any of the fingers or the thumbs. Mild cases may clear up with rest and medicine. Severe cases require more treatment. What are the causes? Trigger finger or thumb is caused by a thickened finger tendon or tendon sheath. The cause of this thickening is not known. What increases the risk? The following factors may make you more likely to develop this condition: Doing the same movements many times (repetitive activity) that require a strong grip. Having certain health conditions. These include rheumatoid arthritis, gout, carpal tunnel syndrome, or diabetes. Being 44-62 years old. Being male. What are the signs or symptoms? Symptoms of this condition include: Pain when bending or straightening your finger. Tenderness, swelling, or a lump in the palm of your hand just below the finger joint. Hearing a noise like a pop or a snap when you try to straighten your finger. Feeling a catch or locked feeling when you try to straighten your finger. Being unable to straighten your finger without help from your other hand. How is this diagnosed? This condition is diagnosed based on your symptoms and a physical exam. How is this treated? This condition may be treated by: Resting your finger and avoiding activities that make symptoms worse. Wearing a finger splint to keep your finger extended. Taking NSAIDs, such as ibuprofen, to relieve pain and swelling around the tendon. Doing gentle exercises to stretch the finger as told by your health care provider. Having medicine that reduces swelling and inflammation (steroids) injected into the tendon sheath. Injections may need to be repeated. Trigger finger release. This surgery is done to open the pulley. This may be done if other treatments do not work and you cannot straighten or bend your finger. You may need hand therapy after surgery. Follow these instructions at home: If you have a removable splint: Wear the splint as told by your provider. Remove it  only as told by your provider. Check the skin around the splint every day. Tell your provider about any concerns. Loosen the splint if your fingers tingle, become numb, or turn cold and blue. Keep the splint clean and dry. If the splint is not waterproof: Do not let it get wet. Cover it with a watertight covering when you take a bath or shower. Managing pain, stiffness, and swelling     If told, put ice on the painful area. If you have a removable splint, remove it as told by your health care provider. Put ice in a plastic bag. Place a towel between your skin and the bag or between your splint and the bag. Leave the ice on for 20 minutes, 2-3 times a day. If told, apply heat to the affected area as often as told by your provider. Use the heat source that your provider recommends, such as a moist heat pack or a heating pad. Place a towel between your skin and the heat source. Leave the heat on for 20-30 minutes. If your skin turns bright red, remove the ice or heat right away to prevent skin damage. The risk of damage is higher if you cannot feel  pain, heat, or cold. Activity Rest your finger as told by your provider. Avoid activities that make the pain worse. Return to your normal activities as told by your provider. Ask your provider what activities are safe for you. Do exercises as told by your provider. Ask your provider when it is safe to drive if you have a splint on your hand. General instructions Take over-the-counter and prescription medicines only as told by your provider. Keep all follow-up visits. These are needed to see how you are progressing. Contact a health care provider if: Your symptoms are not improving with home care. This information is not intended to replace advice given to you by your health care provider. Make sure you discuss any questions you have with your health care provider. Document Revised: 03/05/2022 Document Reviewed: 03/05/2022 Elsevier Patient  Education  2024 Elsevier Inc.       Signed,   Reyes Pines, MD [facility], [facility] 01/25/24 2:51 PM

## 2024-01-31 ENCOUNTER — Ambulatory Visit (HOSPITAL_BASED_OUTPATIENT_CLINIC_OR_DEPARTMENT_OTHER): Admitting: Student

## 2024-01-31 DIAGNOSIS — M65321 Trigger finger, right index finger: Secondary | ICD-10-CM | POA: Diagnosis not present

## 2024-01-31 MED ORDER — TRIAMCINOLONE ACETONIDE 40 MG/ML IJ SUSP
1.0000 mL | INTRAMUSCULAR | Status: AC | PRN
  Administered 2024-01-31: 1 mL

## 2024-01-31 MED ORDER — LIDOCAINE HCL 1 % IJ SOLN
1.0000 mL | INTRAMUSCULAR | Status: AC | PRN
  Administered 2024-01-31: 1 mL

## 2024-01-31 NOTE — Progress Notes (Signed)
 Chief Complaint: Right trigger finger     History of Present Illness:    Carlen Fils is a 68 y.o. right-hand-dominant male who presents today for evaluation of triggering of the right index finger.  He reports that this began about a week ago and initially presented as soreness however has been triggering fairly consistently.  This has been worse first thing in the morning.  He has experienced this previously in the left hand and symptoms improved with a single cortisone injection.  He has been taking meloxicam  7.5 mg prescribed by his PCP.  He enjoys activities such as exercise and playing guitar.   Surgical History:   None  PMH/PSH/Family History/Social History/Meds/Allergies:    Past Medical History:  Diagnosis Date   Allergy    Zyrtec  daily.  Flonase  PRN.   BPH (benign prostatic hyperplasia)    Dyslipidemia    Erectile dysfunction    GERD (gastroesophageal reflux disease)    Hyperlipidemia    Hypogonadism male    Obstructive sleep apnea    OSA on CPAP    Sleep apnea    Past Surgical History:  Procedure Laterality Date   CHOLECYSTECTOMY     COLONOSCOPY     deviated septum-cyst     EYE SURGERY     L eye tumor benign.   GALLBLADDER SURGERY     INGUINAL HERNIA REPAIR N/A 01/10/2020   Procedure: HERNIA REPAIR UMBILICAL, ADULT;  Surgeon: Renda Glance, MD;  Location: [facility];  Service: Urology;  Laterality: N/A;   LYMPHADENECTOMY Bilateral 01/10/2020   Procedure: LYMPHADENECTOMY, PELVIC;  Surgeon: Renda Glance, MD;  Location: [facility];  Service: Urology;  Laterality: Bilateral;   PILONIDAL CYST EXCISION     PROSTATE SURGERY N/A    Phreesia 08/26/2020   ROBOT ASSISTED LAPAROSCOPIC RADICAL PROSTATECTOMY N/A 01/10/2020   Procedure: XI ROBOTIC ASSISTED LAPAROSCOPIC RADICAL PROSTATECTOMY LEVEL 2;  Surgeon: Renda Glance, MD;  Location: [facility];  Service: Urology;  Laterality: N/A;   VASECTOMY     Social History   Socioeconomic History    Marital status: Married    Spouse name: Not on file   Number of children: Not on file   Years of education: Not on file   Highest education level: Master's degree (e.g., MA, MS, MEng, MEd, MSW, MBA)  Occupational History   Occupation: Public house manager: [employer]  Tobacco Use   Smoking status: Former    Current packs/day: 0.00    Types: Cigarettes    Quit date: 2002    Years since quitting: 23.5   Smokeless tobacco: Never  Vaping Use   Vaping status: Never Used  Substance and Sexual Activity   Alcohol use: Yes    Alcohol/week: 14.0 standard drinks of alcohol    Types: 14 Cans of beer per week    Comment: 1-2 beers daily now hes retired   Drug use: Yes    Types: Other-see comments    Comment: THC gummies   Sexual activity: Yes    Comment: vasectomy  Other Topics Concern   Not on file  Social History Narrative   Marital status: married x 26 years.      Children: 3 children (23, 21, 16); no grandchildren.      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      Tobacco: quit smoking 15 years ago.      Alcohol: rarely      Exercise:  3 times per week (weights, cardio).      Seatbelt: 100%      Guns: none   Social Drivers of Corporate investment banker Strain: Low Risk  (11/08/2023)   Overall Financial Resource Strain (CARDIA)    Difficulty of Paying Living Expenses: Not hard at all  Food Insecurity: No Food Insecurity (11/08/2023)   Hunger Vital Sign    Worried About Running Out of Food in the Last Year: Never true    Ran Out of Food in the Last Year: Never true  Transportation Needs: No Transportation Needs (11/08/2023)   PRAPARE - Administrator, Civil Service (Medical): No    Lack of Transportation (Non-Medical): No  Physical Activity: Sufficiently Active (11/08/2023)   Exercise Vital Sign    Days of Exercise per Week: 5 days    Minutes of Exercise per Session: 60 min  Stress: No Stress  Concern Present (11/08/2023)   [employer] of Occupational Health - Occupational Stress Questionnaire    Feeling of Stress : Not at all  Social Connections: Socially Integrated (11/08/2023)   Social Connection and Isolation Panel    Frequency of Communication with Friends and Family: More than three times a week    Frequency of Social Gatherings with Friends and Family: More than three times a week    Attends Religious Services: More than 4 times per year    Active Member of [employer] or Organizations: Yes    Attends Engineer, structural: More than 4 times per year    Marital Status: Married   Family History  Problem Relation Age of Onset   Diabetes Mother    Dementia Mother    Arthritis Brother    Colon cancer Neg Hx    Colon polyps Neg Hx    Esophageal cancer Neg Hx    Stomach cancer Neg Hx    Rectal cancer Neg Hx    Allergies  Allergen Reactions   Penicillins Rash    Teen Years   Current Outpatient Medications  Medication Sig Dispense Refill   acetaminophen  (TYLENOL ) 500 MG tablet Take 500 mg by mouth every 6 (six) hours as needed for mild pain or headache.     aspirin EC 81 MG tablet Take 81 mg by mouth daily. Swallow whole.     cetirizine  (ZYRTEC ) 10 MG tablet Take 1 tablet (10 mg total) by mouth daily. (Patient taking differently: Take 10 mg by mouth at bedtime.) 30 tablet 1   CHELATED IRON PO Take 30 mg by mouth.     diphenhydrAMINE  (BENADRYL ) 25 mg capsule Take 25 mg by mouth at bedtime as needed.     Ferrous Gluconate (IRON 27 PO) Take 30 mg by mouth.     fluticasone  (FLONASE ) 50 MCG/ACT nasal spray Place 2 sprays into both nostrils daily. 16 g 1   ipratropium (ATROVENT ) 0.06 % nasal spray Place 1-2 sprays into both nostrils 4 (four) times daily. As needed for nasal congestion 15 mL 5   meloxicam  (MOBIC ) 7.5 MG tablet Take 1 tablet (7.5 mg total) by mouth daily. 30 tablet 0   montelukast  (SINGULAIR ) 10 MG tablet Take 1 tablet (10 mg total) by mouth daily. 90  tablet 3   Multiple Vitamin (MULTI-VITAMIN) tablet Take 1 tablet by mouth daily.  rosuvastatin  (CRESTOR ) 10 MG tablet Take 1 tablet (10 mg total) by mouth daily. 90 tablet 3   thyroid  (ARMOUR THYROID ) 60 MG tablet Take 1 tablet (60 mg total) by mouth daily on a empty stomach 30 tablet 1   No current facility-administered medications for this visit.   No results found.  Review of Systems:   A ROS was performed including pertinent positives and negatives as documented in the HPI.  Physical Exam :   Constitutional: NAD and appears stated age Neurological: Alert and oriented Psych: Appropriate affect and cooperative There were no vitals taken for this visit.   Comprehensive Musculoskeletal Exam:    Exam of the right hand demonstrates catching of the right index finger when moving from flexion to extension.  Palpable nodule at the A1 pulley although this is mildly tender.  No overlying erythema or warmth.  Distal perfusion and neurosensory exam is intact.  Imaging:     Assessment:   68 y.o. male with 1 week history of triggering of the right index finger.  He has a previous history of trigger finger on the contralateral side which was treated with a cortisone injection and has been asymptomatic since that time.  Given this I do believe he would benefit well from an ultrasound-guided injection which he is agreeable to.  Injection was performed of the right index A1 pulley with visualization of injectate into the tendon sheath and he tolerated the procedure well.  Will plan to have him return to clinic as needed.  Plan :    - Trigger finger cortisone injection performed today - Return to clinic as needed     Procedure Note  Patient: George Garner             Date of Birth: 15-Jan-1956           MRN: [id number]             Visit Date: 01/31/2024  Procedures: Visit Diagnoses:  1. Trigger finger, right index finger     Hand/UE Inj: R index A1 for trigger finger on 01/31/2024 11:29  AM Indications: pain and therapeutic Details: 25 G needle, volar approach Medications: 1 mL lidocaine  1 %; 1 mL triamcinolone  acetonide 40 MG/ML Outcome: tolerated well, no immediate complications Procedure, treatment alternatives, risks and benefits explained, specific risks discussed. Consent was given by the patient. Immediately prior to procedure a time out was called to verify the correct patient, procedure, equipment, support staff and site/side marked as required. Patient was prepped and draped in the usual sterile fashion.      I personally saw and evaluated the patient, and participated in the management and treatment plan.  Leonce Reveal, PA-C Orthopedics

## 2024-02-02 ENCOUNTER — Other Ambulatory Visit: Payer: Self-pay

## 2024-02-02 ENCOUNTER — Other Ambulatory Visit (HOSPITAL_BASED_OUTPATIENT_CLINIC_OR_DEPARTMENT_OTHER): Payer: Self-pay

## 2024-02-02 ENCOUNTER — Other Ambulatory Visit: Payer: Self-pay | Admitting: Family Medicine

## 2024-02-06 ENCOUNTER — Other Ambulatory Visit (HOSPITAL_BASED_OUTPATIENT_CLINIC_OR_DEPARTMENT_OTHER): Payer: Self-pay

## 2024-02-06 ENCOUNTER — Other Ambulatory Visit: Payer: Self-pay | Admitting: Family Medicine

## 2024-02-06 MED ORDER — MONTELUKAST SODIUM 10 MG PO TABS
10.0000 mg | ORAL_TABLET | Freq: Every day | ORAL | 3 refills | Status: AC
  Filled 2024-02-06: qty 90, 90d supply, fill #0
  Filled 2024-05-01: qty 90, 90d supply, fill #1
  Filled 2024-07-29: qty 90, 90d supply, fill #2

## 2024-04-07 ENCOUNTER — Encounter: Payer: Self-pay | Admitting: Family Medicine

## 2024-04-16 ENCOUNTER — Encounter: Payer: Self-pay | Admitting: Family Medicine

## 2024-04-16 ENCOUNTER — Ambulatory Visit (INDEPENDENT_AMBULATORY_CARE_PROVIDER_SITE_OTHER): Admitting: Family Medicine

## 2024-04-16 VITALS — BP 126/62 | HR 65 | Temp 97.6°F | Resp 14 | Ht 72.0 in | Wt 242.0 lb

## 2024-04-16 DIAGNOSIS — R7303 Prediabetes: Secondary | ICD-10-CM

## 2024-04-16 DIAGNOSIS — G4700 Insomnia, unspecified: Secondary | ICD-10-CM

## 2024-04-16 DIAGNOSIS — G4733 Obstructive sleep apnea (adult) (pediatric): Secondary | ICD-10-CM

## 2024-04-16 DIAGNOSIS — Z23 Encounter for immunization: Secondary | ICD-10-CM

## 2024-04-16 DIAGNOSIS — R0981 Nasal congestion: Secondary | ICD-10-CM | POA: Diagnosis not present

## 2024-04-16 DIAGNOSIS — Z0000 Encounter for general adult medical examination without abnormal findings: Secondary | ICD-10-CM | POA: Diagnosis not present

## 2024-04-16 DIAGNOSIS — E785 Hyperlipidemia, unspecified: Secondary | ICD-10-CM | POA: Diagnosis not present

## 2024-04-16 LAB — COMPREHENSIVE METABOLIC PANEL WITH GFR
ALT: 25 U/L (ref 0–53)
AST: 21 U/L (ref 0–37)
Albumin: 4.3 g/dL (ref 3.5–5.2)
Alkaline Phosphatase: 49 U/L (ref 39–117)
BUN: 22 mg/dL (ref 6–23)
CO2: 29 meq/L (ref 19–32)
Calcium: 9.4 mg/dL (ref 8.4–10.5)
Chloride: 105 meq/L (ref 96–112)
Creatinine, Ser: 1.09 mg/dL (ref 0.40–1.50)
GFR: 69.44 mL/min (ref >60.00)
Glucose, Bld: 97 mg/dL (ref 70–99)
Potassium: 4.2 meq/L (ref 3.5–5.1)
Sodium: 140 meq/L (ref 135–145)
Total Bilirubin: 0.5 mg/dL (ref 0.2–1.2)
Total Protein: 6.8 g/dL (ref 6.0–8.3)

## 2024-04-16 LAB — HEMOGLOBIN A1C: Hgb A1c MFr Bld: 5.9 % (ref 4.6–6.5)

## 2024-04-16 LAB — LIPID PANEL
Cholesterol: 136 mg/dL (ref 0–200)
HDL: 42 mg/dL (ref >39.00)
LDL Cholesterol: 67 mg/dL (ref 0–99)
NonHDL: 94.49
Total CHOL/HDL Ratio: 3
Triglycerides: 138 mg/dL (ref 0.0–149.0)
VLDL: 27.6 mg/dL (ref 0.0–40.0)

## 2024-04-16 NOTE — Patient Instructions (Signed)
 Thanks for coming in today.  Sorry to hear about the difficulty with early awakening.  If you want to try some over-the-counter options and alternate those I think that would be reasonable to see if that is effective but I will also place a referral to meet with a different sleep specialist for second opinion.  CPAP use looks great, no changes there.  If any concerns on labs I will let you know.  No change in meds today.  Take care!  Preventive Care 45 Years and Older, Male Preventive care refers to lifestyle choices and visits with your health care provider that can promote health and wellness. Preventive care visits are also called wellness exams. What can I expect for my preventive care visit? Counseling During your preventive care visit, your health care provider may ask about your: Medical history, including: Past medical problems. Family medical history. History of falls. Current health, including: Emotional well-being. Home life and relationship well-being. Sexual activity. Memory and ability to understand (cognition). Lifestyle, including: Alcohol, nicotine or tobacco, and drug use. Access to firearms. Diet, exercise, and sleep habits. Work and work Astronomer. Sunscreen use. Safety issues such as seatbelt and bike helmet use. Physical exam Your health care provider will check your: Height and weight. These may be used to calculate your BMI (body mass index). BMI is a measurement that tells if you are at a healthy weight. Waist circumference. This measures the distance around your waistline. This measurement also tells if you are at a healthy weight and may help predict your risk of certain diseases, such as type 2 diabetes and high blood pressure. Heart rate and blood pressure. Body temperature. Skin for abnormal spots. What immunizations do I need?  Vaccines are usually given at various ages, according to a schedule. Your health care provider will recommend vaccines for you  based on your age, medical history, and lifestyle or other factors, such as travel or where you work. What tests do I need? Screening Your health care provider may recommend screening tests for certain conditions. This may include: Lipid and cholesterol levels. Diabetes screening. This is done by checking your blood sugar (glucose) after you have not eaten for a while (fasting). Hepatitis C test. Hepatitis B test. HIV (human immunodeficiency virus) test. STI (sexually transmitted infection) testing, if you are at risk. Lung cancer screening. Colorectal cancer screening. Prostate cancer screening. Abdominal aortic aneurysm (AAA) screening. You may need this if you are a current or former smoker. Talk with your health care provider about your test results, treatment options, and if necessary, the need for more tests. Follow these instructions at home: Eating and drinking  Eat a diet that includes fresh fruits and vegetables, whole grains, lean protein, and low-fat dairy products. Limit your intake of foods with high amounts of sugar, saturated fats, and salt. Take vitamin and mineral supplements as recommended by your health care provider. Do not drink alcohol if your health care provider tells you not to drink. If you drink alcohol: Limit how much you have to 0-2 drinks a day. Know how much alcohol is in your drink. In the [place] one drink equals one 12 oz bottle of beer (355 mL), one 5 oz glass of wine (148 mL), or one 1 oz glass of hard liquor (44 mL). Lifestyle Brush your teeth every morning and night with fluoride toothpaste. Floss one time each day. Exercise for at least 30 minutes 5 or more days each week. Do not use any products that contain  nicotine or tobacco. These products include cigarettes, chewing tobacco, and vaping devices, such as e-cigarettes. If you need help quitting, ask your health care provider. Do not use drugs. If you are sexually active, practice safe sex. Use a  condom or other form of protection to prevent STIs. Take aspirin only as told by your health care provider. Make sure that you understand how much to take and what form to take. Work with your health care provider to find out whether it is safe and beneficial for you to take aspirin daily. Ask your health care provider if you need to take a cholesterol-lowering medicine (statin). Find healthy ways to manage stress, such as: Meditation, yoga, or listening to music. Journaling. Talking to a trusted person. Spending time with friends and family. Safety Always wear your seat belt while driving or riding in a vehicle. Do not drive: If you have been drinking alcohol. Do not ride with someone who has been drinking. When you are tired or distracted. While texting. If you have been using any mind-altering substances or drugs. Wear a helmet and other protective equipment during sports activities. If you have firearms in your house, make sure you follow all gun safety procedures. Minimize exposure to UV radiation to reduce your risk of skin cancer. What's next? Visit your health care provider once a year for an annual wellness visit. Ask your health care provider how often you should have your eyes and teeth checked. Stay up to date on all vaccines. This information is not intended to replace advice given to you by your health care provider. Make sure you discuss any questions you have with your health care provider. Document Revised: 01/14/2021 Document Reviewed: 01/14/2021 [employer] Patient Education  2024 [employer].

## 2024-04-16 NOTE — Progress Notes (Signed)
 Subjective:  Patient ID: George Garner, male    DOB: 1956/03/25  Age: 68 y.o. MRN: [id number]  CC:  Chief Complaint  Patient presents with   Annual Exam    Not getting enough sleep. Go to sleeps great, when he wakes up he is up for the day, cannot go back to sleep. Taking OTC sleep aide that has helped some. Cannot recall the name.     HPI George Garner presents for Annual Exam an concern above.  Doing well.   Medicare wellness exam 11/08/23. PCP, me Sleep specialist, Dr. Chalice, OSA on CPAP, visit in May.  Trouble with early wakening, difficulty falling back asleep.  Short sleeper per note from sleep specialist in May.  Compliance report was excellent.  Has taken over-the-counter sleep aid with some relief. Sleeping 7 hours. Waking up groggy with med sleep aid. Tried THC gummies in past - some benefit. No joint pains. Urinates when he wakes up. Has tried to decrease fluids prior to bedtime, no relief with flomax . Apple watch indicates only 3-5% deep sleep. Would like to get 2nd opinion on sleep.  Hand/Ortho, previously seen by Dr. Kuzma for trigger finger, then PA Leonce Dalton at [facility] in July - injection effective.  Urology Dr. Renda, history of prostate cancer with prostatectomy in 2021. Recent visit last week for other issue, levels ok at July visit. 6 month follow ups for now?  ENT, Dr. Karis - wears hearing aids.   History of allergic rhinitis treated with Singulair , Zyrtec , Flonase .  Prediabetes: History of prediabetes, borderline with obesity previously, will improved with weight loss.  Diet had improved and discussed in March.  A1c previously 5.7, repeat in March lower to 5.6.  Weight has overall remained stable from 241-242 today. Low 230's on home readings - has been walking or running 5 days per week. Watching diet. Limited fast food, no sugar drinks.  Lab Results  Component Value Date   HGBA1C 5.6 10/12/2023   Wt Readings from Last 3 Encounters:   04/16/24 242 lb (109.8 kg)  01/25/24 235 lb 8 oz (106.8 kg)  12/01/23 242 lb 12.8 oz (110.1 kg)   Hyperlipidemia: Crestor  10 mg daily. No new myalgia or side effects.  Lab Results  Component Value Date   CHOL 114 10/12/2023   HDL 37.00 (L) 10/12/2023   LDLCALC 63 10/12/2023   TRIG 70.0 10/12/2023   CHOLHDL 3 10/12/2023   Lab Results  Component Value Date   ALT 29 10/12/2023   AST 31 10/12/2023   ALKPHOS 51 10/12/2023   BILITOT 0.4 10/12/2023         04/16/2024    8:03 AM 11/08/2023    8:19 AM 10/12/2023    8:46 AM 04/13/2023    8:13 AM 10/27/2022    8:06 AM  Depression screen PHQ 2/9  Decreased Interest 0 0 0 0 0  Down, Depressed, Hopeless 0 0 0 0 0  PHQ - 2 Score 0 0 0 0 0  Altered sleeping 1 3 0 0 1  Tired, decreased energy 1 3 0 0 1  Change in appetite 2 0 0 0 0  Feeling bad or failure about yourself  0 0 0 0 0  Trouble concentrating 0 0 0 0 0  Moving slowly or fidgety/restless 0 0 0 0 0  Suicidal thoughts 0 0 0 0 0  PHQ-9 Score 4 6 0 0 2  Difficult doing work/chores Not difficult at all  Sleep issues only.   Health Maintenance  Topic Date Due   Influenza Vaccine  03/02/2024   COVID-19 Vaccine (6 - 2025-26 season) 04/02/2024   Medicare Annual Wellness (AWV)  11/07/2024   Colonoscopy  04/14/2028   DTaP/Tdap/Td (4 - Td or Tdap) 01/23/2032   Pneumococcal Vaccine: 50+ Years  Completed   Hepatitis C Screening  Completed   Zoster Vaccines- Shingrix   Completed   HPV VACCINES  Aged Out   Meningococcal B Vaccine  Aged Out  Colonoscopy 04/14/2018 with a 10-year follow-up. Prostate: History of prostate cancer followed by urology as above   Immunization History  Administered Date(s) Administered   Fluad Quad(high Dose 65+) 04/10/2021, 05/27/2022   Fluad Trivalent(High Dose 65+) 04/13/2023   Influenza Inj Mdck Quad Pf 08/12/2017   Influenza Whole 04/29/2011   Influenza,inj,Quad PF,6+ Mos 05/13/2014, 06/23/2015, 07/02/2016, 08/12/2017, 05/26/2018, 04/04/2019    Influenza-Unspecified 05/01/2021   PFIZER Comirnaty(Gray Top)Covid-19 Tri-Sucrose Vaccine 09/21/2019, 10/15/2019, 05/09/2020, 02/13/2021   Pneumococcal Conjugate-13 08/29/2020   Pneumococcal Polysaccharide-23 10/01/2021   Tdap 06/08/2006, 07/02/2016, 01/22/2022   Unspecified SARS-COV-2 Vaccination 05/27/2022   Zoster Recombinant(Shingrix ) 02/13/2021, 06/05/2021   Zoster, Live 08/03/2011  Covid booster planned at pharmacy. Flu vaccine today.   No results found. Wears glasses - appt in past year.   Dental: every 6 mo  Alcohol: rare - 2 per week.   Tobacco: none.   Exercise: walking, running 5 days per week, 3 days weights per week.    History Patient Active Problem List   Diagnosis Date Noted   OSA on CPAP 12/01/2023   Class 1 obesity due to excess calories without serious comorbidity with body mass index (BMI) of 34.0 to 34.9 in adult 11/23/2022   Prostate cancer (HCC) 01/10/2020   Fatigue 05/24/2018   Somnolence, daytime 05/24/2018   Obstructive sleep apnea treated with continuous positive airway pressure (CPAP) 01/31/2018   Snoring 09/28/2017   UARS (upper airway resistance syndrome) 09/28/2017   Retrognathia 09/28/2017   Allergic rhinitis due to allergen 09/28/2017   Obesity (BMI 30.0-34.9) 09/28/2017   Other seasonal allergic rhinitis 05/24/2014   Dyslipidemia    HYPOGODADISM MALE    Past Medical History:  Diagnosis Date   Allergy    Zyrtec  daily.  Flonase  PRN.   Arthritis minor in left knee   BPH (benign prostatic hyperplasia)    Dyslipidemia    Erectile dysfunction    GERD (gastroesophageal reflux disease)    Hyperlipidemia    Hypogonadism male    Obstructive sleep apnea    OSA on CPAP    Sleep apnea    Past Surgical History:  Procedure Laterality Date   CHOLECYSTECTOMY     COLONOSCOPY     deviated septum-cyst     EYE SURGERY     L eye tumor benign.   GALLBLADDER SURGERY     INGUINAL HERNIA REPAIR N/A 01/10/2020   Procedure: HERNIA REPAIR UMBILICAL,  ADULT;  Surgeon: Renda Glance, MD;  Location: [facility];  Service: Urology;  Laterality: N/A;   LYMPHADENECTOMY Bilateral 01/10/2020   Procedure: LYMPHADENECTOMY, PELVIC;  Surgeon: Renda Glance, MD;  Location: [facility];  Service: Urology;  Laterality: Bilateral;   PILONIDAL CYST EXCISION     PROSTATE SURGERY N/A    Phreesia 08/26/2020   ROBOT ASSISTED LAPAROSCOPIC RADICAL PROSTATECTOMY N/A 01/10/2020   Procedure: XI ROBOTIC ASSISTED LAPAROSCOPIC RADICAL PROSTATECTOMY LEVEL 2;  Surgeon: Renda Glance, MD;  Location: [facility];  Service: Urology;  Laterality: N/A;   VASECTOMY     Allergies  Allergen Reactions   Penicillins Rash    Teen Years   Prior to Admission medications   Medication Sig Start Date End Date Taking? Authorizing Provider  acetaminophen  (TYLENOL ) 500 MG tablet Take 500 mg by mouth every 6 (six) hours as needed for mild pain or headache.   Yes [provider]  aspirin EC 81 MG tablet Take 81 mg by mouth daily. Swallow whole.   Yes [provider]  diphenhydrAMINE  (BENADRYL ) 25 mg capsule Take 25 mg by mouth at bedtime as needed.   Yes [provider]  fluticasone  (FLONASE ) 50 MCG/ACT nasal spray Place 2 sprays into both nostrils daily. 12/10/16  Yes McVey, Almarie Folks, PA-C  ipratropium (ATROVENT ) 0.06 % nasal spray Place 1-2 sprays into both nostrils 4 (four) times daily. As needed for nasal congestion 08/17/21  Yes Levora Reyes SAUNDERS, MD  montelukast  (SINGULAIR ) 10 MG tablet Take 1 tablet (10 mg total) by mouth daily. 02/06/24  Yes Levora Reyes SAUNDERS, MD  Multiple Vitamin (MULTI-VITAMIN) tablet Take 1 tablet by mouth daily. 01/13/16  Yes [provider]  rosuvastatin  (CRESTOR ) 10 MG tablet Take 1 tablet (10 mg total) by mouth daily. 08/09/23 08/03/24 Yes Levora Reyes SAUNDERS, MD  cetirizine  (ZYRTEC ) 10 MG tablet Take 1 tablet (10 mg total) by mouth daily. Patient taking differently: Take 10 mg by mouth at bedtime. 12/10/16   McVey, Almarie Folks,  PA-C  meloxicam  (MOBIC ) 7.5 MG tablet Take 1 tablet (7.5 mg total) by mouth daily. 01/25/24   Levora Reyes SAUNDERS, MD  montelukast  (SINGULAIR ) 10 MG tablet Take 1 tablet (10 mg total) by mouth daily. Patient not taking: Reported on 04/16/2024 08/04/23   Levora Reyes SAUNDERS, MD   Social History   Socioeconomic History   Marital status: Married    Spouse name: Not on file   Number of children: Not on file   Years of education: Not on file   Highest education level: Master's degree (e.g., MA, MS, MEng, MEd, MSW, MBA)  Occupational History   Occupation: Public house manager: [employer]  Tobacco Use   Smoking status: Former    Current packs/day: 0.00    Types: Cigarettes    Quit date: 2002    Years since quitting: 23.7   Smokeless tobacco: Never  Vaping Use   Vaping status: Never Used  Substance and Sexual Activity   Alcohol use: Yes    Alcohol/week: 14.0 standard drinks of alcohol    Types: 14 Cans of beer per week    Comment: 1-2 beers daily now hes retired   Drug use: Yes    Types: Other-see comments    Comment: THC gummies   Sexual activity: Yes    Comment: vasectomy  Other Topics Concern   Not on file  Social History Narrative   Marital status: married x 26 years.      Children: 3 children (23, 21, 16); no grandchildren.      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      Tobacco: quit smoking 15 years ago.      Alcohol: rarely      Exercise:  3 times per week (weights, cardio).      Seatbelt: 100%      Guns: none   Social Drivers of Corporate investment banker Strain: Low Risk  (04/12/2024)   Overall Financial Resource Strain (CARDIA)    Difficulty  of Paying Living Expenses: Not hard at all  Food Insecurity: No Food Insecurity (04/12/2024)   Hunger Vital Sign    Worried About Running Out of Food in the Last Year: Never true    Ran Out of Food in the Last Year: Never true  Transportation Needs: No  Transportation Needs (04/12/2024)   PRAPARE - Administrator, Civil Service (Medical): No    Lack of Transportation (Non-Medical): No  Physical Activity: Sufficiently Active (04/12/2024)   Exercise Vital Sign    Days of Exercise per Week: 5 days    Minutes of Exercise per Session: 50 min  Stress: No Stress Concern Present (04/12/2024)   [employer] of Occupational Health - Occupational Stress Questionnaire    Feeling of Stress: Only a little  Social Connections: Socially Integrated (04/12/2024)   Social Connection and Isolation Panel    Frequency of Communication with Friends and Family: Once a week    Frequency of Social Gatherings with Friends and Family: Twice a week    Attends Religious Services: More than 4 times per year    Active Member of [employer] or Organizations: Yes    Attends Banker Meetings: More than 4 times per year    Marital Status: Married  Catering manager Violence: Not At Risk (11/08/2023)   Humiliation, Afraid, Rape, and Kick questionnaire    Fear of Current or Ex-Partner: No    Emotionally Abused: No    Physically Abused: No    Sexually Abused: No    Review of Systems 13 point review of systems per patient health survey noted.  Negative other than as indicated above or in HPI.    Objective:   Vitals:   04/16/24 0800  BP: 126/62  Pulse: 65  Resp: 14  Temp: 97.6 F (36.4 C)  TempSrc: Temporal  SpO2: 97%  Weight: 242 lb (109.8 kg)  Height: 6' (1.829 m)     Physical Exam Vitals reviewed.  Constitutional:      Appearance: He is well-developed.  HENT:     Head: Normocephalic and atraumatic.     Right Ear: External ear normal.     Left Ear: External ear normal.  Eyes:     Conjunctiva/sclera: Conjunctivae normal.     Pupils: Pupils are equal, round, and reactive to light.  Neck:     Thyroid : No thyromegaly.  Cardiovascular:     Rate and Rhythm: Normal rate and regular rhythm.     Heart sounds: Normal heart sounds.   Pulmonary:     Effort: Pulmonary effort is normal. No respiratory distress.     Breath sounds: Normal breath sounds. No wheezing.  Abdominal:     General: There is no distension.     Palpations: Abdomen is soft.     Tenderness: There is no abdominal tenderness.  Musculoskeletal:        General: No tenderness. Normal range of motion.     Cervical back: Normal range of motion and neck supple.  Lymphadenopathy:     Cervical: No cervical adenopathy.  Skin:    General: Skin is warm and dry.  Neurological:     Mental Status: He is alert and oriented to person, place, and time.     Deep Tendon Reflexes: Reflexes are normal and symmetric.  Psychiatric:        Behavior: Behavior normal.        Assessment & Plan:  George Garner is a 68 y.o. male . Annual physical exam  - -  anticipatory guidance as below in AVS, screening labs above. Health maintenance items as above in HPI discussed/recommended as applicable.   Nasal congestion  - Allergies stable with meds above, no changes  Need for influenza vaccination - Plan: Flu vaccine HIGH DOSE PF(Fluzone Trivalent)  Insomnia, unspecified type - Plan: Ambulatory referral to Sleep Studies  - See above primarily difficulty with early awakening.  Excellent CPAP use per most recent download.  Has tried various options over-the-counter.  Decided to hold on prescription meds for now.  He may try the Gummies over-the-counter, alternating with the over-the-counter sleep aid but that causes some daytime somnolence.  Does request a second opinion due to lack of deep sleep home monitoring, and persistent symptoms above.  Referral placed.  Dyslipidemia  - Tolerating rosuvastatin , check labs and adjust plan accordingly.  OSA on CPAP  - Excellent compliance, continue same, sleep specialist eval as above  Prediabetes  - Just under prediabetes level last visit.  Commended on his exercise, diet.  Check labs and adjust plan accordingly.  No orders of the  defined types were placed in this encounter.  Patient Instructions  Thanks for coming in today.  Sorry to hear about the difficulty with early awakening.  If you want to try some over-the-counter options and alternate those I think that would be reasonable to see if that is effective but I will also place a referral to meet with a different sleep specialist for second opinion.  CPAP use looks great, no changes there.  If any concerns on labs I will let you know.  No change in meds today.  Take care!  Preventive Care 69 Years and Older, Male Preventive care refers to lifestyle choices and visits with your health care provider that can promote health and wellness. Preventive care visits are also called wellness exams. What can I expect for my preventive care visit? Counseling During your preventive care visit, your health care provider may ask about your: Medical history, including: Past medical problems. Family medical history. History of falls. Current health, including: Emotional well-being. Home life and relationship well-being. Sexual activity. Memory and ability to understand (cognition). Lifestyle, including: Alcohol, nicotine or tobacco, and drug use. Access to firearms. Diet, exercise, and sleep habits. Work and work Astronomer. Sunscreen use. Safety issues such as seatbelt and bike helmet use. Physical exam Your health care provider will check your: Height and weight. These may be used to calculate your BMI (body mass index). BMI is a measurement that tells if you are at a healthy weight. Waist circumference. This measures the distance around your waistline. This measurement also tells if you are at a healthy weight and may help predict your risk of certain diseases, such as type 2 diabetes and high blood pressure. Heart rate and blood pressure. Body temperature. Skin for abnormal spots. What immunizations do I need?  Vaccines are usually given at various ages, according  to a schedule. Your health care provider will recommend vaccines for you based on your age, medical history, and lifestyle or other factors, such as travel or where you work. What tests do I need? Screening Your health care provider may recommend screening tests for certain conditions. This may include: Lipid and cholesterol levels. Diabetes screening. This is done by checking your blood sugar (glucose) after you have not eaten for a while (fasting). Hepatitis C test. Hepatitis B test. HIV (human immunodeficiency virus) test. STI (sexually transmitted infection) testing, if you are at risk. Lung cancer screening. Colorectal cancer screening.  Prostate cancer screening. Abdominal aortic aneurysm (AAA) screening. You may need this if you are a current or former smoker. Talk with your health care provider about your test results, treatment options, and if necessary, the need for more tests. Follow these instructions at home: Eating and drinking  Eat a diet that includes fresh fruits and vegetables, whole grains, lean protein, and low-fat dairy products. Limit your intake of foods with high amounts of sugar, saturated fats, and salt. Take vitamin and mineral supplements as recommended by your health care provider. Do not drink alcohol if your health care provider tells you not to drink. If you drink alcohol: Limit how much you have to 0-2 drinks a day. Know how much alcohol is in your drink. In the [place] one drink equals one 12 oz bottle of beer (355 mL), one 5 oz glass of wine (148 mL), or one 1 oz glass of hard liquor (44 mL). Lifestyle Brush your teeth every morning and night with fluoride toothpaste. Floss one time each day. Exercise for at least 30 minutes 5 or more days each week. Do not use any products that contain nicotine or tobacco. These products include cigarettes, chewing tobacco, and vaping devices, such as e-cigarettes. If you need help quitting, ask your health care  provider. Do not use drugs. If you are sexually active, practice safe sex. Use a condom or other form of protection to prevent STIs. Take aspirin only as told by your health care provider. Make sure that you understand how much to take and what form to take. Work with your health care provider to find out whether it is safe and beneficial for you to take aspirin daily. Ask your health care provider if you need to take a cholesterol-lowering medicine (statin). Find healthy ways to manage stress, such as: Meditation, yoga, or listening to music. Journaling. Talking to a trusted person. Spending time with friends and family. Safety Always wear your seat belt while driving or riding in a vehicle. Do not drive: If you have been drinking alcohol. Do not ride with someone who has been drinking. When you are tired or distracted. While texting. If you have been using any mind-altering substances or drugs. Wear a helmet and other protective equipment during sports activities. If you have firearms in your house, make sure you follow all gun safety procedures. Minimize exposure to UV radiation to reduce your risk of skin cancer. What's next? Visit your health care provider once a year for an annual wellness visit. Ask your health care provider how often you should have your eyes and teeth checked. Stay up to date on all vaccines. This information is not intended to replace advice given to you by your health care provider. Make sure you discuss any questions you have with your health care provider. Document Revised: 01/14/2021 Document Reviewed: 01/14/2021 Elsevier Patient Education  2024 Elsevier Inc.    Signed,   Reyes Pines, MD [facility], [facility] 04/16/24 8:36 AM

## 2024-04-17 ENCOUNTER — Ambulatory Visit: Payer: Self-pay | Admitting: Family Medicine

## 2024-04-25 ENCOUNTER — Other Ambulatory Visit (HOSPITAL_BASED_OUTPATIENT_CLINIC_OR_DEPARTMENT_OTHER): Payer: Self-pay

## 2024-04-25 ENCOUNTER — Encounter (HOSPITAL_BASED_OUTPATIENT_CLINIC_OR_DEPARTMENT_OTHER): Payer: Self-pay

## 2024-04-25 ENCOUNTER — Ambulatory Visit (INDEPENDENT_AMBULATORY_CARE_PROVIDER_SITE_OTHER)

## 2024-04-25 VITALS — BP 129/62 | HR 85 | Ht 72.0 in | Wt 238.0 lb

## 2024-04-25 DIAGNOSIS — Z87891 Personal history of nicotine dependence: Secondary | ICD-10-CM | POA: Diagnosis not present

## 2024-04-25 DIAGNOSIS — G4700 Insomnia, unspecified: Secondary | ICD-10-CM

## 2024-04-25 DIAGNOSIS — G4733 Obstructive sleep apnea (adult) (pediatric): Secondary | ICD-10-CM

## 2024-04-25 MED ORDER — ZOLPIDEM TARTRATE 5 MG PO TABS
5.0000 mg | ORAL_TABLET | Freq: Every evening | ORAL | 1 refills | Status: AC | PRN
  Filled 2024-04-25: qty 30, 30d supply, fill #0
  Filled 2024-06-18: qty 30, 30d supply, fill #1

## 2024-04-25 NOTE — Patient Instructions (Addendum)
 Continue CPAP nightly on current settings.  Start taking Ambien  5 mg at night as needed for sleep.  May increase to 10 mg (two tabs) pending response.  Hold other sleep aids while taking Ambien .  Plan for follow up in clinic in 2 months.

## 2024-04-25 NOTE — Progress Notes (Signed)
 Epworth Sleepiness Scale  Use the following scale to choose the most appropriate number for each situation. 0 Would never nod off 1  Slight  chance of nodding off 2 Moderate chance of nodding off 3 High chance of nodding off  Sitting and reading: 3 Watching TV: 3 Sitting, inactive, in a public place (e.g., in a meeting, theater, or dinner event): 3 As a passenger in a car for an hour or more without stopping for a break: 1 Lying down to rest when circumstances permit:1 Sitting and talking to someone: 1 Sitting quietly after a meal without alcohol: 2 In a car, while stopped for a few  minutes in traffic or at a light: 0  TOTOAL: 14

## 2024-04-25 NOTE — Progress Notes (Signed)
 @Patient  ID: George Garner, male    DOB: 16-Apr-1956, 68 y.o.   MRN: [id number]  No chief complaint on file.   Referring provider: Levora Reyes SAUNDERS, MD  HPI: George Garner is a 68 y/o male with PMH of GERD, seasonal allergies, and OSA on CPAP previously followed by Neurology who presents today as new patient for evaluation of sleep.  He reports that he was originally diagnosed with OSA in 2000, but began using his CPAP nightly since ~2010.  Follow up sleep study was completed in May of 2024 as a means to replace his machine that was over 35 years old.  This demonstrated an AHI of 25.6/hr, supine sleep AHI of 80/hr and O2 sat nadir of 78%.  His current machine is less than one year old.  We reviewed his compliance download today and it demonstrates excellent compliance. Residual AHI of 0.8/hr and an acceptable leak profile.  He wears it every night.  His main c/o today is that he has trouble staying asleep.  He states that this has been going on for years, but has really been bothering him lately.  He goes to bed between 10 and 11 PM every night and reports that he wakes up around 6 AM every day.  He has no trouble falling asleep but does wake up at least once in the night.  He states that sometimes that is not an issue if he wakes up in the middle of the night he is then able to fall back asleep.  If he wakes up anytime after 2 AM he is unable to fall asleep again and therefore is up for the day.  He reports that he uses caffeine several cups in the morning but rarely uses caffeine in the afternoons and definitely not after 5 PM every day.  He reports continued daytime sleepiness despite the use of CPAP and he attributes this to waking up at night.  Notably his Epworth today is 14.  He states that he has tried alternating Benadryl  with melatonin and THC Gummies at night.  He feels that if he takes any of these for a long period of time that they stop working.  He is reticent to start taking nightly  medications for this but does want to get some sleep.  TEST/EVENTS : 12/22/2022 HST:  AHI 25.6/hr; supine sleep AHI 80/hr and O2 sat nadir 78% Epworth score today:  14 Allergies  Allergen Reactions   Penicillins Rash    Teen Years    Immunization History  Administered Date(s) Administered   Fluad Quad(high Dose 65+) 04/10/2021, 05/27/2022   Fluad Trivalent(High Dose 65+) 04/13/2023   INFLUENZA, HIGH DOSE SEASONAL PF 04/16/2024   Influenza Inj Mdck Quad Pf 08/12/2017   Influenza Whole 04/29/2011   Influenza,inj,Quad PF,6+ Mos 05/13/2014, 06/23/2015, 07/02/2016, 08/12/2017, 05/26/2018, 04/04/2019   Influenza-Unspecified 05/01/2021   PFIZER Comirnaty(Gray Top)Covid-19 Tri-Sucrose Vaccine 09/21/2019, 10/15/2019, 05/09/2020, 02/13/2021   Pneumococcal Conjugate-13 08/29/2020   Pneumococcal Polysaccharide-23 10/01/2021   Tdap 06/08/2006, 07/02/2016, 01/22/2022   Unspecified SARS-COV-2 Vaccination 05/27/2022   Zoster Recombinant(Shingrix ) 02/13/2021, 06/05/2021   Zoster, Live 08/03/2011    Past Medical History:  Diagnosis Date   Allergy    Zyrtec  daily.  Flonase  PRN.   Arthritis minor in left knee   BPH (benign prostatic hyperplasia)    Dyslipidemia    Erectile dysfunction    GERD (gastroesophageal reflux disease)    Hyperlipidemia    Hypogonadism male    Obstructive sleep apnea  OSA on CPAP    Sleep apnea     Tobacco History: Social History   Tobacco Use  Smoking Status Former   Current packs/day: 0.00   Types: Cigarettes   Quit date: 2002   Years since quitting: 23.7  Smokeless Tobacco Never   Counseling given: Not Answered   Outpatient Medications Prior to Visit  Medication Sig Dispense Refill   acetaminophen  (TYLENOL ) 500 MG tablet Take 500 mg by mouth every 6 (six) hours as needed for mild pain or headache.     aspirin EC 81 MG tablet Take 81 mg by mouth daily. Swallow whole.     cetirizine  (ZYRTEC ) 10 MG tablet Take 1 tablet (10 mg total) by mouth daily.  (Patient taking differently: Take 10 mg by mouth at bedtime.) 30 tablet 1   diphenhydrAMINE  (BENADRYL ) 25 mg capsule Take 25 mg by mouth at bedtime as needed.     fluticasone  (FLONASE ) 50 MCG/ACT nasal spray Place 2 sprays into both nostrils daily. 16 g 1   ipratropium (ATROVENT ) 0.06 % nasal spray Place 1-2 sprays into both nostrils 4 (four) times daily. As needed for nasal congestion 15 mL 5   montelukast  (SINGULAIR ) 10 MG tablet Take 1 tablet (10 mg total) by mouth daily. 90 tablet 3   Multiple Vitamin (MULTI-VITAMIN) tablet Take 1 tablet by mouth daily.     rosuvastatin  (CRESTOR ) 10 MG tablet Take 1 tablet (10 mg total) by mouth daily. 90 tablet 3   No facility-administered medications prior to visit.     Review of Systems: positive as per HPI  Constitutional:   No  weight loss, night sweats,  Fevers, chills, fatigue, or  lassitude.  HEENT:   No headaches,  Difficulty swallowing,  Tooth/dental problems, or  Sore throat,                No sneezing, itching, ear ache, nasal congestion, post nasal drip, Mallampati II  CV:  No chest pain,  Orthopnea, PND, swelling in lower extremities, anasarca, dizziness, palpitations, syncope.   GI  No heartburn, indigestion, abdominal pain, nausea, vomiting, diarrhea, change in bowel habits, loss of appetite, bloody stools.   Resp: No shortness of breath with exertion or at rest.  No excess mucus, no productive cough,  No non-productive cough,  No coughing up of blood.  No change in color of mucus.  No wheezing.  No chest wall deformity  Skin: no rash or lesions.  GU: no dysuria, change in color of urine, no urgency or frequency.  No flank pain, no hematuria   MS:  No joint pain or swelling.  No decreased range of motion.  No back pain.    Physical Exam  BP 129/62   Pulse 85   Ht 6' (1.829 m)   Wt 238 lb (108 kg)   SpO2 94%   BMI 32.28 kg/m   GEN: A/Ox3; pleasant , NAD, well nourished    HEENT:  [place]/AT,  EACs-clear, TMs-wnl, NOSE-clear,  THROAT-clear, no lesions, no postnasal drip or exudate noted.   NECK:  Supple w/ fair ROM; no JVD; normal carotid impulses w/o bruits; no thyromegaly or nodules palpated; no lymphadenopathy.    RESP  Clear  P & A; w/o, wheezes/ rales/ or rhonchi. no accessory muscle use, no dullness to percussion  CARD:  RRR, no m/r/g, no peripheral edema, pulses intact, no cyanosis or clubbing.  GI:   Soft & nt; nml bowel sounds; no organomegaly or masses detected.   Musco: Warm bil,  no deformities or joint swelling noted.   Neuro: alert, no focal deficits noted.    Skin: Warm, no lesions or rashes    Lab Results:  CBC    Component Value Date/Time   WBC 5.2 08/29/2020 1159   WBC 4.8 01/02/2020 1021   RBC 4.90 08/29/2020 1159   RBC 4.78 01/02/2020 1021   HGB 14.8 08/29/2020 1159   HCT 44.7 08/29/2020 1159   PLT 182 08/29/2020 1159   MCV 91 08/29/2020 1159   MCH 30.2 08/29/2020 1159   MCH 29.5 01/02/2020 1021   MCHC 33.1 08/29/2020 1159   MCHC 31.6 01/02/2020 1021   RDW 12.7 08/29/2020 1159   LYMPHSABS 1.2 05/13/2014 1429   MONOABS 0.5 05/13/2014 1429   EOSABS 0.3 05/13/2014 1429   BASOSABS 0.0 05/13/2014 1429    BMET    Component Value Date/Time   NA 140 04/16/2024 0845   NA 141 08/29/2020 1159   K 4.2 04/16/2024 0845   CL 105 04/16/2024 0845   CO2 29 04/16/2024 0845   GLUCOSE 97 04/16/2024 0845   BUN 22 04/16/2024 0845   BUN 26 08/29/2020 1159   CREATININE 1.09 04/16/2024 0845   CREATININE 1.13 07/02/2016 1650   CALCIUM  9.4 04/16/2024 0845   GFRNONAA 66 08/29/2020 1159   GFRNONAA 70 07/02/2016 1650   GFRAA 76 08/29/2020 1159   GFRAA 81 07/02/2016 1650    BNP No results found for: BNP  ProBNP No results found for: PROBNP  Imaging: No results found.  Administration History     None           No data to display          No results found for: NITRICOXIDE   Assessment & Plan:   Assessment & Plan OSA (obstructive sleep apnea) Continue CPAP  nightly on current settings; excellent compliance. Insomnia, unspecified type No caffeine past 5 pm  Continue sleep hygiene  Trial of Ambien  5 mg at night as needed for sleep.  May increase to 10 mg (two tabs) pending response.  Hold other sleep aids while taking Ambien .  Plan for follow up in clinic in 2 months.   Return in about 2 months (around 06/25/2024).  Candis Dandy, PA-C 04/25/2024

## 2024-05-31 ENCOUNTER — Telehealth: Payer: Medicare Other | Admitting: Adult Health

## 2024-06-04 ENCOUNTER — Encounter: Payer: Self-pay | Admitting: Radiology

## 2024-06-18 ENCOUNTER — Other Ambulatory Visit: Payer: Self-pay

## 2024-06-18 ENCOUNTER — Other Ambulatory Visit (HOSPITAL_BASED_OUTPATIENT_CLINIC_OR_DEPARTMENT_OTHER): Payer: Self-pay

## 2024-06-25 ENCOUNTER — Ambulatory Visit (HOSPITAL_BASED_OUTPATIENT_CLINIC_OR_DEPARTMENT_OTHER)

## 2024-06-25 ENCOUNTER — Encounter (HOSPITAL_BASED_OUTPATIENT_CLINIC_OR_DEPARTMENT_OTHER): Payer: Self-pay

## 2024-06-25 VITALS — BP 138/74 | HR 79 | Wt 251.1 lb

## 2024-06-25 DIAGNOSIS — G4733 Obstructive sleep apnea (adult) (pediatric): Secondary | ICD-10-CM

## 2024-06-25 DIAGNOSIS — G4700 Insomnia, unspecified: Secondary | ICD-10-CM

## 2024-06-25 NOTE — Progress Notes (Signed)
 @Patient  ID: George Garner, male    DOB: 1956-08-01, 68 y.o.   MRN: [id number]  Chief Complaint  Patient presents with   Sleep Apnea    Trouble sleeping    Referring provider: Levora Reyes SAUNDERS, MD  HPI: Discussed the use of AI scribe software for clinical note transcription with the patient, who gave verbal consent to proceed.  History of Present Illness George Garner is a 68 year old male who presents for follow-up on his sleep regimen.  He has experienced improvement in sleep duration, now achieving seven to seven and a half hours of sleep per night. However, he lacks deep sleep and experiences some daytime drowsiness, particularly when watching TV at night, which sometimes leads to brief naps.  He continues to use his CPAP machine without issues, and his sleep app indicates his events are down to 0.2. Despite this, he reports only achieving 3-5% deep sleep.  He has been alternating between Ambien  and over-the-counter sleep aids like Benadryl  and Z-Quil. Initially, he found no effect from Ambien  but noted improvement after increasing the dose to two pills. He also incorporates a melatonin gummy and half a THC gummy into his regimen, which he finds effective.  He recently added chamomile tea to his nighttime routine. He has experimented with Rise mushroom coffee but has not found it effective for improving sleep.  Last OV 04/25/2024: George CANDIE Garner Glendia is a 68 y/o male with PMH of GERD, seasonal allergies, and OSA on CPAP previously followed by Neurology who presents today as new patient for evaluation of sleep.  He reports that he was originally diagnosed with OSA in 2000, but began using his CPAP nightly since ~2010.  Follow up sleep study was completed in May of 2024 as a means to replace his machine that was over 70 years old.  This demonstrated an AHI of 25.6/hr, supine sleep AHI of 80/hr and O2 sat nadir of 78%.  His current machine is less than one year old.  We reviewed  his compliance download today and it demonstrates excellent compliance. Residual AHI of 0.8/hr and an acceptable leak profile.  He wears it every night.  His main c/o today is that he has trouble staying asleep.  He states that this has been going on for years, but has really been bothering him lately.  He goes to bed between 10 and 11 PM every night and reports that he wakes up around 6 AM every day.  He has no trouble falling asleep but does wake up at least once in the night.  He states that sometimes that is not an issue if he wakes up in the middle of the night he is then able to fall back asleep.  If he wakes up anytime after 2 AM he is unable to fall asleep again and therefore is up for the day.  He reports that he uses caffeine several cups in the morning but rarely uses caffeine in the afternoons and definitely not after 5 PM every day.  He reports continued daytime sleepiness despite the use of CPAP and he attributes this to waking up at night.  Notably his Epworth today is 14.  He states that he has tried alternating Benadryl  with melatonin and THC Gummies at night.  He feels that if he takes any of these for a long period of time that they stop working.  He is reticent to start taking nightly medications for this but does want to  get some sleep.   TEST/EVENTS : 12/22/2022 HST:  AHI 25.6/hr; supine sleep AHI 80/hr and O2 sat nadir 78% Epworth score today:  14    Allergies  Allergen Reactions   Penicillins Rash    Teen Years    Immunization History  Administered Date(s) Administered   Fluad Quad(high Dose 65+) 04/10/2021, 05/27/2022   Fluad Trivalent(High Dose 65+) 04/13/2023   INFLUENZA, HIGH DOSE SEASONAL PF 04/16/2024   Influenza Inj Mdck Quad Pf 08/12/2017   Influenza Whole 04/29/2011   Influenza,inj,Quad PF,6+ Mos 05/13/2014, 06/23/2015, 07/02/2016, 08/12/2017, 05/26/2018, 04/04/2019   Influenza-Unspecified 05/01/2021   PFIZER Comirnaty(Gray Top)Covid-19 Tri-Sucrose Vaccine  09/21/2019, 10/15/2019, 05/09/2020, 02/13/2021   Pneumococcal Conjugate-13 08/29/2020   Pneumococcal Polysaccharide-23 10/01/2021   Tdap 06/08/2006, 07/02/2016, 01/22/2022   Unspecified SARS-COV-2 Vaccination 05/27/2022   Zoster Recombinant(Shingrix ) 02/13/2021, 06/05/2021   Zoster, Live 08/03/2011    Past Medical History:  Diagnosis Date   Allergy    Zyrtec  daily.  Flonase  PRN.   Arthritis minor in left knee   BPH (benign prostatic hyperplasia)    Dyslipidemia    Erectile dysfunction    GERD (gastroesophageal reflux disease)    Hyperlipidemia    Hypogonadism male    Obstructive sleep apnea    OSA on CPAP    Sleep apnea     Tobacco History: Social History   Tobacco Use  Smoking Status Former   Current packs/day: 0.00   Types: Cigarettes   Quit date: 2002   Years since quitting: 23.9  Smokeless Tobacco Never   Counseling given: Not Answered   Outpatient Medications Prior to Visit  Medication Sig Dispense Refill   acetaminophen  (TYLENOL ) 500 MG tablet Take 500 mg by mouth every 6 (six) hours as needed for mild pain or headache.     aspirin EC 81 MG tablet Take 81 mg by mouth daily. Swallow whole.     cetirizine  (ZYRTEC ) 10 MG tablet Take 1 tablet (10 mg total) by mouth daily. (Patient taking differently: Take 10 mg by mouth at bedtime.) 30 tablet 1   diphenhydrAMINE  (BENADRYL ) 25 mg capsule Take 25 mg by mouth at bedtime as needed.     fluticasone  (FLONASE ) 50 MCG/ACT nasal spray Place 2 sprays into both nostrils daily. 16 g 1   ipratropium (ATROVENT ) 0.06 % nasal spray Place 1-2 sprays into both nostrils 4 (four) times daily. As needed for nasal congestion 15 mL 5   montelukast  (SINGULAIR ) 10 MG tablet Take 1 tablet (10 mg total) by mouth daily. 90 tablet 3   Multiple Vitamin (MULTI-VITAMIN) tablet Take 1 tablet by mouth daily.     rosuvastatin  (CRESTOR ) 10 MG tablet Take 1 tablet (10 mg total) by mouth daily. 90 tablet 3   zolpidem  (AMBIEN ) 5 MG tablet Take 1 tablet  (5 mg total) by mouth at bedtime as needed for sleep (start with one tab at night as needed; may increase to two tabs at night as needed). 30 tablet 1   No facility-administered medications prior to visit.     Review of Systems: as per hpi  Constitutional:   No  weight loss, night sweats,  Fevers, chills, fatigue, or  lassitude.  HEENT:   No headaches,  Difficulty swallowing,  Tooth/dental problems, or  Sore throat,                No sneezing, itching, ear ache, nasal congestion, post nasal drip,   CV:  No chest pain,  Orthopnea, PND, swelling in lower extremities, anasarca, dizziness, palpitations,  syncope.   GI  No heartburn, indigestion, abdominal pain, nausea, vomiting, diarrhea, change in bowel habits, loss of appetite, bloody stools.   Resp: No shortness of breath with exertion or at rest.  No excess mucus, no productive cough,  No non-productive cough,  No coughing up of blood.  No change in color of mucus.  No wheezing.  No chest wall deformity  Skin: no rash or lesions.  GU: no dysuria, change in color of urine, no urgency or frequency.  No flank pain, no hematuria   MS:  No joint pain or swelling.  No decreased range of motion.  No back pain.    Physical Exam  BP 138/74   Pulse 79   Wt 251 lb 1.6 oz (113.9 kg)   SpO2 94%   BMI 34.06 kg/m   GEN: A/Ox3; pleasant , NAD, well nourished    HEENT:  /AT,  EACs-clear, TMs-wnl, NOSE-clear, THROAT-clear, no lesions, no postnasal drip or exudate noted.   NECK:  Supple w/ fair ROM; no JVD; normal carotid impulses w/o bruits; no thyromegaly or nodules palpated; no lymphadenopathy.    RESP  Clear  P & A; w/o, wheezes/ rales/ or rhonchi. no accessory muscle use, no dullness to percussion  CARD:  RRR, no m/r/g, no peripheral edema, pulses intact, no cyanosis or clubbing.  GI:   Soft & nt; nml bowel sounds; no organomegaly or masses detected.   Musco: Warm bil, no deformities or joint swelling noted.   Neuro: alert, no  focal deficits noted.    Skin: Warm, no lesions or rashes    Lab Results:  CBC    Component Value Date/Time   WBC 5.2 08/29/2020 1159   WBC 4.8 01/02/2020 1021   RBC 4.90 08/29/2020 1159   RBC 4.78 01/02/2020 1021   HGB 14.8 08/29/2020 1159   HCT 44.7 08/29/2020 1159   PLT 182 08/29/2020 1159   MCV 91 08/29/2020 1159   MCH 30.2 08/29/2020 1159   MCH 29.5 01/02/2020 1021   MCHC 33.1 08/29/2020 1159   MCHC 31.6 01/02/2020 1021   RDW 12.7 08/29/2020 1159   LYMPHSABS 1.2 05/13/2014 1429   MONOABS 0.5 05/13/2014 1429   EOSABS 0.3 05/13/2014 1429   BASOSABS 0.0 05/13/2014 1429    BMET    Component Value Date/Time   NA 140 04/16/2024 0845   NA 141 08/29/2020 1159   K 4.2 04/16/2024 0845   CL 105 04/16/2024 0845   CO2 29 04/16/2024 0845   GLUCOSE 97 04/16/2024 0845   BUN 22 04/16/2024 0845   BUN 26 08/29/2020 1159   CREATININE 1.09 04/16/2024 0845   CREATININE 1.13 07/02/2016 1650   CALCIUM  9.4 04/16/2024 0845   GFRNONAA 66 08/29/2020 1159   GFRNONAA 70 07/02/2016 1650   GFRAA 76 08/29/2020 1159   GFRAA 81 07/02/2016 1650    BNP No results found for: BNP  ProBNP No results found for: PROBNP  Imaging: No results found.  Administration History     None           No data to display          No results found for: NITRICOXIDE   Assessment & Plan:   Assessment & Plan OSA (obstructive sleep apnea)  Insomnia, unspecified type  Assessment and Plan Assessment & Plan Insomnia Improving with current regimen. Reports 7-7.5 hours of sleep per night with some daytime drowsiness and occasional nighttime awakenings. Chamomile tea addition may reduce awakenings. Mushroom coffee ineffective. - Continue  current sleep regimen including Ambien , over-the-counter sleep aids, melatonin, THC gummies, and add chamomile tea.  Obstructive sleep apnea Well-managed with CPAP therapy. Consistent use with over seven hours per night and minimal events. -  Continue CPAP therapy as currently prescribed. - Scheduled follow-up in one year unless issues arise.    Return in about 1 year (around 06/25/2025).  Candis Dandy, PA-C 06/25/2024

## 2024-06-25 NOTE — Patient Instructions (Signed)
 Continue to wear CPAP nightly; goal of at least 4-6 hours or more as tolerated.   Continue current sleep regimen.  Follow up in one year; sooner if new or worsening symptoms.

## 2024-07-29 ENCOUNTER — Other Ambulatory Visit: Payer: Self-pay | Admitting: Family Medicine

## 2024-07-30 ENCOUNTER — Other Ambulatory Visit: Payer: Self-pay

## 2024-07-30 ENCOUNTER — Other Ambulatory Visit (HOSPITAL_BASED_OUTPATIENT_CLINIC_OR_DEPARTMENT_OTHER): Payer: Self-pay

## 2024-07-30 MED ORDER — ROSUVASTATIN CALCIUM 10 MG PO TABS
10.0000 mg | ORAL_TABLET | Freq: Every day | ORAL | 3 refills | Status: AC
  Filled 2024-07-30: qty 90, 90d supply, fill #0

## 2024-08-24 ENCOUNTER — Other Ambulatory Visit (HOSPITAL_BASED_OUTPATIENT_CLINIC_OR_DEPARTMENT_OTHER): Payer: Self-pay

## 2024-08-24 ENCOUNTER — Ambulatory Visit
Admission: RE | Admit: 2024-08-24 | Discharge: 2024-08-24 | Disposition: A | Discharge status: Home / Self Care | Source: Ambulatory Visit | Attending: Family Medicine

## 2024-08-24 VITALS — BP 169/72 | HR 67 | Temp 97.5°F | Resp 15

## 2024-08-24 DIAGNOSIS — S61001A Unspecified open wound of right thumb without damage to nail, initial encounter: Secondary | ICD-10-CM

## 2024-08-24 MED ORDER — CEPHALEXIN 500 MG PO CAPS
500.0000 mg | ORAL_CAPSULE | Freq: Three times a day (TID) | ORAL | 0 refills | Status: AC
  Filled 2024-08-24: qty 15, 5d supply, fill #0

## 2024-08-24 NOTE — ED Triage Notes (Signed)
 Pt present with an injury to the rt thumb. Pt states he got stupid with a Mandelin and cut the top of his thumb off. States this happened on Monday night.

## 2024-08-24 NOTE — ED Provider Notes (Signed)
 " [facility]    CSN: [id number] Arrival date & time: 08/24/24  1328      History   Chief Complaint Chief Complaint  Patient presents with   Hand Problem    Cut tip of thumb off - Entered by patient    HPI George Garner is a 69 y.o. male presents for finger injury.  Patient reports 5 days ago he was using a mandolin in his kitchen and cut the skin of his right thumb.  States he has been cleaning it and keeping it dressed but noticed some bleeding and prompting him to come in today.  He takes 81 mg of aspirin otherwise no blood thinners.  He is up-to-date on his tetanus.  No numbness or tingling.  No other concerns.  HPI  Past Medical History:  Diagnosis Date   Allergy    Zyrtec  daily.  Flonase  PRN.   Arthritis minor in left knee   BPH (benign prostatic hyperplasia)    Dyslipidemia    Erectile dysfunction    GERD (gastroesophageal reflux disease)    Hyperlipidemia    Hypogonadism male    Obstructive sleep apnea    OSA on CPAP    Sleep apnea     Patient Active Problem List   Diagnosis Date Noted   OSA on CPAP 12/01/2023   Class 1 obesity due to excess calories without serious comorbidity with body mass index (BMI) of 34.0 to 34.9 in adult 11/23/2022   Prostate cancer (HCC) 01/10/2020   Fatigue 05/24/2018   Somnolence, daytime 05/24/2018   Obstructive sleep apnea treated with continuous positive airway pressure (CPAP) 01/31/2018   Snoring 09/28/2017   UARS (upper airway resistance syndrome) 09/28/2017   Retrognathia 09/28/2017   Allergic rhinitis due to allergen 09/28/2017   Obesity (BMI 30.0-34.9) 09/28/2017   Other seasonal allergic rhinitis 05/24/2014   Dyslipidemia    HYPOGODADISM MALE     Past Surgical History:  Procedure Laterality Date   CHOLECYSTECTOMY     COLONOSCOPY     deviated septum-cyst     EYE SURGERY     L eye tumor benign.   GALLBLADDER SURGERY     INGUINAL HERNIA REPAIR N/A 01/10/2020   Procedure: HERNIA REPAIR UMBILICAL, ADULT;   Surgeon: Renda Glance, MD;  Location: [facility];  Service: Urology;  Laterality: N/A;   LYMPHADENECTOMY Bilateral 01/10/2020   Procedure: LYMPHADENECTOMY, PELVIC;  Surgeon: Renda Glance, MD;  Location: [facility];  Service: Urology;  Laterality: Bilateral;   PILONIDAL CYST EXCISION     PROSTATE SURGERY N/A    [employer] 08/26/2020   ROBOT ASSISTED LAPAROSCOPIC RADICAL PROSTATECTOMY N/A 01/10/2020   Procedure: XI ROBOTIC ASSISTED LAPAROSCOPIC RADICAL PROSTATECTOMY LEVEL 2;  Surgeon: Renda Glance, MD;  Location: [facility];  Service: Urology;  Laterality: N/A;   VASECTOMY         Home Medications    Prior to Admission medications  Medication Sig Start Date End Date Taking? Authorizing Provider  cephALEXin (KEFLEX) 500 MG capsule Take 1 capsule (500 mg total) by mouth 3 (three) times daily for 5 days. 08/24/24 08/29/24 Yes Morrell Fluke, Jodi R, NP  acetaminophen  (TYLENOL ) 500 MG tablet Take 500 mg by mouth every 6 (six) hours as needed for mild pain or headache.    [provider]  aspirin EC 81 MG tablet Take 81 mg by mouth daily. Swallow whole.    [provider]  cetirizine  (ZYRTEC ) 10 MG tablet Take 1 tablet (10 mg total) by mouth daily. Patient  taking differently: Take 10 mg by mouth at bedtime. 12/10/16   McVey, Almarie Folks, PA-C  diphenhydrAMINE  (BENADRYL ) 25 mg capsule Take 25 mg by mouth at bedtime as needed.    [provider]  fluticasone  (FLONASE ) 50 MCG/ACT nasal spray Place 2 sprays into both nostrils daily. 12/10/16   McVey, Almarie Folks, PA-C  ipratropium (ATROVENT ) 0.06 % nasal spray Place 1-2 sprays into both nostrils 4 (four) times daily. As needed for nasal congestion 08/17/21   Levora Reyes SAUNDERS, MD  montelukast  (SINGULAIR ) 10 MG tablet Take 1 tablet (10 mg total) by mouth daily. 02/06/24   Levora Reyes SAUNDERS, MD  Multiple Vitamin (MULTI-VITAMIN) tablet Take 1 tablet by mouth daily. 01/13/16   [provider]  rosuvastatin  (CRESTOR ) 10 MG tablet  Take 1 tablet (10 mg total) by mouth daily. 07/30/24 07/25/25  Levora Reyes SAUNDERS, MD  zolpidem  (AMBIEN ) 5 MG tablet Take 1 tablet (5 mg total) by mouth at bedtime as needed for sleep (start with one tab at night as needed; may increase to two tabs at night as needed). 04/25/24   Charley Conger, PA-C    Family History Family History  Problem Relation Age of Onset   Diabetes Mother        deceased   Dementia Mother    Arthritis Father    Hearing loss Father    Arthritis Brother    Hearing loss Brother    Colon cancer Neg Hx    Colon polyps Neg Hx    Esophageal cancer Neg Hx    Stomach cancer Neg Hx    Rectal cancer Neg Hx     Social History Social History[1]   Allergies   Penicillins   Review of Systems Review of Systems  Skin:  Positive for wound.     Physical Exam Triage Vital Signs ED Triage Vitals  Encounter Vitals Group     BP 08/24/24 1336 (!) 169/72     Girls Systolic BP Percentile --      Girls Diastolic BP Percentile --      Boys Systolic BP Percentile --      Boys Diastolic BP Percentile --      Pulse Rate 08/24/24 1336 67     Resp 08/24/24 1336 15     Temp 08/24/24 1336 (!) 97.5 F (36.4 C)     Temp src --      SpO2 08/24/24 1336 97 %     Weight --      Height --      Head Circumference --      Peak Flow --      Pain Score 08/24/24 1335 0     Pain Loc --      Pain Education --      Exclude from Growth Chart --    No data found.  Updated Vital Signs BP (!) 169/72   Pulse 67   Temp (!) 97.5 F (36.4 C)   Resp 15   SpO2 97%   Visual Acuity Right Eye Distance:   Left Eye Distance:   Bilateral Distance:    Right Eye Near:   Left Eye Near:    Bilateral Near:     Physical Exam Vitals and nursing note reviewed.  Constitutional:      General: He is not in acute distress.    Appearance: Normal appearance. He is not ill-appearing.  HENT:     Head: Normocephalic and atraumatic.  Eyes:     Pupils: Pupils are  equal, round, and reactive to  light.  Cardiovascular:     Rate and Rhythm: Normal rate.  Pulmonary:     Effort: Pulmonary effort is normal.  Musculoskeletal:       Hands:     Comments: There is a small avulsion to the distal lateral aspect of the right thumb.  Nail is intact and adhered.  No bleeding.  No erythema warmth or drainage.  Cap refill +2  Skin:    General: Skin is warm and dry.  Neurological:     General: No focal deficit present.     Mental Status: He is alert and oriented to person, place, and time.  Psychiatric:        Mood and Affect: Mood normal.        Behavior: Behavior normal.      UC Treatments / Results  Labs (all labs ordered are listed, but only abnormal results are displayed) Labs Reviewed - No data to display  EKG   Radiology No results found.  Procedures Procedures (including critical care time)  Medications Ordered in UC Medications - No data to display  Initial Impression / Assessment and Plan / UC Course  I have reviewed the triage vital signs and the nursing notes.  Pertinent labs & imaging results that were available during my care of the patient were reviewed by me and considered in my medical decision making (see chart for details).     Wound was cleansed and dressed by nursing staff and splint applied for support/protection.  Do Keflex for infection prevention.  Discussed wound care as well as signs and symptoms of infection.  PCP follow-up 2 to 3 days for recheck.  ER precautions reviewed Final Clinical Impressions(s) / UC Diagnoses   Final diagnoses:  Avulsion of skin of right thumb, initial encounter     Discharge Instructions      Keep the area clean and dry.  Start Keflex 3 times a day for 5 days to prevent infection.  Monitor for any signs of redness, swelling, drainage and seek reevaluation in the emergency room if these occur.  Follow-up with PCP in 2 to 3 days for recheck.  Hope you feel better soon!    ED Prescriptions     Medication Sig  Dispense Auth. Provider   cephALEXin (KEFLEX) 500 MG capsule Take 1 capsule (500 mg total) by mouth 3 (three) times daily for 5 days. 15 capsule Zahi Plaskett, Jodi R, NP      PDMP not reviewed this encounter.    [1]  Social History Tobacco Use   Smoking status: Former    Current packs/day: 0.00    Types: Cigarettes    Quit date: 2002    Years since quitting: 24.0   Smokeless tobacco: Never  Vaping Use   Vaping status: Never Used  Substance Use Topics   Alcohol use: Yes    Alcohol/week: 14.0 standard drinks of alcohol    Types: 14 Cans of beer per week    Comment: 1-2 beers daily now hes retired   Drug use: Yes    Types: Other-see comments    Comment: THC gummies     Loreda Myla SAUNDERS, NP 08/24/24 1428  "

## 2024-08-24 NOTE — Discharge Instructions (Addendum)
 Keep the area clean and dry.  Start Keflex 3 times a day for 5 days to prevent infection.  Monitor for any signs of redness, swelling, drainage and seek reevaluation in the emergency room if these occur.  Follow-up with PCP in 2 to 3 days for recheck.  Hope you feel better soon!

## 2024-08-25 ENCOUNTER — Other Ambulatory Visit (HOSPITAL_BASED_OUTPATIENT_CLINIC_OR_DEPARTMENT_OTHER): Payer: Self-pay

## 2024-08-29 ENCOUNTER — Encounter: Payer: Self-pay | Admitting: Family Medicine

## 2024-08-29 ENCOUNTER — Ambulatory Visit: Admitting: Family Medicine

## 2024-08-29 VITALS — BP 118/64 | HR 64 | Temp 98.2°F | Resp 18 | Ht 72.0 in | Wt 251.6 lb

## 2024-08-29 DIAGNOSIS — S61001D Unspecified open wound of right thumb without damage to nail, subsequent encounter: Secondary | ICD-10-CM | POA: Diagnosis not present

## 2024-08-29 DIAGNOSIS — M7918 Myalgia, other site: Secondary | ICD-10-CM

## 2024-08-29 DIAGNOSIS — M25551 Pain in right hip: Secondary | ICD-10-CM | POA: Diagnosis not present

## 2024-08-29 NOTE — Patient Instructions (Addendum)
 Thanks for coming today.  It appears that the avulsion of the thumb is healing and should continue to do so, please keep that area clean with soap and water  and covered as needed.  If any surrounding redness or concerns with that area I am happy to see you.  Try adjusting your position throughout the day with the right arm, to see if that could be related to the upper deltoid/upper arm pain.  I did refer you to Ortho for the hip and can discuss arm pain with them as well at that time if still present.  Please have x-ray at the [facility] location below.  I will let you know if there are concerns, but have also referred you to orthopedics.  I am suspicious for some arthritis of that hip as cause of symptoms.  Tylenol  as needed for now.  Let me know if you have any questions or any new/worsening symptoms.  Follow-up as planned in March.  [facility] or xray: Walk in 8:30-4:30 during weekdays, no appointment needed [address]  [place], [place] [id number]

## 2024-08-31 ENCOUNTER — Ambulatory Visit: Payer: Self-pay | Admitting: Family Medicine

## 2024-08-31 ENCOUNTER — Ambulatory Visit
Admission: RE | Admit: 2024-08-31 | Discharge: 2024-08-31 | Disposition: A | Source: Ambulatory Visit | Attending: Family Medicine | Admitting: Family Medicine

## 2024-08-31 DIAGNOSIS — M25551 Pain in right hip: Secondary | ICD-10-CM | POA: Diagnosis not present

## 2024-09-14 ENCOUNTER — Ambulatory Visit: Admitting: Orthopedic Surgery

## 2024-10-15 ENCOUNTER — Ambulatory Visit: Admitting: Family Medicine

## 2024-11-13 ENCOUNTER — Encounter

## 2024-12-06 ENCOUNTER — Ambulatory Visit: Admitting: Adult Health
